# Patient Record
Sex: Male | Born: 1935 | Race: White | Hispanic: No | Smoking: Never smoker
Health system: Southern US, Community
[De-identification: ages and names within clinical notes are randomized; demographics above are authoritative.]

## PROBLEM LIST (undated history)

## (undated) DIAGNOSIS — M1990 Unspecified osteoarthritis, unspecified site: Secondary | ICD-10-CM

## (undated) DIAGNOSIS — I350 Nonrheumatic aortic (valve) stenosis: Secondary | ICD-10-CM

## (undated) DIAGNOSIS — I2510 Atherosclerotic heart disease of native coronary artery without angina pectoris: Secondary | ICD-10-CM

## (undated) HISTORY — DX: Unspecified osteoarthritis, unspecified site: M19.90

## (undated) HISTORY — PX: CHOLECYSTECTOMY: SHX55

## (undated) HISTORY — DX: Atherosclerotic heart disease of native coronary artery without angina pectoris: I25.10

## (undated) HISTORY — DX: Nonrheumatic aortic (valve) stenosis: I35.0

---

## 2011-05-20 DIAGNOSIS — H2510 Age-related nuclear cataract, unspecified eye: Secondary | ICD-10-CM | POA: Diagnosis not present

## 2011-05-25 DIAGNOSIS — Z86010 Personal history of colonic polyps: Secondary | ICD-10-CM | POA: Diagnosis not present

## 2011-05-25 DIAGNOSIS — K6289 Other specified diseases of anus and rectum: Secondary | ICD-10-CM | POA: Diagnosis not present

## 2011-07-10 DIAGNOSIS — S01309A Unspecified open wound of unspecified ear, initial encounter: Secondary | ICD-10-CM | POA: Diagnosis not present

## 2011-07-29 DIAGNOSIS — R5383 Other fatigue: Secondary | ICD-10-CM | POA: Diagnosis not present

## 2011-07-29 DIAGNOSIS — E7800 Pure hypercholesterolemia, unspecified: Secondary | ICD-10-CM | POA: Diagnosis not present

## 2011-07-29 DIAGNOSIS — R5381 Other malaise: Secondary | ICD-10-CM | POA: Diagnosis not present

## 2011-07-29 DIAGNOSIS — Z79899 Other long term (current) drug therapy: Secondary | ICD-10-CM | POA: Diagnosis not present

## 2011-07-29 DIAGNOSIS — D539 Nutritional anemia, unspecified: Secondary | ICD-10-CM | POA: Diagnosis not present

## 2011-08-02 DIAGNOSIS — R5381 Other malaise: Secondary | ICD-10-CM | POA: Diagnosis not present

## 2011-08-02 DIAGNOSIS — R5383 Other fatigue: Secondary | ICD-10-CM | POA: Diagnosis not present

## 2011-08-09 DIAGNOSIS — D510 Vitamin B12 deficiency anemia due to intrinsic factor deficiency: Secondary | ICD-10-CM | POA: Diagnosis not present

## 2011-08-12 DIAGNOSIS — H903 Sensorineural hearing loss, bilateral: Secondary | ICD-10-CM | POA: Diagnosis not present

## 2011-08-16 DIAGNOSIS — D510 Vitamin B12 deficiency anemia due to intrinsic factor deficiency: Secondary | ICD-10-CM | POA: Diagnosis not present

## 2011-08-23 DIAGNOSIS — D510 Vitamin B12 deficiency anemia due to intrinsic factor deficiency: Secondary | ICD-10-CM | POA: Diagnosis not present

## 2011-09-15 DIAGNOSIS — D510 Vitamin B12 deficiency anemia due to intrinsic factor deficiency: Secondary | ICD-10-CM | POA: Diagnosis not present

## 2011-10-11 DIAGNOSIS — M542 Cervicalgia: Secondary | ICD-10-CM | POA: Diagnosis not present

## 2011-10-11 DIAGNOSIS — R6889 Other general symptoms and signs: Secondary | ICD-10-CM | POA: Diagnosis not present

## 2011-10-11 DIAGNOSIS — D510 Vitamin B12 deficiency anemia due to intrinsic factor deficiency: Secondary | ICD-10-CM | POA: Diagnosis not present

## 2011-10-13 DIAGNOSIS — R509 Fever, unspecified: Secondary | ICD-10-CM | POA: Diagnosis not present

## 2011-10-18 DIAGNOSIS — H1045 Other chronic allergic conjunctivitis: Secondary | ICD-10-CM | POA: Diagnosis not present

## 2011-10-21 DIAGNOSIS — H5789 Other specified disorders of eye and adnexa: Secondary | ICD-10-CM | POA: Diagnosis not present

## 2011-11-09 DIAGNOSIS — D510 Vitamin B12 deficiency anemia due to intrinsic factor deficiency: Secondary | ICD-10-CM | POA: Diagnosis not present

## 2012-01-17 DIAGNOSIS — I6529 Occlusion and stenosis of unspecified carotid artery: Secondary | ICD-10-CM | POA: Diagnosis not present

## 2012-01-17 DIAGNOSIS — R6889 Other general symptoms and signs: Secondary | ICD-10-CM | POA: Diagnosis not present

## 2012-01-17 DIAGNOSIS — R918 Other nonspecific abnormal finding of lung field: Secondary | ICD-10-CM | POA: Diagnosis not present

## 2012-01-24 DIAGNOSIS — L570 Actinic keratosis: Secondary | ICD-10-CM | POA: Diagnosis not present

## 2012-01-24 DIAGNOSIS — D485 Neoplasm of uncertain behavior of skin: Secondary | ICD-10-CM | POA: Diagnosis not present

## 2012-01-24 DIAGNOSIS — L821 Other seborrheic keratosis: Secondary | ICD-10-CM | POA: Diagnosis not present

## 2012-01-24 DIAGNOSIS — T148 Other injury of unspecified body region: Secondary | ICD-10-CM | POA: Diagnosis not present

## 2012-02-01 DIAGNOSIS — Z23 Encounter for immunization: Secondary | ICD-10-CM | POA: Diagnosis not present

## 2012-02-21 DIAGNOSIS — C4330 Malignant melanoma of unspecified part of face: Secondary | ICD-10-CM | POA: Diagnosis not present

## 2012-05-16 DIAGNOSIS — D510 Vitamin B12 deficiency anemia due to intrinsic factor deficiency: Secondary | ICD-10-CM | POA: Diagnosis not present

## 2012-05-17 DIAGNOSIS — Z85820 Personal history of malignant melanoma of skin: Secondary | ICD-10-CM | POA: Diagnosis not present

## 2012-05-17 DIAGNOSIS — L821 Other seborrheic keratosis: Secondary | ICD-10-CM | POA: Diagnosis not present

## 2012-05-17 DIAGNOSIS — L570 Actinic keratosis: Secondary | ICD-10-CM | POA: Diagnosis not present

## 2012-05-19 DIAGNOSIS — H2510 Age-related nuclear cataract, unspecified eye: Secondary | ICD-10-CM

## 2012-05-19 HISTORY — DX: Age-related nuclear cataract, unspecified eye: H25.10

## 2012-06-09 DIAGNOSIS — K6289 Other specified diseases of anus and rectum: Secondary | ICD-10-CM | POA: Diagnosis not present

## 2012-06-09 DIAGNOSIS — K5730 Diverticulosis of large intestine without perforation or abscess without bleeding: Secondary | ICD-10-CM | POA: Diagnosis not present

## 2012-06-09 DIAGNOSIS — Z86010 Personal history of colonic polyps: Secondary | ICD-10-CM | POA: Diagnosis not present

## 2012-06-14 DIAGNOSIS — D510 Vitamin B12 deficiency anemia due to intrinsic factor deficiency: Secondary | ICD-10-CM | POA: Diagnosis not present

## 2012-07-12 DIAGNOSIS — E7800 Pure hypercholesterolemia, unspecified: Secondary | ICD-10-CM | POA: Diagnosis not present

## 2012-07-12 DIAGNOSIS — K219 Gastro-esophageal reflux disease without esophagitis: Secondary | ICD-10-CM | POA: Diagnosis not present

## 2012-07-12 DIAGNOSIS — Z136 Encounter for screening for cardiovascular disorders: Secondary | ICD-10-CM | POA: Diagnosis not present

## 2012-07-12 DIAGNOSIS — Z0000 Encounter for general adult medical examination without abnormal findings: Secondary | ICD-10-CM | POA: Diagnosis not present

## 2012-08-17 DIAGNOSIS — L570 Actinic keratosis: Secondary | ICD-10-CM | POA: Diagnosis not present

## 2012-08-17 DIAGNOSIS — L821 Other seborrheic keratosis: Secondary | ICD-10-CM | POA: Diagnosis not present

## 2012-08-31 DIAGNOSIS — D510 Vitamin B12 deficiency anemia due to intrinsic factor deficiency: Secondary | ICD-10-CM | POA: Diagnosis not present

## 2012-11-01 DIAGNOSIS — D510 Vitamin B12 deficiency anemia due to intrinsic factor deficiency: Secondary | ICD-10-CM | POA: Diagnosis not present

## 2012-12-14 DIAGNOSIS — M25569 Pain in unspecified knee: Secondary | ICD-10-CM | POA: Diagnosis not present

## 2012-12-18 DIAGNOSIS — M949 Disorder of cartilage, unspecified: Secondary | ICD-10-CM | POA: Diagnosis not present

## 2012-12-18 DIAGNOSIS — M899 Disorder of bone, unspecified: Secondary | ICD-10-CM | POA: Diagnosis not present

## 2013-01-30 DIAGNOSIS — D510 Vitamin B12 deficiency anemia due to intrinsic factor deficiency: Secondary | ICD-10-CM | POA: Diagnosis not present

## 2013-02-08 DIAGNOSIS — Z23 Encounter for immunization: Secondary | ICD-10-CM | POA: Diagnosis not present

## 2013-02-19 DIAGNOSIS — L570 Actinic keratosis: Secondary | ICD-10-CM | POA: Diagnosis not present

## 2013-02-19 DIAGNOSIS — L821 Other seborrheic keratosis: Secondary | ICD-10-CM | POA: Diagnosis not present

## 2013-03-28 DIAGNOSIS — D510 Vitamin B12 deficiency anemia due to intrinsic factor deficiency: Secondary | ICD-10-CM | POA: Diagnosis not present

## 2013-04-26 DIAGNOSIS — D510 Vitamin B12 deficiency anemia due to intrinsic factor deficiency: Secondary | ICD-10-CM | POA: Diagnosis not present

## 2013-05-18 DIAGNOSIS — H2510 Age-related nuclear cataract, unspecified eye: Secondary | ICD-10-CM | POA: Diagnosis not present

## 2013-05-22 DIAGNOSIS — K5730 Diverticulosis of large intestine without perforation or abscess without bleeding: Secondary | ICD-10-CM | POA: Diagnosis not present

## 2013-05-22 DIAGNOSIS — D129 Benign neoplasm of anus and anal canal: Secondary | ICD-10-CM | POA: Diagnosis not present

## 2013-05-22 DIAGNOSIS — D128 Benign neoplasm of rectum: Secondary | ICD-10-CM | POA: Diagnosis not present

## 2013-05-22 DIAGNOSIS — K6289 Other specified diseases of anus and rectum: Secondary | ICD-10-CM | POA: Diagnosis not present

## 2013-05-22 DIAGNOSIS — Z86010 Personal history of colonic polyps: Secondary | ICD-10-CM | POA: Diagnosis not present

## 2013-05-22 DIAGNOSIS — K621 Rectal polyp: Secondary | ICD-10-CM | POA: Diagnosis not present

## 2013-05-22 DIAGNOSIS — K620 Anal polyp: Secondary | ICD-10-CM | POA: Diagnosis not present

## 2013-06-05 DIAGNOSIS — J069 Acute upper respiratory infection, unspecified: Secondary | ICD-10-CM | POA: Diagnosis not present

## 2013-07-17 DIAGNOSIS — E7800 Pure hypercholesterolemia, unspecified: Secondary | ICD-10-CM | POA: Diagnosis not present

## 2013-07-17 DIAGNOSIS — G4700 Insomnia, unspecified: Secondary | ICD-10-CM | POA: Diagnosis not present

## 2013-07-17 DIAGNOSIS — K4090 Unilateral inguinal hernia, without obstruction or gangrene, not specified as recurrent: Secondary | ICD-10-CM | POA: Diagnosis not present

## 2013-07-17 DIAGNOSIS — E559 Vitamin D deficiency, unspecified: Secondary | ICD-10-CM | POA: Diagnosis not present

## 2013-07-17 DIAGNOSIS — Z0000 Encounter for general adult medical examination without abnormal findings: Secondary | ICD-10-CM | POA: Diagnosis not present

## 2013-07-17 DIAGNOSIS — D510 Vitamin B12 deficiency anemia due to intrinsic factor deficiency: Secondary | ICD-10-CM | POA: Diagnosis not present

## 2013-07-23 DIAGNOSIS — D510 Vitamin B12 deficiency anemia due to intrinsic factor deficiency: Secondary | ICD-10-CM | POA: Diagnosis not present

## 2013-08-02 DIAGNOSIS — Z23 Encounter for immunization: Secondary | ICD-10-CM | POA: Diagnosis not present

## 2013-08-15 DIAGNOSIS — L570 Actinic keratosis: Secondary | ICD-10-CM | POA: Diagnosis not present

## 2013-08-21 DIAGNOSIS — D510 Vitamin B12 deficiency anemia due to intrinsic factor deficiency: Secondary | ICD-10-CM | POA: Diagnosis not present

## 2013-09-19 DIAGNOSIS — L578 Other skin changes due to chronic exposure to nonionizing radiation: Secondary | ICD-10-CM | POA: Diagnosis not present

## 2013-09-19 DIAGNOSIS — L821 Other seborrheic keratosis: Secondary | ICD-10-CM | POA: Diagnosis not present

## 2013-10-18 DIAGNOSIS — D510 Vitamin B12 deficiency anemia due to intrinsic factor deficiency: Secondary | ICD-10-CM | POA: Diagnosis not present

## 2013-11-19 DIAGNOSIS — D510 Vitamin B12 deficiency anemia due to intrinsic factor deficiency: Secondary | ICD-10-CM | POA: Diagnosis not present

## 2013-12-10 DIAGNOSIS — E559 Vitamin D deficiency, unspecified: Secondary | ICD-10-CM | POA: Diagnosis not present

## 2013-12-10 DIAGNOSIS — M7660 Achilles tendinitis, unspecified leg: Secondary | ICD-10-CM | POA: Diagnosis not present

## 2014-01-23 DIAGNOSIS — D510 Vitamin B12 deficiency anemia due to intrinsic factor deficiency: Secondary | ICD-10-CM | POA: Diagnosis not present

## 2014-02-12 DIAGNOSIS — M7660 Achilles tendinitis, unspecified leg: Secondary | ICD-10-CM | POA: Diagnosis not present

## 2014-02-12 DIAGNOSIS — Z23 Encounter for immunization: Secondary | ICD-10-CM | POA: Diagnosis not present

## 2014-02-12 DIAGNOSIS — G4700 Insomnia, unspecified: Secondary | ICD-10-CM | POA: Diagnosis not present

## 2014-02-27 DIAGNOSIS — D510 Vitamin B12 deficiency anemia due to intrinsic factor deficiency: Secondary | ICD-10-CM | POA: Diagnosis not present

## 2014-03-19 DIAGNOSIS — C4442 Squamous cell carcinoma of skin of scalp and neck: Secondary | ICD-10-CM | POA: Diagnosis not present

## 2014-03-19 DIAGNOSIS — L821 Other seborrheic keratosis: Secondary | ICD-10-CM | POA: Diagnosis not present

## 2014-03-19 DIAGNOSIS — D225 Melanocytic nevi of trunk: Secondary | ICD-10-CM | POA: Diagnosis not present

## 2014-03-19 DIAGNOSIS — L570 Actinic keratosis: Secondary | ICD-10-CM | POA: Diagnosis not present

## 2014-03-19 DIAGNOSIS — L814 Other melanin hyperpigmentation: Secondary | ICD-10-CM | POA: Diagnosis not present

## 2014-03-27 DIAGNOSIS — S86012A Strain of left Achilles tendon, initial encounter: Secondary | ICD-10-CM | POA: Diagnosis not present

## 2014-04-01 DIAGNOSIS — J329 Chronic sinusitis, unspecified: Secondary | ICD-10-CM | POA: Diagnosis not present

## 2014-04-08 DIAGNOSIS — J029 Acute pharyngitis, unspecified: Secondary | ICD-10-CM | POA: Diagnosis not present

## 2014-04-11 DIAGNOSIS — R293 Abnormal posture: Secondary | ICD-10-CM | POA: Diagnosis not present

## 2014-04-11 DIAGNOSIS — R2689 Other abnormalities of gait and mobility: Secondary | ICD-10-CM | POA: Diagnosis not present

## 2014-04-11 DIAGNOSIS — S86012A Strain of left Achilles tendon, initial encounter: Secondary | ICD-10-CM | POA: Diagnosis not present

## 2014-04-11 DIAGNOSIS — M25672 Stiffness of left ankle, not elsewhere classified: Secondary | ICD-10-CM | POA: Diagnosis not present

## 2014-04-11 DIAGNOSIS — M25572 Pain in left ankle and joints of left foot: Secondary | ICD-10-CM | POA: Diagnosis not present

## 2014-04-11 DIAGNOSIS — M6281 Muscle weakness (generalized): Secondary | ICD-10-CM | POA: Diagnosis not present

## 2014-04-16 DIAGNOSIS — S86012A Strain of left Achilles tendon, initial encounter: Secondary | ICD-10-CM | POA: Diagnosis not present

## 2014-04-16 DIAGNOSIS — M25672 Stiffness of left ankle, not elsewhere classified: Secondary | ICD-10-CM | POA: Diagnosis not present

## 2014-04-16 DIAGNOSIS — R293 Abnormal posture: Secondary | ICD-10-CM | POA: Diagnosis not present

## 2014-04-16 DIAGNOSIS — M25572 Pain in left ankle and joints of left foot: Secondary | ICD-10-CM | POA: Diagnosis not present

## 2014-04-16 DIAGNOSIS — M6281 Muscle weakness (generalized): Secondary | ICD-10-CM | POA: Diagnosis not present

## 2014-04-16 DIAGNOSIS — R2689 Other abnormalities of gait and mobility: Secondary | ICD-10-CM | POA: Diagnosis not present

## 2014-04-19 DIAGNOSIS — M25572 Pain in left ankle and joints of left foot: Secondary | ICD-10-CM | POA: Diagnosis not present

## 2014-04-19 DIAGNOSIS — R2689 Other abnormalities of gait and mobility: Secondary | ICD-10-CM | POA: Diagnosis not present

## 2014-04-19 DIAGNOSIS — M25672 Stiffness of left ankle, not elsewhere classified: Secondary | ICD-10-CM | POA: Diagnosis not present

## 2014-04-19 DIAGNOSIS — R293 Abnormal posture: Secondary | ICD-10-CM | POA: Diagnosis not present

## 2014-04-19 DIAGNOSIS — M6281 Muscle weakness (generalized): Secondary | ICD-10-CM | POA: Diagnosis not present

## 2014-04-19 DIAGNOSIS — S86012A Strain of left Achilles tendon, initial encounter: Secondary | ICD-10-CM | POA: Diagnosis not present

## 2014-04-24 DIAGNOSIS — M25572 Pain in left ankle and joints of left foot: Secondary | ICD-10-CM | POA: Diagnosis not present

## 2014-04-24 DIAGNOSIS — S86012A Strain of left Achilles tendon, initial encounter: Secondary | ICD-10-CM | POA: Diagnosis not present

## 2014-04-24 DIAGNOSIS — M6281 Muscle weakness (generalized): Secondary | ICD-10-CM | POA: Diagnosis not present

## 2014-04-24 DIAGNOSIS — M25672 Stiffness of left ankle, not elsewhere classified: Secondary | ICD-10-CM | POA: Diagnosis not present

## 2014-04-24 DIAGNOSIS — R293 Abnormal posture: Secondary | ICD-10-CM | POA: Diagnosis not present

## 2014-04-24 DIAGNOSIS — R2689 Other abnormalities of gait and mobility: Secondary | ICD-10-CM | POA: Diagnosis not present

## 2014-04-26 DIAGNOSIS — S86012A Strain of left Achilles tendon, initial encounter: Secondary | ICD-10-CM | POA: Diagnosis not present

## 2014-04-26 DIAGNOSIS — M25672 Stiffness of left ankle, not elsewhere classified: Secondary | ICD-10-CM | POA: Diagnosis not present

## 2014-04-26 DIAGNOSIS — M25572 Pain in left ankle and joints of left foot: Secondary | ICD-10-CM | POA: Diagnosis not present

## 2014-04-26 DIAGNOSIS — R293 Abnormal posture: Secondary | ICD-10-CM | POA: Diagnosis not present

## 2014-04-26 DIAGNOSIS — M6281 Muscle weakness (generalized): Secondary | ICD-10-CM | POA: Diagnosis not present

## 2014-04-26 DIAGNOSIS — R2689 Other abnormalities of gait and mobility: Secondary | ICD-10-CM | POA: Diagnosis not present

## 2014-04-29 DIAGNOSIS — R293 Abnormal posture: Secondary | ICD-10-CM | POA: Diagnosis not present

## 2014-04-29 DIAGNOSIS — M25572 Pain in left ankle and joints of left foot: Secondary | ICD-10-CM | POA: Diagnosis not present

## 2014-04-29 DIAGNOSIS — M25672 Stiffness of left ankle, not elsewhere classified: Secondary | ICD-10-CM | POA: Diagnosis not present

## 2014-04-29 DIAGNOSIS — R2689 Other abnormalities of gait and mobility: Secondary | ICD-10-CM | POA: Diagnosis not present

## 2014-04-29 DIAGNOSIS — M6281 Muscle weakness (generalized): Secondary | ICD-10-CM | POA: Diagnosis not present

## 2014-04-29 DIAGNOSIS — S86012A Strain of left Achilles tendon, initial encounter: Secondary | ICD-10-CM | POA: Diagnosis not present

## 2014-05-01 DIAGNOSIS — D510 Vitamin B12 deficiency anemia due to intrinsic factor deficiency: Secondary | ICD-10-CM | POA: Diagnosis not present

## 2014-05-14 DIAGNOSIS — M25672 Stiffness of left ankle, not elsewhere classified: Secondary | ICD-10-CM | POA: Diagnosis not present

## 2014-05-14 DIAGNOSIS — S86012D Strain of left Achilles tendon, subsequent encounter: Secondary | ICD-10-CM | POA: Diagnosis not present

## 2014-05-14 DIAGNOSIS — M6281 Muscle weakness (generalized): Secondary | ICD-10-CM | POA: Diagnosis not present

## 2014-05-14 DIAGNOSIS — R2689 Other abnormalities of gait and mobility: Secondary | ICD-10-CM | POA: Diagnosis not present

## 2014-05-14 DIAGNOSIS — M25572 Pain in left ankle and joints of left foot: Secondary | ICD-10-CM | POA: Diagnosis not present

## 2014-05-14 DIAGNOSIS — R293 Abnormal posture: Secondary | ICD-10-CM | POA: Diagnosis not present

## 2014-05-16 DIAGNOSIS — S86012D Strain of left Achilles tendon, subsequent encounter: Secondary | ICD-10-CM | POA: Diagnosis not present

## 2014-05-16 DIAGNOSIS — M25572 Pain in left ankle and joints of left foot: Secondary | ICD-10-CM | POA: Diagnosis not present

## 2014-05-16 DIAGNOSIS — R293 Abnormal posture: Secondary | ICD-10-CM | POA: Diagnosis not present

## 2014-05-16 DIAGNOSIS — M6281 Muscle weakness (generalized): Secondary | ICD-10-CM | POA: Diagnosis not present

## 2014-05-16 DIAGNOSIS — M25672 Stiffness of left ankle, not elsewhere classified: Secondary | ICD-10-CM | POA: Diagnosis not present

## 2014-05-16 DIAGNOSIS — R2689 Other abnormalities of gait and mobility: Secondary | ICD-10-CM | POA: Diagnosis not present

## 2014-05-22 DIAGNOSIS — Z1211 Encounter for screening for malignant neoplasm of colon: Secondary | ICD-10-CM | POA: Diagnosis not present

## 2014-05-22 DIAGNOSIS — Z86010 Personal history of colonic polyps: Secondary | ICD-10-CM | POA: Diagnosis not present

## 2014-05-22 DIAGNOSIS — K5730 Diverticulosis of large intestine without perforation or abscess without bleeding: Secondary | ICD-10-CM | POA: Diagnosis not present

## 2014-05-22 DIAGNOSIS — K649 Unspecified hemorrhoids: Secondary | ICD-10-CM | POA: Diagnosis not present

## 2014-05-23 DIAGNOSIS — S86012D Strain of left Achilles tendon, subsequent encounter: Secondary | ICD-10-CM | POA: Diagnosis not present

## 2014-05-23 DIAGNOSIS — R2689 Other abnormalities of gait and mobility: Secondary | ICD-10-CM | POA: Diagnosis not present

## 2014-05-23 DIAGNOSIS — R293 Abnormal posture: Secondary | ICD-10-CM | POA: Diagnosis not present

## 2014-05-23 DIAGNOSIS — M25672 Stiffness of left ankle, not elsewhere classified: Secondary | ICD-10-CM | POA: Diagnosis not present

## 2014-05-23 DIAGNOSIS — M6281 Muscle weakness (generalized): Secondary | ICD-10-CM | POA: Diagnosis not present

## 2014-05-23 DIAGNOSIS — M25572 Pain in left ankle and joints of left foot: Secondary | ICD-10-CM | POA: Diagnosis not present

## 2014-05-30 DIAGNOSIS — M25572 Pain in left ankle and joints of left foot: Secondary | ICD-10-CM | POA: Diagnosis not present

## 2014-05-30 DIAGNOSIS — R293 Abnormal posture: Secondary | ICD-10-CM | POA: Diagnosis not present

## 2014-05-30 DIAGNOSIS — R2689 Other abnormalities of gait and mobility: Secondary | ICD-10-CM | POA: Diagnosis not present

## 2014-05-30 DIAGNOSIS — S86012D Strain of left Achilles tendon, subsequent encounter: Secondary | ICD-10-CM | POA: Diagnosis not present

## 2014-05-30 DIAGNOSIS — M6281 Muscle weakness (generalized): Secondary | ICD-10-CM | POA: Diagnosis not present

## 2014-05-30 DIAGNOSIS — M25672 Stiffness of left ankle, not elsewhere classified: Secondary | ICD-10-CM | POA: Diagnosis not present

## 2014-06-03 DIAGNOSIS — M25672 Stiffness of left ankle, not elsewhere classified: Secondary | ICD-10-CM | POA: Diagnosis not present

## 2014-06-03 DIAGNOSIS — R293 Abnormal posture: Secondary | ICD-10-CM | POA: Diagnosis not present

## 2014-06-03 DIAGNOSIS — R2689 Other abnormalities of gait and mobility: Secondary | ICD-10-CM | POA: Diagnosis not present

## 2014-06-03 DIAGNOSIS — M25572 Pain in left ankle and joints of left foot: Secondary | ICD-10-CM | POA: Diagnosis not present

## 2014-06-03 DIAGNOSIS — S86012D Strain of left Achilles tendon, subsequent encounter: Secondary | ICD-10-CM | POA: Diagnosis not present

## 2014-06-03 DIAGNOSIS — M6281 Muscle weakness (generalized): Secondary | ICD-10-CM | POA: Diagnosis not present

## 2014-06-04 DIAGNOSIS — D510 Vitamin B12 deficiency anemia due to intrinsic factor deficiency: Secondary | ICD-10-CM | POA: Diagnosis not present

## 2014-06-06 DIAGNOSIS — M25672 Stiffness of left ankle, not elsewhere classified: Secondary | ICD-10-CM | POA: Diagnosis not present

## 2014-06-06 DIAGNOSIS — M25572 Pain in left ankle and joints of left foot: Secondary | ICD-10-CM | POA: Diagnosis not present

## 2014-06-06 DIAGNOSIS — M6281 Muscle weakness (generalized): Secondary | ICD-10-CM | POA: Diagnosis not present

## 2014-06-06 DIAGNOSIS — R293 Abnormal posture: Secondary | ICD-10-CM | POA: Diagnosis not present

## 2014-06-06 DIAGNOSIS — S86012D Strain of left Achilles tendon, subsequent encounter: Secondary | ICD-10-CM | POA: Diagnosis not present

## 2014-06-06 DIAGNOSIS — R2689 Other abnormalities of gait and mobility: Secondary | ICD-10-CM | POA: Diagnosis not present

## 2014-06-10 DIAGNOSIS — S86012D Strain of left Achilles tendon, subsequent encounter: Secondary | ICD-10-CM | POA: Diagnosis not present

## 2014-06-10 DIAGNOSIS — M25672 Stiffness of left ankle, not elsewhere classified: Secondary | ICD-10-CM | POA: Diagnosis not present

## 2014-06-10 DIAGNOSIS — M6281 Muscle weakness (generalized): Secondary | ICD-10-CM | POA: Diagnosis not present

## 2014-06-10 DIAGNOSIS — R2689 Other abnormalities of gait and mobility: Secondary | ICD-10-CM | POA: Diagnosis not present

## 2014-06-10 DIAGNOSIS — R293 Abnormal posture: Secondary | ICD-10-CM | POA: Diagnosis not present

## 2014-06-13 DIAGNOSIS — R293 Abnormal posture: Secondary | ICD-10-CM | POA: Diagnosis not present

## 2014-06-13 DIAGNOSIS — M25672 Stiffness of left ankle, not elsewhere classified: Secondary | ICD-10-CM | POA: Diagnosis not present

## 2014-06-13 DIAGNOSIS — R2689 Other abnormalities of gait and mobility: Secondary | ICD-10-CM | POA: Diagnosis not present

## 2014-06-13 DIAGNOSIS — M6281 Muscle weakness (generalized): Secondary | ICD-10-CM | POA: Diagnosis not present

## 2014-06-13 DIAGNOSIS — S86012D Strain of left Achilles tendon, subsequent encounter: Secondary | ICD-10-CM | POA: Diagnosis not present

## 2014-06-17 DIAGNOSIS — R2689 Other abnormalities of gait and mobility: Secondary | ICD-10-CM | POA: Diagnosis not present

## 2014-06-17 DIAGNOSIS — M6281 Muscle weakness (generalized): Secondary | ICD-10-CM | POA: Diagnosis not present

## 2014-06-17 DIAGNOSIS — S86012D Strain of left Achilles tendon, subsequent encounter: Secondary | ICD-10-CM | POA: Diagnosis not present

## 2014-06-17 DIAGNOSIS — R293 Abnormal posture: Secondary | ICD-10-CM | POA: Diagnosis not present

## 2014-06-17 DIAGNOSIS — M25672 Stiffness of left ankle, not elsewhere classified: Secondary | ICD-10-CM | POA: Diagnosis not present

## 2014-06-19 DIAGNOSIS — S86012D Strain of left Achilles tendon, subsequent encounter: Secondary | ICD-10-CM | POA: Diagnosis not present

## 2014-06-20 DIAGNOSIS — M25672 Stiffness of left ankle, not elsewhere classified: Secondary | ICD-10-CM | POA: Diagnosis not present

## 2014-06-20 DIAGNOSIS — M6281 Muscle weakness (generalized): Secondary | ICD-10-CM | POA: Diagnosis not present

## 2014-06-20 DIAGNOSIS — R2689 Other abnormalities of gait and mobility: Secondary | ICD-10-CM | POA: Diagnosis not present

## 2014-06-20 DIAGNOSIS — S86012D Strain of left Achilles tendon, subsequent encounter: Secondary | ICD-10-CM | POA: Diagnosis not present

## 2014-06-20 DIAGNOSIS — R293 Abnormal posture: Secondary | ICD-10-CM | POA: Diagnosis not present

## 2014-06-21 DIAGNOSIS — S86012D Strain of left Achilles tendon, subsequent encounter: Secondary | ICD-10-CM | POA: Diagnosis not present

## 2014-06-21 DIAGNOSIS — R2689 Other abnormalities of gait and mobility: Secondary | ICD-10-CM | POA: Diagnosis not present

## 2014-06-21 DIAGNOSIS — M25672 Stiffness of left ankle, not elsewhere classified: Secondary | ICD-10-CM | POA: Diagnosis not present

## 2014-06-21 DIAGNOSIS — R293 Abnormal posture: Secondary | ICD-10-CM | POA: Diagnosis not present

## 2014-06-21 DIAGNOSIS — M6281 Muscle weakness (generalized): Secondary | ICD-10-CM | POA: Diagnosis not present

## 2014-06-27 DIAGNOSIS — R293 Abnormal posture: Secondary | ICD-10-CM | POA: Diagnosis not present

## 2014-06-27 DIAGNOSIS — M25672 Stiffness of left ankle, not elsewhere classified: Secondary | ICD-10-CM | POA: Diagnosis not present

## 2014-06-27 DIAGNOSIS — R2689 Other abnormalities of gait and mobility: Secondary | ICD-10-CM | POA: Diagnosis not present

## 2014-06-27 DIAGNOSIS — M6281 Muscle weakness (generalized): Secondary | ICD-10-CM | POA: Diagnosis not present

## 2014-06-27 DIAGNOSIS — S86012D Strain of left Achilles tendon, subsequent encounter: Secondary | ICD-10-CM | POA: Diagnosis not present

## 2014-07-01 DIAGNOSIS — Z85828 Personal history of other malignant neoplasm of skin: Secondary | ICD-10-CM | POA: Diagnosis not present

## 2014-07-01 DIAGNOSIS — L821 Other seborrheic keratosis: Secondary | ICD-10-CM | POA: Diagnosis not present

## 2014-07-01 DIAGNOSIS — L570 Actinic keratosis: Secondary | ICD-10-CM | POA: Diagnosis not present

## 2014-07-01 DIAGNOSIS — Z08 Encounter for follow-up examination after completed treatment for malignant neoplasm: Secondary | ICD-10-CM | POA: Diagnosis not present

## 2014-07-02 DIAGNOSIS — R2689 Other abnormalities of gait and mobility: Secondary | ICD-10-CM | POA: Diagnosis not present

## 2014-07-02 DIAGNOSIS — R293 Abnormal posture: Secondary | ICD-10-CM | POA: Diagnosis not present

## 2014-07-02 DIAGNOSIS — S86012D Strain of left Achilles tendon, subsequent encounter: Secondary | ICD-10-CM | POA: Diagnosis not present

## 2014-07-02 DIAGNOSIS — M6281 Muscle weakness (generalized): Secondary | ICD-10-CM | POA: Diagnosis not present

## 2014-07-02 DIAGNOSIS — M25672 Stiffness of left ankle, not elsewhere classified: Secondary | ICD-10-CM | POA: Diagnosis not present

## 2014-07-04 DIAGNOSIS — S86012D Strain of left Achilles tendon, subsequent encounter: Secondary | ICD-10-CM | POA: Diagnosis not present

## 2014-07-04 DIAGNOSIS — M25672 Stiffness of left ankle, not elsewhere classified: Secondary | ICD-10-CM | POA: Diagnosis not present

## 2014-07-04 DIAGNOSIS — R2689 Other abnormalities of gait and mobility: Secondary | ICD-10-CM | POA: Diagnosis not present

## 2014-07-04 DIAGNOSIS — M6281 Muscle weakness (generalized): Secondary | ICD-10-CM | POA: Diagnosis not present

## 2014-07-04 DIAGNOSIS — R293 Abnormal posture: Secondary | ICD-10-CM | POA: Diagnosis not present

## 2014-07-05 DIAGNOSIS — H2513 Age-related nuclear cataract, bilateral: Secondary | ICD-10-CM | POA: Diagnosis not present

## 2014-07-08 DIAGNOSIS — S86012D Strain of left Achilles tendon, subsequent encounter: Secondary | ICD-10-CM | POA: Diagnosis not present

## 2014-07-08 DIAGNOSIS — R293 Abnormal posture: Secondary | ICD-10-CM | POA: Diagnosis not present

## 2014-07-08 DIAGNOSIS — M6281 Muscle weakness (generalized): Secondary | ICD-10-CM | POA: Diagnosis not present

## 2014-07-08 DIAGNOSIS — M25672 Stiffness of left ankle, not elsewhere classified: Secondary | ICD-10-CM | POA: Diagnosis not present

## 2014-07-08 DIAGNOSIS — R2689 Other abnormalities of gait and mobility: Secondary | ICD-10-CM | POA: Diagnosis not present

## 2014-07-11 DIAGNOSIS — S86012D Strain of left Achilles tendon, subsequent encounter: Secondary | ICD-10-CM | POA: Diagnosis not present

## 2014-07-11 DIAGNOSIS — M25572 Pain in left ankle and joints of left foot: Secondary | ICD-10-CM | POA: Diagnosis not present

## 2014-07-11 DIAGNOSIS — M6281 Muscle weakness (generalized): Secondary | ICD-10-CM | POA: Diagnosis not present

## 2014-07-11 DIAGNOSIS — M25672 Stiffness of left ankle, not elsewhere classified: Secondary | ICD-10-CM | POA: Diagnosis not present

## 2014-07-11 DIAGNOSIS — R2689 Other abnormalities of gait and mobility: Secondary | ICD-10-CM | POA: Diagnosis not present

## 2014-07-11 DIAGNOSIS — R293 Abnormal posture: Secondary | ICD-10-CM | POA: Diagnosis not present

## 2014-07-16 DIAGNOSIS — S86012D Strain of left Achilles tendon, subsequent encounter: Secondary | ICD-10-CM | POA: Diagnosis not present

## 2014-07-16 DIAGNOSIS — M25672 Stiffness of left ankle, not elsewhere classified: Secondary | ICD-10-CM | POA: Diagnosis not present

## 2014-07-16 DIAGNOSIS — R2689 Other abnormalities of gait and mobility: Secondary | ICD-10-CM | POA: Diagnosis not present

## 2014-07-16 DIAGNOSIS — R293 Abnormal posture: Secondary | ICD-10-CM | POA: Diagnosis not present

## 2014-07-16 DIAGNOSIS — M6281 Muscle weakness (generalized): Secondary | ICD-10-CM | POA: Diagnosis not present

## 2014-07-16 DIAGNOSIS — M25572 Pain in left ankle and joints of left foot: Secondary | ICD-10-CM | POA: Diagnosis not present

## 2014-07-18 DIAGNOSIS — R293 Abnormal posture: Secondary | ICD-10-CM | POA: Diagnosis not present

## 2014-07-18 DIAGNOSIS — R2689 Other abnormalities of gait and mobility: Secondary | ICD-10-CM | POA: Diagnosis not present

## 2014-07-18 DIAGNOSIS — M6281 Muscle weakness (generalized): Secondary | ICD-10-CM | POA: Diagnosis not present

## 2014-07-18 DIAGNOSIS — M25672 Stiffness of left ankle, not elsewhere classified: Secondary | ICD-10-CM | POA: Diagnosis not present

## 2014-07-18 DIAGNOSIS — S86012D Strain of left Achilles tendon, subsequent encounter: Secondary | ICD-10-CM | POA: Diagnosis not present

## 2014-07-18 DIAGNOSIS — M25572 Pain in left ankle and joints of left foot: Secondary | ICD-10-CM | POA: Diagnosis not present

## 2014-07-24 DIAGNOSIS — S86012D Strain of left Achilles tendon, subsequent encounter: Secondary | ICD-10-CM | POA: Diagnosis not present

## 2014-07-24 DIAGNOSIS — M6281 Muscle weakness (generalized): Secondary | ICD-10-CM | POA: Diagnosis not present

## 2014-07-24 DIAGNOSIS — R2689 Other abnormalities of gait and mobility: Secondary | ICD-10-CM | POA: Diagnosis not present

## 2014-07-24 DIAGNOSIS — M25572 Pain in left ankle and joints of left foot: Secondary | ICD-10-CM | POA: Diagnosis not present

## 2014-07-24 DIAGNOSIS — R293 Abnormal posture: Secondary | ICD-10-CM | POA: Diagnosis not present

## 2014-07-24 DIAGNOSIS — M25672 Stiffness of left ankle, not elsewhere classified: Secondary | ICD-10-CM | POA: Diagnosis not present

## 2014-07-25 DIAGNOSIS — R293 Abnormal posture: Secondary | ICD-10-CM | POA: Diagnosis not present

## 2014-07-25 DIAGNOSIS — M25672 Stiffness of left ankle, not elsewhere classified: Secondary | ICD-10-CM | POA: Diagnosis not present

## 2014-07-25 DIAGNOSIS — R2689 Other abnormalities of gait and mobility: Secondary | ICD-10-CM | POA: Diagnosis not present

## 2014-07-25 DIAGNOSIS — M25572 Pain in left ankle and joints of left foot: Secondary | ICD-10-CM | POA: Diagnosis not present

## 2014-07-25 DIAGNOSIS — S86012D Strain of left Achilles tendon, subsequent encounter: Secondary | ICD-10-CM | POA: Diagnosis not present

## 2014-07-25 DIAGNOSIS — M6281 Muscle weakness (generalized): Secondary | ICD-10-CM | POA: Diagnosis not present

## 2014-08-20 DIAGNOSIS — Z79899 Other long term (current) drug therapy: Secondary | ICD-10-CM | POA: Diagnosis not present

## 2014-08-20 DIAGNOSIS — R5383 Other fatigue: Secondary | ICD-10-CM | POA: Diagnosis not present

## 2014-08-20 DIAGNOSIS — E780 Pure hypercholesterolemia: Secondary | ICD-10-CM | POA: Diagnosis not present

## 2014-08-20 DIAGNOSIS — D509 Iron deficiency anemia, unspecified: Secondary | ICD-10-CM | POA: Diagnosis not present

## 2014-08-20 DIAGNOSIS — Z0000 Encounter for general adult medical examination without abnormal findings: Secondary | ICD-10-CM | POA: Diagnosis not present

## 2014-08-20 DIAGNOSIS — Z23 Encounter for immunization: Secondary | ICD-10-CM | POA: Diagnosis not present

## 2014-08-20 DIAGNOSIS — E559 Vitamin D deficiency, unspecified: Secondary | ICD-10-CM | POA: Diagnosis not present

## 2014-08-20 DIAGNOSIS — I6529 Occlusion and stenosis of unspecified carotid artery: Secondary | ICD-10-CM | POA: Diagnosis not present

## 2014-08-20 DIAGNOSIS — G4700 Insomnia, unspecified: Secondary | ICD-10-CM | POA: Diagnosis not present

## 2014-08-28 DIAGNOSIS — M5431 Sciatica, right side: Secondary | ICD-10-CM | POA: Diagnosis not present

## 2014-09-04 DIAGNOSIS — I6529 Occlusion and stenosis of unspecified carotid artery: Secondary | ICD-10-CM | POA: Diagnosis not present

## 2014-09-04 DIAGNOSIS — I6523 Occlusion and stenosis of bilateral carotid arteries: Secondary | ICD-10-CM | POA: Diagnosis not present

## 2014-09-04 DIAGNOSIS — D510 Vitamin B12 deficiency anemia due to intrinsic factor deficiency: Secondary | ICD-10-CM | POA: Diagnosis not present

## 2014-09-06 DIAGNOSIS — J302 Other seasonal allergic rhinitis: Secondary | ICD-10-CM | POA: Diagnosis not present

## 2014-09-06 DIAGNOSIS — J40 Bronchitis, not specified as acute or chronic: Secondary | ICD-10-CM | POA: Diagnosis not present

## 2014-09-18 DIAGNOSIS — M25561 Pain in right knee: Secondary | ICD-10-CM | POA: Diagnosis not present

## 2014-09-18 DIAGNOSIS — M5431 Sciatica, right side: Secondary | ICD-10-CM | POA: Diagnosis not present

## 2014-11-08 DIAGNOSIS — D510 Vitamin B12 deficiency anemia due to intrinsic factor deficiency: Secondary | ICD-10-CM | POA: Diagnosis not present

## 2014-12-09 DIAGNOSIS — D510 Vitamin B12 deficiency anemia due to intrinsic factor deficiency: Secondary | ICD-10-CM | POA: Diagnosis not present

## 2015-01-14 DIAGNOSIS — D510 Vitamin B12 deficiency anemia due to intrinsic factor deficiency: Secondary | ICD-10-CM | POA: Diagnosis not present

## 2015-02-19 DIAGNOSIS — Z23 Encounter for immunization: Secondary | ICD-10-CM | POA: Diagnosis not present

## 2015-03-18 DIAGNOSIS — D510 Vitamin B12 deficiency anemia due to intrinsic factor deficiency: Secondary | ICD-10-CM | POA: Diagnosis not present

## 2015-05-20 DIAGNOSIS — D510 Vitamin B12 deficiency anemia due to intrinsic factor deficiency: Secondary | ICD-10-CM | POA: Diagnosis not present

## 2015-05-27 DIAGNOSIS — Z1389 Encounter for screening for other disorder: Secondary | ICD-10-CM | POA: Diagnosis not present

## 2015-05-27 DIAGNOSIS — M1990 Unspecified osteoarthritis, unspecified site: Secondary | ICD-10-CM | POA: Diagnosis not present

## 2015-05-27 DIAGNOSIS — M5431 Sciatica, right side: Secondary | ICD-10-CM | POA: Diagnosis not present

## 2015-06-09 DIAGNOSIS — R2689 Other abnormalities of gait and mobility: Secondary | ICD-10-CM | POA: Diagnosis not present

## 2015-06-09 DIAGNOSIS — M25551 Pain in right hip: Secondary | ICD-10-CM | POA: Diagnosis not present

## 2015-06-09 DIAGNOSIS — M6281 Muscle weakness (generalized): Secondary | ICD-10-CM | POA: Diagnosis not present

## 2015-06-12 DIAGNOSIS — M25551 Pain in right hip: Secondary | ICD-10-CM | POA: Diagnosis not present

## 2015-06-12 DIAGNOSIS — M6281 Muscle weakness (generalized): Secondary | ICD-10-CM | POA: Diagnosis not present

## 2015-06-12 DIAGNOSIS — R2689 Other abnormalities of gait and mobility: Secondary | ICD-10-CM | POA: Diagnosis not present

## 2015-06-16 DIAGNOSIS — R2689 Other abnormalities of gait and mobility: Secondary | ICD-10-CM | POA: Diagnosis not present

## 2015-06-16 DIAGNOSIS — M6281 Muscle weakness (generalized): Secondary | ICD-10-CM | POA: Diagnosis not present

## 2015-06-16 DIAGNOSIS — M25551 Pain in right hip: Secondary | ICD-10-CM | POA: Diagnosis not present

## 2015-06-17 DIAGNOSIS — L570 Actinic keratosis: Secondary | ICD-10-CM | POA: Diagnosis not present

## 2015-06-17 DIAGNOSIS — L821 Other seborrheic keratosis: Secondary | ICD-10-CM | POA: Diagnosis not present

## 2015-06-19 DIAGNOSIS — R2689 Other abnormalities of gait and mobility: Secondary | ICD-10-CM | POA: Diagnosis not present

## 2015-06-19 DIAGNOSIS — M6281 Muscle weakness (generalized): Secondary | ICD-10-CM | POA: Diagnosis not present

## 2015-06-19 DIAGNOSIS — M25551 Pain in right hip: Secondary | ICD-10-CM | POA: Diagnosis not present

## 2015-06-23 DIAGNOSIS — R2689 Other abnormalities of gait and mobility: Secondary | ICD-10-CM | POA: Diagnosis not present

## 2015-06-23 DIAGNOSIS — M25551 Pain in right hip: Secondary | ICD-10-CM | POA: Diagnosis not present

## 2015-06-23 DIAGNOSIS — M6281 Muscle weakness (generalized): Secondary | ICD-10-CM | POA: Diagnosis not present

## 2015-06-26 DIAGNOSIS — R2689 Other abnormalities of gait and mobility: Secondary | ICD-10-CM | POA: Diagnosis not present

## 2015-06-26 DIAGNOSIS — M25551 Pain in right hip: Secondary | ICD-10-CM | POA: Diagnosis not present

## 2015-06-26 DIAGNOSIS — M6281 Muscle weakness (generalized): Secondary | ICD-10-CM | POA: Diagnosis not present

## 2015-06-30 DIAGNOSIS — D510 Vitamin B12 deficiency anemia due to intrinsic factor deficiency: Secondary | ICD-10-CM | POA: Diagnosis not present

## 2015-07-01 DIAGNOSIS — M25551 Pain in right hip: Secondary | ICD-10-CM | POA: Diagnosis not present

## 2015-07-01 DIAGNOSIS — M6281 Muscle weakness (generalized): Secondary | ICD-10-CM | POA: Diagnosis not present

## 2015-07-01 DIAGNOSIS — R2689 Other abnormalities of gait and mobility: Secondary | ICD-10-CM | POA: Diagnosis not present

## 2015-07-07 DIAGNOSIS — J209 Acute bronchitis, unspecified: Secondary | ICD-10-CM | POA: Diagnosis not present

## 2015-07-07 DIAGNOSIS — M25551 Pain in right hip: Secondary | ICD-10-CM | POA: Diagnosis not present

## 2015-07-07 DIAGNOSIS — R2689 Other abnormalities of gait and mobility: Secondary | ICD-10-CM | POA: Diagnosis not present

## 2015-07-07 DIAGNOSIS — M6281 Muscle weakness (generalized): Secondary | ICD-10-CM | POA: Diagnosis not present

## 2015-07-10 DIAGNOSIS — R5383 Other fatigue: Secondary | ICD-10-CM | POA: Diagnosis not present

## 2015-07-10 DIAGNOSIS — G4700 Insomnia, unspecified: Secondary | ICD-10-CM | POA: Diagnosis not present

## 2015-07-10 DIAGNOSIS — J40 Bronchitis, not specified as acute or chronic: Secondary | ICD-10-CM | POA: Diagnosis not present

## 2015-07-15 DIAGNOSIS — M25551 Pain in right hip: Secondary | ICD-10-CM | POA: Diagnosis not present

## 2015-07-15 DIAGNOSIS — M6281 Muscle weakness (generalized): Secondary | ICD-10-CM | POA: Diagnosis not present

## 2015-07-15 DIAGNOSIS — R2689 Other abnormalities of gait and mobility: Secondary | ICD-10-CM | POA: Diagnosis not present

## 2015-07-18 DIAGNOSIS — H2513 Age-related nuclear cataract, bilateral: Secondary | ICD-10-CM | POA: Diagnosis not present

## 2015-07-30 DIAGNOSIS — D510 Vitamin B12 deficiency anemia due to intrinsic factor deficiency: Secondary | ICD-10-CM | POA: Diagnosis not present

## 2015-09-18 DIAGNOSIS — E785 Hyperlipidemia, unspecified: Secondary | ICD-10-CM | POA: Diagnosis not present

## 2015-09-18 DIAGNOSIS — E559 Vitamin D deficiency, unspecified: Secondary | ICD-10-CM | POA: Diagnosis not present

## 2015-09-18 DIAGNOSIS — Z79899 Other long term (current) drug therapy: Secondary | ICD-10-CM | POA: Diagnosis not present

## 2015-09-18 DIAGNOSIS — I951 Orthostatic hypotension: Secondary | ICD-10-CM | POA: Diagnosis not present

## 2015-09-18 DIAGNOSIS — Z9181 History of falling: Secondary | ICD-10-CM | POA: Diagnosis not present

## 2015-09-18 DIAGNOSIS — Z0000 Encounter for general adult medical examination without abnormal findings: Secondary | ICD-10-CM | POA: Diagnosis not present

## 2015-09-18 DIAGNOSIS — Z1389 Encounter for screening for other disorder: Secondary | ICD-10-CM | POA: Diagnosis not present

## 2015-09-23 DIAGNOSIS — E538 Deficiency of other specified B group vitamins: Secondary | ICD-10-CM | POA: Diagnosis not present

## 2015-10-01 DIAGNOSIS — L570 Actinic keratosis: Secondary | ICD-10-CM | POA: Diagnosis not present

## 2015-10-01 DIAGNOSIS — C44712 Basal cell carcinoma of skin of right lower limb, including hip: Secondary | ICD-10-CM | POA: Diagnosis not present

## 2015-10-01 DIAGNOSIS — L821 Other seborrheic keratosis: Secondary | ICD-10-CM | POA: Diagnosis not present

## 2015-11-06 DIAGNOSIS — D510 Vitamin B12 deficiency anemia due to intrinsic factor deficiency: Secondary | ICD-10-CM | POA: Diagnosis not present

## 2015-11-17 DIAGNOSIS — I499 Cardiac arrhythmia, unspecified: Secondary | ICD-10-CM | POA: Diagnosis not present

## 2015-11-17 DIAGNOSIS — Z79899 Other long term (current) drug therapy: Secondary | ICD-10-CM | POA: Diagnosis not present

## 2015-11-17 DIAGNOSIS — R030 Elevated blood-pressure reading, without diagnosis of hypertension: Secondary | ICD-10-CM | POA: Diagnosis not present

## 2015-11-17 DIAGNOSIS — R42 Dizziness and giddiness: Secondary | ICD-10-CM | POA: Diagnosis not present

## 2015-12-09 DIAGNOSIS — D510 Vitamin B12 deficiency anemia due to intrinsic factor deficiency: Secondary | ICD-10-CM | POA: Diagnosis not present

## 2016-01-13 DIAGNOSIS — D510 Vitamin B12 deficiency anemia due to intrinsic factor deficiency: Secondary | ICD-10-CM | POA: Diagnosis not present

## 2016-02-06 DIAGNOSIS — Z23 Encounter for immunization: Secondary | ICD-10-CM | POA: Diagnosis not present

## 2016-03-01 DIAGNOSIS — D510 Vitamin B12 deficiency anemia due to intrinsic factor deficiency: Secondary | ICD-10-CM | POA: Diagnosis not present

## 2016-04-07 DIAGNOSIS — L821 Other seborrheic keratosis: Secondary | ICD-10-CM | POA: Diagnosis not present

## 2016-04-07 DIAGNOSIS — L814 Other melanin hyperpigmentation: Secondary | ICD-10-CM | POA: Diagnosis not present

## 2016-04-09 DIAGNOSIS — D510 Vitamin B12 deficiency anemia due to intrinsic factor deficiency: Secondary | ICD-10-CM | POA: Diagnosis not present

## 2016-05-07 DIAGNOSIS — J189 Pneumonia, unspecified organism: Secondary | ICD-10-CM | POA: Diagnosis not present

## 2016-05-07 DIAGNOSIS — I517 Cardiomegaly: Secondary | ICD-10-CM | POA: Diagnosis not present

## 2016-05-31 DIAGNOSIS — D510 Vitamin B12 deficiency anemia due to intrinsic factor deficiency: Secondary | ICD-10-CM | POA: Diagnosis not present

## 2016-06-08 DIAGNOSIS — I517 Cardiomegaly: Secondary | ICD-10-CM

## 2016-06-08 HISTORY — DX: Cardiomegaly: I51.7

## 2016-06-09 DIAGNOSIS — Z6828 Body mass index (BMI) 28.0-28.9, adult: Secondary | ICD-10-CM | POA: Diagnosis not present

## 2016-06-09 DIAGNOSIS — I517 Cardiomegaly: Secondary | ICD-10-CM | POA: Diagnosis not present

## 2016-06-10 DIAGNOSIS — I517 Cardiomegaly: Secondary | ICD-10-CM | POA: Diagnosis not present

## 2016-07-08 DIAGNOSIS — D510 Vitamin B12 deficiency anemia due to intrinsic factor deficiency: Secondary | ICD-10-CM | POA: Diagnosis not present

## 2016-07-16 DIAGNOSIS — H2513 Age-related nuclear cataract, bilateral: Secondary | ICD-10-CM | POA: Diagnosis not present

## 2016-08-03 DIAGNOSIS — L570 Actinic keratosis: Secondary | ICD-10-CM | POA: Diagnosis not present

## 2016-08-10 DIAGNOSIS — D510 Vitamin B12 deficiency anemia due to intrinsic factor deficiency: Secondary | ICD-10-CM | POA: Diagnosis not present

## 2016-09-13 DIAGNOSIS — D510 Vitamin B12 deficiency anemia due to intrinsic factor deficiency: Secondary | ICD-10-CM | POA: Diagnosis not present

## 2016-09-23 DIAGNOSIS — D510 Vitamin B12 deficiency anemia due to intrinsic factor deficiency: Secondary | ICD-10-CM | POA: Diagnosis not present

## 2016-09-23 DIAGNOSIS — Z79899 Other long term (current) drug therapy: Secondary | ICD-10-CM | POA: Diagnosis not present

## 2016-09-23 DIAGNOSIS — Z0000 Encounter for general adult medical examination without abnormal findings: Secondary | ICD-10-CM | POA: Diagnosis not present

## 2016-09-23 DIAGNOSIS — E785 Hyperlipidemia, unspecified: Secondary | ICD-10-CM | POA: Diagnosis not present

## 2016-09-23 DIAGNOSIS — Z1389 Encounter for screening for other disorder: Secondary | ICD-10-CM | POA: Diagnosis not present

## 2016-09-23 DIAGNOSIS — I7090 Unspecified atherosclerosis: Secondary | ICD-10-CM | POA: Diagnosis not present

## 2016-09-23 DIAGNOSIS — Z125 Encounter for screening for malignant neoplasm of prostate: Secondary | ICD-10-CM | POA: Diagnosis not present

## 2016-09-23 DIAGNOSIS — Z9181 History of falling: Secondary | ICD-10-CM | POA: Diagnosis not present

## 2016-10-21 DIAGNOSIS — C44729 Squamous cell carcinoma of skin of left lower limb, including hip: Secondary | ICD-10-CM | POA: Diagnosis not present

## 2016-10-26 DIAGNOSIS — D0472 Carcinoma in situ of skin of left lower limb, including hip: Secondary | ICD-10-CM | POA: Diagnosis not present

## 2016-11-15 DIAGNOSIS — D510 Vitamin B12 deficiency anemia due to intrinsic factor deficiency: Secondary | ICD-10-CM | POA: Diagnosis not present

## 2016-12-08 DIAGNOSIS — D0472 Carcinoma in situ of skin of left lower limb, including hip: Secondary | ICD-10-CM | POA: Diagnosis not present

## 2016-12-29 DIAGNOSIS — D510 Vitamin B12 deficiency anemia due to intrinsic factor deficiency: Secondary | ICD-10-CM | POA: Diagnosis not present

## 2017-01-05 DIAGNOSIS — C44729 Squamous cell carcinoma of skin of left lower limb, including hip: Secondary | ICD-10-CM | POA: Diagnosis not present

## 2017-02-03 DIAGNOSIS — Z23 Encounter for immunization: Secondary | ICD-10-CM | POA: Diagnosis not present

## 2017-03-01 DIAGNOSIS — D510 Vitamin B12 deficiency anemia due to intrinsic factor deficiency: Secondary | ICD-10-CM | POA: Diagnosis not present

## 2017-03-18 DIAGNOSIS — M79645 Pain in left finger(s): Secondary | ICD-10-CM | POA: Diagnosis not present

## 2017-03-18 DIAGNOSIS — Z6829 Body mass index (BMI) 29.0-29.9, adult: Secondary | ICD-10-CM | POA: Diagnosis not present

## 2017-04-12 DIAGNOSIS — L821 Other seborrheic keratosis: Secondary | ICD-10-CM | POA: Diagnosis not present

## 2017-04-12 DIAGNOSIS — L570 Actinic keratosis: Secondary | ICD-10-CM | POA: Diagnosis not present

## 2017-04-12 DIAGNOSIS — C44329 Squamous cell carcinoma of skin of other parts of face: Secondary | ICD-10-CM | POA: Diagnosis not present

## 2017-04-12 DIAGNOSIS — Z85820 Personal history of malignant melanoma of skin: Secondary | ICD-10-CM | POA: Diagnosis not present

## 2017-04-28 DIAGNOSIS — D510 Vitamin B12 deficiency anemia due to intrinsic factor deficiency: Secondary | ICD-10-CM | POA: Diagnosis not present

## 2017-06-02 DIAGNOSIS — D510 Vitamin B12 deficiency anemia due to intrinsic factor deficiency: Secondary | ICD-10-CM | POA: Diagnosis not present

## 2017-06-21 DIAGNOSIS — C44229 Squamous cell carcinoma of skin of left ear and external auricular canal: Secondary | ICD-10-CM | POA: Diagnosis not present

## 2017-06-21 DIAGNOSIS — L570 Actinic keratosis: Secondary | ICD-10-CM | POA: Diagnosis not present

## 2017-06-21 DIAGNOSIS — L821 Other seborrheic keratosis: Secondary | ICD-10-CM | POA: Diagnosis not present

## 2017-07-29 DIAGNOSIS — H2513 Age-related nuclear cataract, bilateral: Secondary | ICD-10-CM | POA: Diagnosis not present

## 2017-08-22 DIAGNOSIS — Z6829 Body mass index (BMI) 29.0-29.9, adult: Secondary | ICD-10-CM | POA: Diagnosis not present

## 2017-08-22 DIAGNOSIS — K644 Residual hemorrhoidal skin tags: Secondary | ICD-10-CM | POA: Diagnosis not present

## 2017-08-24 DIAGNOSIS — D510 Vitamin B12 deficiency anemia due to intrinsic factor deficiency: Secondary | ICD-10-CM | POA: Diagnosis not present

## 2017-09-06 DIAGNOSIS — Z9181 History of falling: Secondary | ICD-10-CM | POA: Diagnosis not present

## 2017-09-06 DIAGNOSIS — K649 Unspecified hemorrhoids: Secondary | ICD-10-CM | POA: Diagnosis not present

## 2017-09-06 DIAGNOSIS — Z1331 Encounter for screening for depression: Secondary | ICD-10-CM | POA: Diagnosis not present

## 2017-09-06 DIAGNOSIS — Z1339 Encounter for screening examination for other mental health and behavioral disorders: Secondary | ICD-10-CM | POA: Diagnosis not present

## 2017-09-27 DIAGNOSIS — Z0000 Encounter for general adult medical examination without abnormal findings: Secondary | ICD-10-CM | POA: Diagnosis not present

## 2017-09-27 DIAGNOSIS — I7090 Unspecified atherosclerosis: Secondary | ICD-10-CM | POA: Diagnosis not present

## 2017-09-27 DIAGNOSIS — E559 Vitamin D deficiency, unspecified: Secondary | ICD-10-CM | POA: Diagnosis not present

## 2017-09-27 DIAGNOSIS — Z6829 Body mass index (BMI) 29.0-29.9, adult: Secondary | ICD-10-CM | POA: Diagnosis not present

## 2017-09-27 DIAGNOSIS — D510 Vitamin B12 deficiency anemia due to intrinsic factor deficiency: Secondary | ICD-10-CM | POA: Diagnosis not present

## 2017-09-27 DIAGNOSIS — Z79899 Other long term (current) drug therapy: Secondary | ICD-10-CM | POA: Diagnosis not present

## 2017-09-27 DIAGNOSIS — E785 Hyperlipidemia, unspecified: Secondary | ICD-10-CM | POA: Diagnosis not present

## 2017-09-27 DIAGNOSIS — I499 Cardiac arrhythmia, unspecified: Secondary | ICD-10-CM | POA: Diagnosis not present

## 2017-09-27 DIAGNOSIS — M1990 Unspecified osteoarthritis, unspecified site: Secondary | ICD-10-CM | POA: Diagnosis not present

## 2017-09-29 DIAGNOSIS — D510 Vitamin B12 deficiency anemia due to intrinsic factor deficiency: Secondary | ICD-10-CM | POA: Diagnosis not present

## 2017-10-20 DIAGNOSIS — L570 Actinic keratosis: Secondary | ICD-10-CM | POA: Diagnosis not present

## 2017-10-20 DIAGNOSIS — C44619 Basal cell carcinoma of skin of left upper limb, including shoulder: Secondary | ICD-10-CM | POA: Diagnosis not present

## 2017-10-20 DIAGNOSIS — C44629 Squamous cell carcinoma of skin of left upper limb, including shoulder: Secondary | ICD-10-CM | POA: Diagnosis not present

## 2017-11-07 DIAGNOSIS — D510 Vitamin B12 deficiency anemia due to intrinsic factor deficiency: Secondary | ICD-10-CM | POA: Diagnosis not present

## 2017-12-09 DIAGNOSIS — D510 Vitamin B12 deficiency anemia due to intrinsic factor deficiency: Secondary | ICD-10-CM | POA: Diagnosis not present

## 2018-02-13 DIAGNOSIS — D510 Vitamin B12 deficiency anemia due to intrinsic factor deficiency: Secondary | ICD-10-CM | POA: Diagnosis not present

## 2018-02-20 DIAGNOSIS — Z23 Encounter for immunization: Secondary | ICD-10-CM | POA: Diagnosis not present

## 2018-02-28 DIAGNOSIS — L821 Other seborrheic keratosis: Secondary | ICD-10-CM | POA: Diagnosis not present

## 2018-02-28 DIAGNOSIS — L570 Actinic keratosis: Secondary | ICD-10-CM | POA: Diagnosis not present

## 2018-02-28 DIAGNOSIS — C44722 Squamous cell carcinoma of skin of right lower limb, including hip: Secondary | ICD-10-CM | POA: Diagnosis not present

## 2018-03-23 DIAGNOSIS — D510 Vitamin B12 deficiency anemia due to intrinsic factor deficiency: Secondary | ICD-10-CM | POA: Diagnosis not present

## 2018-04-24 DIAGNOSIS — D510 Vitamin B12 deficiency anemia due to intrinsic factor deficiency: Secondary | ICD-10-CM | POA: Diagnosis not present

## 2018-05-24 DIAGNOSIS — D510 Vitamin B12 deficiency anemia due to intrinsic factor deficiency: Secondary | ICD-10-CM | POA: Diagnosis not present

## 2018-06-13 DIAGNOSIS — L309 Dermatitis, unspecified: Secondary | ICD-10-CM | POA: Diagnosis not present

## 2018-06-19 DIAGNOSIS — H9113 Presbycusis, bilateral: Secondary | ICD-10-CM | POA: Insufficient documentation

## 2018-06-19 DIAGNOSIS — Z9089 Acquired absence of other organs: Secondary | ICD-10-CM | POA: Diagnosis not present

## 2018-06-19 DIAGNOSIS — H903 Sensorineural hearing loss, bilateral: Secondary | ICD-10-CM | POA: Diagnosis not present

## 2018-06-19 DIAGNOSIS — Z7289 Other problems related to lifestyle: Secondary | ICD-10-CM | POA: Diagnosis not present

## 2018-07-21 DIAGNOSIS — J302 Other seasonal allergic rhinitis: Secondary | ICD-10-CM | POA: Diagnosis not present

## 2018-07-21 DIAGNOSIS — Z6829 Body mass index (BMI) 29.0-29.9, adult: Secondary | ICD-10-CM | POA: Diagnosis not present

## 2018-08-29 DIAGNOSIS — E538 Deficiency of other specified B group vitamins: Secondary | ICD-10-CM | POA: Diagnosis not present

## 2018-09-04 DIAGNOSIS — E7800 Pure hypercholesterolemia, unspecified: Secondary | ICD-10-CM | POA: Diagnosis not present

## 2018-09-04 DIAGNOSIS — K219 Gastro-esophageal reflux disease without esophagitis: Secondary | ICD-10-CM | POA: Diagnosis not present

## 2018-09-04 DIAGNOSIS — Z6829 Body mass index (BMI) 29.0-29.9, adult: Secondary | ICD-10-CM | POA: Diagnosis not present

## 2018-10-04 DIAGNOSIS — R0789 Other chest pain: Secondary | ICD-10-CM | POA: Diagnosis not present

## 2018-10-04 DIAGNOSIS — M1990 Unspecified osteoarthritis, unspecified site: Secondary | ICD-10-CM | POA: Diagnosis not present

## 2018-10-04 DIAGNOSIS — Z0000 Encounter for general adult medical examination without abnormal findings: Secondary | ICD-10-CM | POA: Diagnosis not present

## 2018-10-04 DIAGNOSIS — Z79899 Other long term (current) drug therapy: Secondary | ICD-10-CM | POA: Diagnosis not present

## 2018-10-04 DIAGNOSIS — E559 Vitamin D deficiency, unspecified: Secondary | ICD-10-CM | POA: Diagnosis not present

## 2018-10-04 DIAGNOSIS — Z9181 History of falling: Secondary | ICD-10-CM | POA: Diagnosis not present

## 2018-10-04 DIAGNOSIS — E785 Hyperlipidemia, unspecified: Secondary | ICD-10-CM | POA: Diagnosis not present

## 2018-10-04 DIAGNOSIS — Z1331 Encounter for screening for depression: Secondary | ICD-10-CM | POA: Diagnosis not present

## 2018-10-05 DIAGNOSIS — M810 Age-related osteoporosis without current pathological fracture: Secondary | ICD-10-CM | POA: Diagnosis not present

## 2018-10-10 ENCOUNTER — Ambulatory Visit (INDEPENDENT_AMBULATORY_CARE_PROVIDER_SITE_OTHER): Payer: Medicare Other | Admitting: Cardiology

## 2018-10-10 ENCOUNTER — Other Ambulatory Visit: Payer: Self-pay

## 2018-10-10 ENCOUNTER — Encounter: Payer: Self-pay | Admitting: Cardiology

## 2018-10-10 VITALS — BP 134/68 | HR 68 | Ht 69.0 in | Wt 193.0 lb

## 2018-10-10 DIAGNOSIS — R079 Chest pain, unspecified: Secondary | ICD-10-CM

## 2018-10-10 DIAGNOSIS — E7800 Pure hypercholesterolemia, unspecified: Secondary | ICD-10-CM

## 2018-10-10 DIAGNOSIS — K219 Gastro-esophageal reflux disease without esophagitis: Secondary | ICD-10-CM

## 2018-10-10 DIAGNOSIS — R0789 Other chest pain: Secondary | ICD-10-CM

## 2018-10-10 DIAGNOSIS — G479 Sleep disorder, unspecified: Secondary | ICD-10-CM

## 2018-10-10 DIAGNOSIS — I517 Cardiomegaly: Secondary | ICD-10-CM

## 2018-10-10 HISTORY — DX: Other chest pain: R07.89

## 2018-10-10 HISTORY — DX: Pure hypercholesterolemia, unspecified: E78.00

## 2018-10-10 HISTORY — DX: Gastro-esophageal reflux disease without esophagitis: K21.9

## 2018-10-10 HISTORY — DX: Sleep disorder, unspecified: G47.9

## 2018-10-10 NOTE — Patient Instructions (Signed)
Medication Instructions:  Your physician recommends that you continue on your current medications as directed. Please refer to the Current Medication list given to you today.  If you need a refill on your cardiac medications before your next appointment, please call your pharmacy.   Lab work: None.  If you have labs (blood work) drawn today and your tests are completely normal, you will receive your results only by:  [place] (if you have MyChart) OR  A paper copy in the mail If you have any lab test that is abnormal or we need to change your treatment, we will call you to review the results.  Testing/Procedures: Your physician has requested that you have an echocardiogram. Echocardiography is a painless test that uses sound waves to create images of your heart. It provides your doctor with information about the size and shape of your heart and how well your hearts chambers and valves are working. This procedure takes approximately one hour. There are no restrictions for this procedure.  Your physician has requested that you have a lexiscan myoview. For further information please visit [email or web address]. Please follow instruction sheet, as given.    [facility] [address] [place], [place] [address] Phone:  [phone]  October 10, 2018    Ryan Ball DOB: [birth year] MRN: [id number] [address] [place] [address]   Dear Mr. Efferson,  Dennis Bast will be scheduled for a cardiac nuclear scan.  Please arrive 15 minutes prior to your appointment time for registration and insurance purposes.  The test will take approximately 3 to 4 hours to complete; you may bring reading material.  If someone comes with you to your appointment, they will need to remain in the main lobby due to limited space in the testing area. **If you are pregnant or breastfeeding, please notify the nuclear lab prior to your appointment**  How to prepare for your Myocardial  Perfusion Test:  Do not eat or drink 3 hours prior to your test, except you may have water.  Do not consume products containing caffeine (regular or decaffeinated) 12 hours prior to your test. (ex: coffee, chocolate, sodas, tea).  Do bring a list of your current medications with you.  If not listed below, you may take your medications as normal.  Do wear comfortable clothes (no dresses or overalls) and walking shoes, tennis shoes preferred (No heels or open toe shoes are allowed).  Do NOT wear cologne, perfume, aftershave, or lotions (deodorant is allowed).  If these instructions are not followed, your test will have to be rescheduled.  Please report to [address] for your test.  If you have questions or concerns about your appointment, you can call the [facility] at [phone].  If you cannot keep your appointment, please provide 24 hours notification to the Nuclear Lab, to avoid a possible $50 charge to your account.  Follow-Up: At [facility], you and your health needs are our priority.  As part of our continuing mission to provide you with exceptional heart care, we have created designated Provider Care Teams.  These Care Teams include your primary Cardiologist (physician) and Advanced Practice Providers (APPs -  Physician Assistants and Nurse Practitioners) who all work together to provide you with the care you need, when you need it. You will need a follow up appointment in 1 months.  Please call our office 2 months in advance to schedule this appointment.  You may see No primary care  provider on file. or another member of our [employer] Provider Team in [place]: Shirlee More, MD  Jyl Heinz, MD  Any Other Special Instructions Will Be Listed Below (If Applicable).   Echocardiogram An echocardiogram is a procedure that uses painless sound waves (ultrasound) to produce an image of the heart. Images from an echocardiogram can  provide important information about:  Signs of coronary artery disease (CAD).  Aneurysm detection. An aneurysm is a weak or damaged part of an artery wall that bulges out from the normal force of blood pumping through the body.  Heart size and shape. Changes in the size or shape of the heart can be associated with certain conditions, including heart failure, aneurysm, and CAD.  Heart muscle function.  Heart valve function.  Signs of a past heart attack.  Fluid buildup around the heart.  Thickening of the heart muscle.  A tumor or infectious growth around the heart valves. Tell a health care provider about:  Any allergies you have.  All medicines you are taking, including vitamins, herbs, eye drops, creams, and over-the-counter medicines.  Any blood disorders you have.  Any surgeries you have had.  Any medical conditions you have.  Whether you are pregnant or may be pregnant. What are the risks? Generally, this is a safe procedure. However, problems may occur, including:  Allergic reaction to dye (contrast) that may be used during the procedure. What happens before the procedure? No specific preparation is needed. You may eat and drink normally. What happens during the procedure?   An IV tube may be inserted into one of your veins.  You may receive contrast through this tube. A contrast is an injection that improves the quality of the pictures from your heart.  A gel will be applied to your chest.  A wand-like tool (transducer) will be moved over your chest. The gel will help to transmit the sound waves from the transducer.  The sound waves will harmlessly bounce off of your heart to allow the heart images to be captured in real-time motion. The images will be recorded on a computer. The procedure may vary among health care providers and hospitals. What happens after the procedure?  You may return to your normal, everyday life, including diet, activities, and  medicines, unless your health care provider tells you not to do that. Summary  An echocardiogram is a procedure that uses painless sound waves (ultrasound) to produce an image of the heart.  Images from an echocardiogram can provide important information about the size and shape of your heart, heart muscle function, heart valve function, and fluid buildup around your heart.  You do not need to do anything to prepare before this procedure. You may eat and drink normally.  After the echocardiogram is completed, you may return to your normal, everyday life, unless your health care provider tells you not to do that. This information is not intended to replace advice given to you by your health care provider. Make sure you discuss any questions you have with your health care provider. Document Released: 04/23/2000 Document Revised: 05/29/2016 Document Reviewed: 05/29/2016 Elsevier Interactive Patient Education  2019 [place].   Cardiac Nuclear Scan A cardiac nuclear scan is a test that measures blood flow to the heart when a person is resting and when he or she is exercising. The test looks for problems such as:  Not enough blood reaching a portion of the heart.  The heart muscle not working normally. You may need this  test if:  You have heart disease.  You have had abnormal lab results.  You have had heart surgery or a balloon procedure to open up blocked arteries (angioplasty).  You have chest pain.  You have shortness of breath. In this test, a radioactive dye (tracer) is injected into your bloodstream. After the tracer has traveled to your heart, an imaging device is used to measure how much of the tracer is absorbed by or distributed to various areas of your heart. This procedure is usually done at a hospital and takes 2-4 hours. Tell a health care provider about:  Any allergies you have.  All medicines you are taking, including vitamins, herbs, eye drops, creams, and  over-the-counter medicines.  Any problems you or family members have had with anesthetic medicines.  Any blood disorders you have.  Any surgeries you have had.  Any medical conditions you have.  Whether you are pregnant or may be pregnant. What are the risks? Generally, this is a safe procedure. However, problems may occur, including:  Serious chest pain and heart attack. This is only a risk if the stress portion of the test is done.  Rapid heartbeat.  Sensation of warmth in your chest. This usually passes quickly.  Allergic reaction to the tracer. What happens before the procedure?  Ask your health care provider about changing or stopping your regular medicines. This is especially important if you are taking diabetes medicines or blood thinners.  Follow instructions from your health care provider about eating or drinking restrictions.  Remove your jewelry on the day of the procedure. What happens during the procedure?  An IV will be inserted into one of your veins.  Your health care provider will inject a small amount of radioactive tracer through the IV.  You will wait for 20-40 minutes while the tracer travels through your bloodstream.  Your heart activity will be monitored with an electrocardiogram (ECG).  You will lie down on an exam table.  Images of your heart will be taken for about 15-20 minutes.  You may also have a stress test. For this test, one of the following may be done: ? You will exercise on a treadmill or stationary bike. While you exercise, your heart's activity will be monitored with an ECG, and your blood pressure will be checked. ? You will be given medicines that will increase blood flow to parts of your heart. This is done if you are unable to exercise.  When blood flow to your heart has peaked, a tracer will again be injected through the IV.  After 20-40 minutes, you will get back on the exam table and have more images taken of your  heart.  Depending on the type of tracer used, scans may need to be repeated 3-4 hours later.  Your IV line will be removed when the procedure is over. The procedure may vary among health care providers and hospitals. What happens after the procedure?  Unless your health care provider tells you otherwise, you may return to your normal schedule, including diet, activities, and medicines.  Unless your health care provider tells you otherwise, you may increase your fluid intake. This will help to flush the contrast dye from your body. Drink enough fluid to keep your urine pale yellow.  Ask your health care provider, or the department that is doing the test: ? When will my results be ready? ? How will I get my results? Summary  A cardiac nuclear scan measures the blood flow to  the heart when a person is resting and when he or she is exercising.  Tell your health care provider if you are pregnant.  Before the procedure, ask your health care provider about changing or stopping your regular medicines. This is especially important if you are taking diabetes medicines or blood thinners.  After the procedure, unless your health care provider tells you otherwise, increase your fluid intake. This will help flush the contrast dye from your body.  After the procedure, unless your health care provider tells you otherwise, you may return to your normal schedule, including diet, activities, and medicines. This information is not intended to replace advice given to you by your health care provider. Make sure you discuss any questions you have with your health care provider. Document Released: 05/21/2004 Document Revised: 10/10/2017 Document Reviewed: 10/10/2017 Elsevier Interactive Patient Education  2019 [employer].

## 2018-10-10 NOTE — Progress Notes (Signed)
Cardiology Consultation:    Date:  10/10/2018   ID:  Ryan Ball, DOB [birth year], MRN [id number]  PCP:  Angelina Sheriff, MD  Cardiologist:  Jenne Campus, MD   Referring MD: Angelina Sheriff, MD   Chief Complaint  Patient presents with  . Chest Pain    with exertion off and on  Of chest pain  History of Present Illness:    Ryan Ball is a 85 y.o. male who is being seen today for the evaluation of atypical chest pain at the request of Angelina Sheriff, MD.  He is a poor historian he is in the office with his daughter he complains of having some chest pain.  Apparently he is fairly active and try to exercise on a regular basis he walks however according to his daughter very slowly she also got some weights and try to lose some weight.  He said for last few initially 6 months and then even years he experienced some chest sensation after he lifted some weights.  He used some dumbbells and its typically about 5 to 7.5 pounds.  When he do it then he will feel an easy sensation in the chest lasting sometimes up to 3 days.  There is no aggravating there is no relieving factors.  It simply there there is no shortness of breath there is no sweating associated with this sensation.  It bothers him enough that he decided to visit dialysis to talk about it.  He did have some remote testing on the heart done in 2005 all were negative there was also some office for stress testing which was refused before.  He does have hypertension dyslipidemia he never smoked but he lives with his wife smokes a lot.  No past medical history on file.    Current Medications: Current Meds  Medication Sig  . Cholecalciferol (REPLESTA) 1.25 MG (50000 UT) WAFR Take 1 Wafer by mouth every 14 (fourteen) days.  Marland Kitchen doxepin (SINEQUAN) 25 MG capsule Take 1 capsule by mouth at bedtime as needed.  . rosuvastatin (CRESTOR) 5 MG tablet Take 1 tablet by mouth daily.     Allergies:   Patient has no known  allergies.   Social History   Socioeconomic History  . Marital status: Married    Spouse name: Not on file  . Number of children: Not on file  . Years of education: Not on file  . Highest education level: Not on file  Occupational History  . Not on file  Social Needs  . Financial resource strain: Not on file  . Food insecurity:    Worry: Not on file    Inability: Not on file  . Transportation needs:    Medical: Not on file    Non-medical: Not on file  Tobacco Use  . Smoking status: Never Smoker  . Smokeless tobacco: Never Used  Substance and Sexual Activity  . Alcohol use: Yes    Alcohol/week: 6.0 standard drinks    Types: 6 Glasses of wine per week    Comment: 1-2 glasses of wine 2-3 times a week  . Drug use: Never  . Sexual activity: Not on file  Lifestyle  . Physical activity:    Days per week: Not on file    Minutes per session: Not on file  . Stress: Not on file  Relationships  . Social connections:    Talks on phone: Not on file    Gets together: Not on file  Attends religious service: Not on file    Active member of club or organization: Not on file    Attends meetings of clubs or organizations: Not on file    Relationship status: Not on file  Other Topics Concern  . Not on file  Social History Narrative  . Not on file     Family History: The patient's family history includes CAD in his brother and father. ROS:   Please see the history of present illness.    All 14 point review of systems negative except as described per history of present illness.  EKGs/Labs/Other Studies Reviewed:    The following studies were reviewed today:   EKG:  EKG is  ordered today.  The ekg ordered today demonstrates sinus rhythm with premature ventricular and supraventricular complexes.  Left axis deviation.  Poor R wave progression anterior precordium.  Nonspecific ST-T segment changes  Recent Labs: No results found for requested labs within last 8760 hours.  Recent  Lipid Panel No results found for: CHOL, TRIG, HDL, CHOLHDL, VLDL, LDLCALC, LDLDIRECT  Physical Exam:    VS:  BP 134/68   Pulse 68   Ht 5\' 9"  (1.753 m)   Wt 193 lb (87.5 kg)   SpO2 98%   BMI 28.50 kg/m     Wt Readings from Last 3 Encounters:  10/10/18 193 lb (87.5 kg)     GEN:  Well nourished, well developed in no acute distress HEENT: Normal NECK: No JVD; No carotid bruits LYMPHATICS: No lymphadenopathy CARDIAC: RRR, systolic ejection murmur grade 1/6 best heard at the right upper portion of the sternum.  There is also holosystolic murmur grade 1/6 to 2/6 best heard at the apex, no rubs, no gallops RESPIRATORY:  Clear to auscultation without rales, wheezing or rhonchi  ABDOMEN: Soft, non-tender, non-distended MUSCULOSKELETAL:  No edema; No deformity  SKIN: Warm and dry NEUROLOGIC:  Alert and oriented x 3 PSYCHIATRIC:  Normal affect   ASSESSMENT:    1. Atypical chest pain   2. Hypercholesteremia   3. Gastroesophageal reflux disease, esophagitis presence not specified   4. Cardiomegaly    PLAN:    In order of problems listed above:  1. Atypical chest pain we had long discussion about what to do with the situation.  I asked him to start taking one baby aspirin every single day.  I will schedule him to have [place].  I do not think his pain is significant enough to initiate any medications on top of that he is very reluctant to take any medications.  He even had difficulty taking aspirin. 2. Dyslipidemia he is taking Crestor which I will continue I will call primary care physician to get his fasting lipid profile. 3. Gastroesophageal reflux disease with apparently large hiatal hernia which could be responsible for his symptoms the way I can explain its potentially when he lift some weights he will have some pressure buildup in his belly which pushed hiatal hernia up and that make him feel that way.  But this is just speculative explanation.  We need to make sure he does not  have any significant coronary artery disease 4. Cardiomegaly on x-ray described previously.  Echocardiogram will be done to assess left ventricular ejection fraction as well as murmurs in size of the heart.   Medication Adjustments/Labs and Tests Ordered: Current medicines are reviewed at length with the patient today.  Concerns regarding medicines are outlined above.  No orders of the defined types were placed in this encounter.  No orders of the defined types were placed in this encounter.   Signed, Park Liter, MD, [facility]. 10/10/2018 5:04 PM    [place]

## 2018-10-11 ENCOUNTER — Telehealth: Payer: Self-pay | Admitting: Emergency Medicine

## 2018-10-11 NOTE — Telephone Encounter (Signed)
Left message for patient to return call to inform him of the appointment times for upcoming testing and follow up appointment.

## 2018-10-16 DIAGNOSIS — E538 Deficiency of other specified B group vitamins: Secondary | ICD-10-CM | POA: Diagnosis not present

## 2018-10-20 DIAGNOSIS — H2513 Age-related nuclear cataract, bilateral: Secondary | ICD-10-CM | POA: Diagnosis not present

## 2018-10-30 ENCOUNTER — Ambulatory Visit (INDEPENDENT_AMBULATORY_CARE_PROVIDER_SITE_OTHER): Payer: Medicare Other

## 2018-10-30 ENCOUNTER — Other Ambulatory Visit: Payer: Self-pay

## 2018-10-30 DIAGNOSIS — R0789 Other chest pain: Secondary | ICD-10-CM

## 2018-10-30 NOTE — Progress Notes (Signed)
2D Echocardiogram performed  10/30/18 Cardell Peach

## 2018-11-03 ENCOUNTER — Telehealth (HOSPITAL_COMMUNITY): Payer: Self-pay | Admitting: *Deleted

## 2018-11-03 NOTE — Telephone Encounter (Signed)
Left message on voicemail in reference to upcoming appointment scheduled for 11/07/18. Phone number given for a call back so details instructions can be given. Roschelle Calandra Jacqueline   

## 2018-11-07 ENCOUNTER — Other Ambulatory Visit: Payer: Self-pay

## 2018-11-07 ENCOUNTER — Ambulatory Visit (INDEPENDENT_AMBULATORY_CARE_PROVIDER_SITE_OTHER): Payer: Medicare Other

## 2018-11-07 DIAGNOSIS — R0789 Other chest pain: Secondary | ICD-10-CM | POA: Diagnosis not present

## 2018-11-07 LAB — MYOCARDIAL PERFUSION IMAGING
LV dias vol: 103 mL (ref 62–150)
LV sys vol: 51 mL
Peak HR: 92 {beats}/min
Rest HR: 84 {beats}/min
SDS: 2
SRS: 0
SSS: 2
TID: 1.14

## 2018-11-07 MED ORDER — TECHNETIUM TC 99M TETROFOSMIN IV KIT
10.1000 | PACK | Freq: Once | INTRAVENOUS | Status: AC | PRN
  Administered 2018-11-07: 10.1 via INTRAVENOUS

## 2018-11-07 MED ORDER — REGADENOSON 0.4 MG/5ML IV SOLN
0.4000 mg | Freq: Once | INTRAVENOUS | Status: AC
  Administered 2018-11-07: 0.4 mg via INTRAVENOUS

## 2018-11-07 MED ORDER — TECHNETIUM TC 99M TETROFOSMIN IV KIT
32.5000 | PACK | Freq: Once | INTRAVENOUS | Status: AC | PRN
  Administered 2018-11-07: 32.5 via INTRAVENOUS

## 2018-11-09 ENCOUNTER — Ambulatory Visit (INDEPENDENT_AMBULATORY_CARE_PROVIDER_SITE_OTHER): Payer: Medicare Other | Admitting: Cardiology

## 2018-11-09 ENCOUNTER — Other Ambulatory Visit: Payer: Self-pay

## 2018-11-09 ENCOUNTER — Encounter: Payer: Self-pay | Admitting: Cardiology

## 2018-11-09 VITALS — BP 160/80 | HR 78 | Ht 69.0 in | Wt 195.2 lb

## 2018-11-09 DIAGNOSIS — R0789 Other chest pain: Secondary | ICD-10-CM

## 2018-11-09 DIAGNOSIS — K219 Gastro-esophageal reflux disease without esophagitis: Secondary | ICD-10-CM

## 2018-11-09 DIAGNOSIS — I517 Cardiomegaly: Secondary | ICD-10-CM | POA: Diagnosis not present

## 2018-11-09 DIAGNOSIS — E7800 Pure hypercholesterolemia, unspecified: Secondary | ICD-10-CM | POA: Diagnosis not present

## 2018-11-09 NOTE — Progress Notes (Signed)
Cardiology Office Note:    Date:  11/09/2018   ID:  [name], DOB [birth year], MRN [id number]  PCP:  Angelina Sheriff, MD  Cardiologist:  Jenne Campus, MD    Referring MD: Angelina Sheriff, MD   Chief Complaint  Patient presents with  . Follow-up  Doing better  History of Present Illness:    Ryan Ball is a 85 y.o. male who is referred to me because of atypical chest pain.  Also x-ray showed potential cardiomegaly.  Today he comes for follow-up and discuss results of his test overall he is doing well denies having any recent chest pain tightness squeezing pressure burning chest.  We talked a lot about his situation he is grieving after loss of his wife he is also tell me that he is very lonely and he himself point to the fact that his symptoms may be related to grieving and overall anxiety.  He said what bring joy to his life is to talk to his grandchildren in the matter-of-fact within next 2 weeks his plan to go to the beach with his grandchildren and he is looking forward to it.  His oldest grandson however is in the [place] and he is worried about him a lot.  He is able to walk climb stairs with some shortness of breath fatigue but no recent chest pain.  No past medical history on file.    Current Medications: Current Meds  Medication Sig  . Cholecalciferol (REPLESTA) 1.25 MG (50000 UT) WAFR Take 1 Wafer by mouth every 14 (fourteen) days.  Marland Kitchen doxepin (SINEQUAN) 25 MG capsule Take 1 capsule by mouth at bedtime as needed.  . rosuvastatin (CRESTOR) 5 MG tablet Take 1 tablet by mouth every other day.      Allergies:   Patient has no known allergies.   Social History   Socioeconomic History  . Marital status: Married    Spouse name: Not on file  . Number of children: Not on file  . Years of education: Not on file  . Highest education level: Not on file  Occupational History  . Not on file  Social Needs  . Financial resource strain: Not on file  . Food  insecurity    Worry: Not on file    Inability: Not on file  . Transportation needs    Medical: Not on file    Non-medical: Not on file  Tobacco Use  . Smoking status: Never Smoker  . Smokeless tobacco: Never Used  Substance and Sexual Activity  . Alcohol use: Yes    Alcohol/week: 6.0 standard drinks    Types: 6 Glasses of wine per week    Comment: 1-2 glasses of wine 2-3 times a week  . Drug use: Never  . Sexual activity: Not on file  Lifestyle  . Physical activity    Days per week: Not on file    Minutes per session: Not on file  . Stress: Not on file  Relationships  . Social Herbalist on phone: Not on file    Gets together: Not on file    Attends religious service: Not on file    Active member of club or organization: Not on file    Attends meetings of clubs or organizations: Not on file    Relationship status: Not on file  Other Topics Concern  . Not on file  Social History Narrative  . Not on file     Family History: The  patient's family history includes CAD in his brother and father. ROS:   Please see the history of present illness.    All 14 point review of systems negative except as described per history of present illness  EKGs/Labs/Other Studies Reviewed:     Stress test done on 11/07/2018 showed:  Nuclear stress EF: 50%.  The left ventricular ejection fraction is mildly decreased (45-54%).  Blood pressure demonstrated a normal response to exercise.  There was no ST segment deviation noted during stress.  Defect 1: There is a small defect present in the apical lateral location.  This is a low risk study.  No ischemia, No MI  Echocardiogram done on 10/30/2018: 1. The left ventricle has normal systolic function with an ejection fraction of 60-65%. The cavity size was normal. Left ventricular diastolic Doppler parameters are consistent with impaired relaxation.  2. The right ventricle has normal systolic function. The cavity was normal.  There is no increase in right ventricular wall thickness.  3. The aortic valve is grossly normal. Aortic valve regurgitation was not assessed by color flow Doppler.  EKG done today shows sinus tachycardia with frequent premature supraventricular complexes.  Possible inferior wall MI, poor R wave progression.  Precordium.  Nonspecific ST-T segment changes EKG was done to rule out atrial fibrillation which he does not have   Recent Labs: No results found for requested labs within last 8760 hours.  Recent Lipid Panel No results found for: CHOL, TRIG, HDL, CHOLHDL, VLDL, LDLCALC, LDLDIRECT  Physical Exam:    VS:  BP (!) 160/80   Pulse 78   Ht 5\' 9"  (1.753 m)   Wt 195 lb 3.2 oz (88.5 kg)   SpO2 98%   BMI 28.83 kg/m     Wt Readings from Last 3 Encounters:  11/09/18 195 lb 3.2 oz (88.5 kg)  11/07/18 193 lb (87.5 kg)  10/10/18 193 lb (87.5 kg)     GEN:  Well nourished, well developed in no acute distress HEENT: Normal NECK: No JVD; No carotid bruits LYMPHATICS: No lymphadenopathy CARDIAC: RRR, no murmurs, no rubs, no gallops RESPIRATORY:  Clear to auscultation without rales, wheezing or rhonchi  ABDOMEN: Soft, non-tender, non-distended MUSCULOSKELETAL:  No edema; No deformity  SKIN: Warm and dry LOWER EXTREMITIES: no swelling NEUROLOGIC:  Alert and oriented x 3 PSYCHIATRIC:  Normal affect   ASSESSMENT:    1. Atypical chest pain   2. Hypercholesteremia   3. Cardiomegaly   4. Gastroesophageal reflux disease, esophagitis presence not specified    PLAN:    In order of problems listed above:  1. Atypical chest pain nuclear stress test negative.  I advised to take some Mylanta from time to time to check if that helps he does have some problem with esophagus as well as hiatal hernia.  He does not want to take medication he said he does not like medications 2. Dyslipidemia again the same situation he does not want to take any medications more than he is taking right now 3.  Cardiomegaly echocardiogram showed normal size of his heart.  We will continue monitoring 4. Gastroesophageal reflux disease which may be responsible for his symptoms. 5. Grieving with some depression most likely.  No suicidal ideation or homicidal ideas.  We talked in length about this I encouraged him to go to the beach with his family. 6. Essential hypertension sometimes blood pressure is elevated when he checked this but he does not want to take anymore medications.   Medication Adjustments/Labs and Tests  Ordered: Current medicines are reviewed at length with the patient today.  Concerns regarding medicines are outlined above.  No orders of the defined types were placed in this encounter.  Medication changes: No orders of the defined types were placed in this encounter.   Signed, Park Liter, MD, [facility] 11/09/2018 1:26 PM    [place]

## 2018-11-09 NOTE — Patient Instructions (Addendum)
Medication Instructions:  Your physician recommends that you continue on your current medications as directed. Please refer to the Current Medication list given to you today.  If you need a refill on your cardiac medications before your next appointment, please call your pharmacy.   Lab work:None ordered If you have labs (blood work) drawn today and your tests are completely normal, you will receive your results only by: Marland Kitchen MyChart Message (if you have MyChart) OR . A paper copy in the mail If you have any lab test that is abnormal or we need to change your treatment, we will call you to review the results.  Testing/Procedures: EKG Today  Follow-Up: At [facility], you and your health needs are our priority.  As part of our continuing mission to provide you with exceptional heart care, we have created designated Provider Care Teams.  These Care Teams include your primary Cardiologist (physician) and Advanced Practice Providers (APPs -  Physician Assistants and Nurse Practitioners) who all work together to provide you with the care you need, when you need it. You will need a follow up appointment in 3 months.   You may see Jenne Campus or another member of our [employer] Provider Team in [place]: Shirlee More, MD . Jyl Heinz, MD

## 2018-11-13 ENCOUNTER — Ambulatory Visit: Payer: Medicare Other | Admitting: Cardiology

## 2018-11-14 DIAGNOSIS — D510 Vitamin B12 deficiency anemia due to intrinsic factor deficiency: Secondary | ICD-10-CM | POA: Diagnosis not present

## 2018-12-20 DIAGNOSIS — M2560 Stiffness of unspecified joint, not elsewhere classified: Secondary | ICD-10-CM | POA: Diagnosis not present

## 2018-12-20 DIAGNOSIS — R293 Abnormal posture: Secondary | ICD-10-CM | POA: Diagnosis not present

## 2018-12-20 DIAGNOSIS — R269 Unspecified abnormalities of gait and mobility: Secondary | ICD-10-CM | POA: Diagnosis not present

## 2018-12-25 DIAGNOSIS — M2560 Stiffness of unspecified joint, not elsewhere classified: Secondary | ICD-10-CM | POA: Diagnosis not present

## 2018-12-25 DIAGNOSIS — R293 Abnormal posture: Secondary | ICD-10-CM | POA: Diagnosis not present

## 2018-12-25 DIAGNOSIS — R269 Unspecified abnormalities of gait and mobility: Secondary | ICD-10-CM | POA: Diagnosis not present

## 2018-12-27 DIAGNOSIS — D510 Vitamin B12 deficiency anemia due to intrinsic factor deficiency: Secondary | ICD-10-CM | POA: Diagnosis not present

## 2018-12-28 DIAGNOSIS — M2560 Stiffness of unspecified joint, not elsewhere classified: Secondary | ICD-10-CM | POA: Diagnosis not present

## 2018-12-28 DIAGNOSIS — R269 Unspecified abnormalities of gait and mobility: Secondary | ICD-10-CM | POA: Diagnosis not present

## 2018-12-28 DIAGNOSIS — R293 Abnormal posture: Secondary | ICD-10-CM | POA: Diagnosis not present

## 2019-01-01 DIAGNOSIS — R293 Abnormal posture: Secondary | ICD-10-CM | POA: Diagnosis not present

## 2019-01-01 DIAGNOSIS — M2560 Stiffness of unspecified joint, not elsewhere classified: Secondary | ICD-10-CM | POA: Diagnosis not present

## 2019-01-01 DIAGNOSIS — R269 Unspecified abnormalities of gait and mobility: Secondary | ICD-10-CM | POA: Diagnosis not present

## 2019-01-04 DIAGNOSIS — R269 Unspecified abnormalities of gait and mobility: Secondary | ICD-10-CM | POA: Diagnosis not present

## 2019-01-04 DIAGNOSIS — M2560 Stiffness of unspecified joint, not elsewhere classified: Secondary | ICD-10-CM | POA: Diagnosis not present

## 2019-01-04 DIAGNOSIS — R293 Abnormal posture: Secondary | ICD-10-CM | POA: Diagnosis not present

## 2019-01-08 DIAGNOSIS — R269 Unspecified abnormalities of gait and mobility: Secondary | ICD-10-CM | POA: Diagnosis not present

## 2019-01-08 DIAGNOSIS — M2560 Stiffness of unspecified joint, not elsewhere classified: Secondary | ICD-10-CM | POA: Diagnosis not present

## 2019-01-08 DIAGNOSIS — R293 Abnormal posture: Secondary | ICD-10-CM | POA: Diagnosis not present

## 2019-01-11 DIAGNOSIS — M2560 Stiffness of unspecified joint, not elsewhere classified: Secondary | ICD-10-CM | POA: Diagnosis not present

## 2019-01-11 DIAGNOSIS — R293 Abnormal posture: Secondary | ICD-10-CM | POA: Diagnosis not present

## 2019-01-11 DIAGNOSIS — R269 Unspecified abnormalities of gait and mobility: Secondary | ICD-10-CM | POA: Diagnosis not present

## 2019-01-16 DIAGNOSIS — R269 Unspecified abnormalities of gait and mobility: Secondary | ICD-10-CM | POA: Diagnosis not present

## 2019-01-16 DIAGNOSIS — R293 Abnormal posture: Secondary | ICD-10-CM | POA: Diagnosis not present

## 2019-01-16 DIAGNOSIS — M2560 Stiffness of unspecified joint, not elsewhere classified: Secondary | ICD-10-CM | POA: Diagnosis not present

## 2019-01-24 DIAGNOSIS — Z23 Encounter for immunization: Secondary | ICD-10-CM | POA: Diagnosis not present

## 2019-02-05 DIAGNOSIS — D519 Vitamin B12 deficiency anemia, unspecified: Secondary | ICD-10-CM | POA: Diagnosis not present

## 2019-02-09 ENCOUNTER — Encounter: Payer: Self-pay | Admitting: Cardiology

## 2019-02-09 ENCOUNTER — Other Ambulatory Visit: Payer: Self-pay

## 2019-02-09 ENCOUNTER — Ambulatory Visit (INDEPENDENT_AMBULATORY_CARE_PROVIDER_SITE_OTHER): Payer: Medicare Other | Admitting: Cardiology

## 2019-02-09 VITALS — BP 140/70 | HR 76 | Ht 69.0 in | Wt 192.2 lb

## 2019-02-09 DIAGNOSIS — R0789 Other chest pain: Secondary | ICD-10-CM

## 2019-02-09 DIAGNOSIS — I517 Cardiomegaly: Secondary | ICD-10-CM

## 2019-02-09 DIAGNOSIS — E7800 Pure hypercholesterolemia, unspecified: Secondary | ICD-10-CM

## 2019-02-09 NOTE — Progress Notes (Signed)
Cardiology Office Note:    Date:  02/09/2019   ID:  Ryan Ball, DOB 07/17/1930, MRN [id number]  PCP:  Angelina Sheriff, MD  Cardiologist:  Jenne Campus, MD    Referring MD: Angelina Sheriff, MD   Chief Complaint  Patient presents with  . Follow-up  Doing well  History of Present Illness:    Ryan Ball is a 85 y.o. male was referred to me because of atypical chest pain.  We did stress test which was negative.  He is very active he walks and have no difficulty doing it however also described to have some atypical pains he said that 1 day he had to move some humidifier at his home he said he got for humidifier each weight about 70 to 80 pounds when he was moving does humidifiers he had no problem but next day he felt something uneasy in his chest for about 2 days.  At the same time he can walk on the track and have no problem doing it.  Overall psychologically it looks like he is doing better last time I spoke to him he was still grieving his wife passing.  However he spent some time with his grandchildren and doing much better from that point review.  No past medical history on file.    Current Medications: Current Meds  Medication Sig  . alendronate (FOSAMAX) 10 MG tablet Take 1 tablet by mouth daily.  . Cholecalciferol (REPLESTA) 1.25 MG (50000 UT) WAFR Take 1 Wafer by mouth every 14 (fourteen) days.  Marland Kitchen doxepin (SINEQUAN) 25 MG capsule Take 1 capsule by mouth at bedtime as needed.  . rosuvastatin (CRESTOR) 5 MG tablet Take 1 tablet by mouth every other day.      Allergies:   Patient has no known allergies.   Social History   Socioeconomic History  . Marital status: Married    Spouse name: Not on file  . Number of children: Not on file  . Years of education: Not on file  . Highest education level: Not on file  Occupational History  . Not on file  Social Needs  . Financial resource strain: Not on file  . Food insecurity    Worry: Not on file   Inability: Not on file  . Transportation needs    Medical: Not on file    Non-medical: Not on file  Tobacco Use  . Smoking status: Never Smoker  . Smokeless tobacco: Never Used  Substance and Sexual Activity  . Alcohol use: Yes    Alcohol/week: 6.0 standard drinks    Types: 6 Glasses of wine per week    Comment: 1-2 glasses of wine 2-3 times a week  . Drug use: Never  . Sexual activity: Not on file  Lifestyle  . Physical activity    Days per week: Not on file    Minutes per session: Not on file  . Stress: Not on file  Relationships  . Social Herbalist on phone: Not on file    Gets together: Not on file    Attends religious service: Not on file    Active member of club or organization: Not on file    Attends meetings of clubs or organizations: Not on file    Relationship status: Not on file  Other Topics Concern  . Not on file  Social History Narrative  . Not on file     Family History: The patient's family history includes CAD in  his brother and father. ROS:   Please see the history of present illness.    All 14 point review of systems negative except as described per history of present illness  EKGs/Labs/Other Studies Reviewed:      Recent Labs: No results found for requested labs within last 8760 hours.  Recent Lipid Panel No results found for: CHOL, TRIG, HDL, CHOLHDL, VLDL, LDLCALC, LDLDIRECT  Physical Exam:    VS:  BP 140/70   Pulse 76   Ht 5\' 9"  (1.753 m)   Wt 192 lb 3.2 oz (87.2 kg)   SpO2 98%   BMI 28.38 kg/m     Wt Readings from Last 3 Encounters:  02/09/19 192 lb 3.2 oz (87.2 kg)  11/09/18 195 lb 3.2 oz (88.5 kg)  11/07/18 193 lb (87.5 kg)     GEN:  Well nourished, well developed in no acute distress HEENT: Normal NECK: No JVD; No carotid bruits LYMPHATICS: No lymphadenopathy CARDIAC: RRR, no murmurs, no rubs, no gallops RESPIRATORY:  Clear to auscultation without rales, wheezing or rhonchi  ABDOMEN: Soft, non-tender,  non-distended MUSCULOSKELETAL:  No edema; No deformity  SKIN: Warm and dry LOWER EXTREMITIES: no swelling NEUROLOGIC:  Alert and oriented x 3 PSYCHIATRIC:  Normal affect   ASSESSMENT:    1. Atypical chest pain   2. Hypercholesteremia   3. Cardiomegaly    PLAN:    In order of problems listed above:  1. Atypical chest pain.  Not related to exercise.  Stress test negative.  We will continue conservative approach.  He told me again he does not want to take medication he does not want to change anything. 2. Dyslipidemia that be followed by internal medicine team.  He is taking 5 mg Crestor which I will continue.  He is reluctant to continue.  I told him that this is important to continue this medication. 3. Cardiomegaly last echocardiogram showed normal    Medication Adjustments/Labs and Tests Ordered: Current medicines are reviewed at length with the patient today.  Concerns regarding medicines are outlined above.  No orders of the defined types were placed in this encounter.  Medication changes: No orders of the defined types were placed in this encounter.   Signed, Park Liter, MD, [facility] 02/09/2019 11:40 AM    [place]

## 2019-02-09 NOTE — Patient Instructions (Signed)
Medication Instructions:  Your physician recommends that you continue on your current medications as directed. Please refer to the Current Medication list given to you today.  If you need a refill on your cardiac medications before your next appointment, please call your pharmacy.   Lab work: None ordered If you have labs (blood work) drawn today and your tests are completely normal, you will receive your results only by: Marland Kitchen MyChart Message (if you have MyChart) OR . A paper copy in the mail If you have any lab test that is abnormal or we need to change your treatment, we will call you to review the results.  Testing/Procedures: None ordered  Follow-Up: At [facility], you and your health needs are our priority.  As part of our continuing mission to provide you with exceptional heart care, we have created designated Provider Care Teams.  These Care Teams include your primary Cardiologist (physician) and Advanced Practice Providers (APPs -  Physician Assistants and Nurse Practitioners) who all work together to provide you with the care you need, when you need it. You will need a follow up appointment in 6 months.  Please call our office 2 months in advance to schedule this appointment.  You may see  Jenne Campus or another member of our [employer] Provider Team in [place]: Shirlee More, MD . Jyl Heinz, MD  Any Other Special Instructions Will Be Listed Below (If Applicable).  We will mail you a letter or call you to schedule your follow up appointment.

## 2019-02-10 DIAGNOSIS — D044 Carcinoma in situ of skin of scalp and neck: Secondary | ICD-10-CM | POA: Diagnosis not present

## 2019-02-10 DIAGNOSIS — L570 Actinic keratosis: Secondary | ICD-10-CM | POA: Diagnosis not present

## 2019-03-26 DIAGNOSIS — D519 Vitamin B12 deficiency anemia, unspecified: Secondary | ICD-10-CM | POA: Diagnosis not present

## 2019-06-28 DIAGNOSIS — D519 Vitamin B12 deficiency anemia, unspecified: Secondary | ICD-10-CM | POA: Diagnosis not present

## 2019-08-13 ENCOUNTER — Other Ambulatory Visit: Payer: Self-pay

## 2019-08-13 ENCOUNTER — Ambulatory Visit (INDEPENDENT_AMBULATORY_CARE_PROVIDER_SITE_OTHER): Payer: Medicare Other | Admitting: Cardiology

## 2019-08-13 ENCOUNTER — Ambulatory Visit (INDEPENDENT_AMBULATORY_CARE_PROVIDER_SITE_OTHER): Payer: Medicare Other

## 2019-08-13 ENCOUNTER — Encounter: Payer: Self-pay | Admitting: Cardiology

## 2019-08-13 VITALS — BP 148/78 | HR 88 | Ht 69.0 in | Wt 188.0 lb

## 2019-08-13 DIAGNOSIS — K219 Gastro-esophageal reflux disease without esophagitis: Secondary | ICD-10-CM | POA: Diagnosis not present

## 2019-08-13 DIAGNOSIS — I517 Cardiomegaly: Secondary | ICD-10-CM | POA: Diagnosis not present

## 2019-08-13 DIAGNOSIS — R0789 Other chest pain: Secondary | ICD-10-CM | POA: Diagnosis not present

## 2019-08-13 DIAGNOSIS — E7800 Pure hypercholesterolemia, unspecified: Secondary | ICD-10-CM

## 2019-08-13 DIAGNOSIS — I493 Ventricular premature depolarization: Secondary | ICD-10-CM

## 2019-08-13 NOTE — Patient Instructions (Signed)
Medication Instructions:  Your physician recommends that you continue on your current medications as directed. Please refer to the Current Medication list given to you today.  *If you need a refill on your cardiac medications before your next appointment, please call your pharmacy*   Lab Work: None If you have labs (blood work) drawn today and your tests are completely normal, you will receive your results only by: . MyChart Message (if you have MyChart) OR . A paper copy in the mail If you have any lab test that is abnormal or we need to change your treatment, we will call you to review the results.   Testing/Procedures: A zio monitor was ordered today. It will remain on for 7 days. You will then return monitor and event diary in provided box. It takes 1-2 weeks for report to be downloaded and returned to us. We will call you with the results. If monitor falls off or has orange flashing light, please call Zio for further instructions.      Follow-Up: At [facility], you and your health needs are our priority.  As part of our continuing mission to provide you with exceptional heart care, we have created designated Provider Care Teams.  These Care Teams include your primary Cardiologist (physician) and Advanced Practice Providers (APPs -  Physician Assistants and Nurse Practitioners) who all work together to provide you with the care you need, when you need it.  We recommend signing up for the patient portal called "MyChart".  Sign up information is provided on this After Visit Summary.  MyChart is used to connect with patients for Virtual Visits (Telemedicine).  Patients are able to view lab/test results, encounter notes, upcoming appointments, etc.  Non-urgent messages can be sent to your provider as well.   To learn more about what you can do with MyChart, go to [email or web address].com.    Your next appointment:   5 month(s)  The format for your next appointment:   In  Person  Provider:   Robert Krasowski, MD   Other Instructions   

## 2019-08-13 NOTE — Addendum Note (Signed)
Addended by: Ashok Norris on: 08/13/2019 10:03 AM   Modules accepted: Orders

## 2019-08-13 NOTE — Progress Notes (Signed)
Cardiology Office Note:    Date:  08/13/2019   ID:  Ryan Ball, DOB 1931/02/05, MRN [id number]  PCP:  Angelina Sheriff, MD  Cardiologist:  Jenne Campus, MD    Referring MD: Angelina Sheriff, MD   No chief complaint on file. I am doing well  History of Present Illness:    Ryan Ball is a 86 y.o. male with past medical history significant for dyslipidemia, hypertension.  Comes today to my office for follow-up.  Originally referred to [place] because of atypical chest pain not related to exercise.  Stress test was done which showed no evidence of ischemia, echocardiogram showed normal left ventricle ejection fraction without any segmental wall motion maladies.  He comes today for regular follow-up.  Overall he is doing well.  In spite of his age 69 he is still very active.  He walks on a regular basis has no difficulty doing it.  Described to have some shortness of breath and also some balance problem that he need to use stick for it but otherwise seems to be doing well.  History reviewed. No pertinent past medical history.  Past Surgical History:  Procedure Laterality Date  . CHOLECYSTECTOMY      Current Medications: Current Meds  Medication Sig  . alendronate (FOSAMAX) 10 MG tablet Take 1 tablet by mouth daily.  . Cholecalciferol (REPLESTA) 1.25 MG (50000 UT) WAFR Take 1 Wafer by mouth every 14 (fourteen) days.  Marland Kitchen doxepin (SINEQUAN) 25 MG capsule Take 1 capsule by mouth at bedtime as needed.  . rosuvastatin (CRESTOR) 5 MG tablet Take 1 tablet by mouth every other day.      Allergies:   Patient has no known allergies.   Social History   Socioeconomic History  . Marital status: Married    Spouse name: Not on file  . Number of children: Not on file  . Years of education: Not on file  . Highest education level: Not on file  Occupational History  . Not on file  Tobacco Use  . Smoking status: Never Smoker  . Smokeless tobacco: Never Used  Substance and Sexual  Activity  . Alcohol use: Yes    Alcohol/week: 6.0 standard drinks    Types: 6 Glasses of wine per week    Comment: 1-2 glasses of wine 2-3 times a week  . Drug use: Never  . Sexual activity: Not on file  Other Topics Concern  . Not on file  Social History Narrative  . Not on file   Social Determinants of Health   Financial Resource Strain:   . Difficulty of Paying Living Expenses:   Food Insecurity:   . Worried About Charity fundraiser in the Last Year:   . Arboriculturist in the Last Year:   Transportation Needs:   . Film/video editor (Medical):   Marland Kitchen Lack of Transportation (Non-Medical):   Physical Activity:   . Days of Exercise per Week:   . Minutes of Exercise per Session:   Stress:   . Feeling of Stress :   Social Connections:   . Frequency of Communication with Friends and Family:   . Frequency of Social Gatherings with Friends and Family:   . Attends Religious Services:   . Active Member of Clubs or Organizations:   . Attends Archivist Meetings:   Marland Kitchen Marital Status:      Family History: The patient's family history includes CAD in his brother and father. ROS:  Please see the history of present illness.    All 14 point review of systems negative except as described per history of present illness  EKGs/Labs/Other Studies Reviewed:      Recent Labs: No results found for requested labs within last 8760 hours.  Recent Lipid Panel No results found for: CHOL, TRIG, HDL, CHOLHDL, VLDL, LDLCALC, LDLDIRECT  Physical Exam:    VS:  BP (!) 148/78   Pulse 88   Ht 5\' 9"  (1.753 m)   Wt 188 lb (85.3 kg)   SpO2 100%   BMI 27.76 kg/m     Wt Readings from Last 3 Encounters:  08/13/19 188 lb (85.3 kg)  02/09/19 192 lb 3.2 oz (87.2 kg)  11/09/18 195 lb 3.2 oz (88.5 kg)     GEN:  Well nourished, well developed in no acute distress HEENT: Normal NECK: No JVD; No carotid bruits LYMPHATICS: No lymphadenopathy CARDIAC: RRR, no murmurs, no rubs, no  gallops RESPIRATORY:  Clear to auscultation without rales, wheezing or rhonchi  ABDOMEN: Soft, non-tender, non-distended MUSCULOSKELETAL:  No edema; No deformity  SKIN: Warm and dry LOWER EXTREMITIES: no swelling NEUROLOGIC:  Alert and oriented x 3 PSYCHIATRIC:  Normal affect   ASSESSMENT:    1. Atypical chest pain   2. Hypercholesteremia   3. Cardiomegaly   4. Gastroesophageal reflux disease, unspecified whether esophagitis present    PLAN:    In order of problems listed above:  1. Atypical chest pain stress test negative.  Denies having any.  Continue present management. 2. Dyslipidemia.  I wanted to do his fasting profile today but told me today that his GERD is minimal I do have last fasting lipid profile from May 2020.  I did review K PN and I find out LDL of 93.  He does not want to do anything about it. 3. Cardiomegaly, echocardiogram showed preserved normal left ventricle ejection fraction with normal size 4. Gastroesophageal reflux disease denies having any problem from that aspect. 5. His EKG today showed frequent supraventricular ectopy with some PVCs as well.  I will asked him to wear Zio patch monitor he does not have paroxysmal atrial fibrillation.  Also I would like to determine the burden of this arrhythmia.   Medication Adjustments/Labs and Tests Ordered: Current medicines are reviewed at length with the patient today.  Concerns regarding medicines are outlined above.  No orders of the defined types were placed in this encounter.  Medication changes: No orders of the defined types were placed in this encounter.   Signed, Park Liter, MD, [facility] 08/13/2019 9:50 AM    [place]

## 2019-08-20 DIAGNOSIS — D510 Vitamin B12 deficiency anemia due to intrinsic factor deficiency: Secondary | ICD-10-CM | POA: Diagnosis not present

## 2019-08-21 DIAGNOSIS — I499 Cardiac arrhythmia, unspecified: Secondary | ICD-10-CM | POA: Diagnosis not present

## 2019-08-21 DIAGNOSIS — Z6829 Body mass index (BMI) 29.0-29.9, adult: Secondary | ICD-10-CM | POA: Diagnosis not present

## 2019-08-21 DIAGNOSIS — R1310 Dysphagia, unspecified: Secondary | ICD-10-CM | POA: Diagnosis not present

## 2019-08-21 DIAGNOSIS — Z9109 Other allergy status, other than to drugs and biological substances: Secondary | ICD-10-CM | POA: Diagnosis not present

## 2019-08-21 DIAGNOSIS — L821 Other seborrheic keratosis: Secondary | ICD-10-CM | POA: Diagnosis not present

## 2019-08-21 DIAGNOSIS — L814 Other melanin hyperpigmentation: Secondary | ICD-10-CM | POA: Diagnosis not present

## 2019-08-21 DIAGNOSIS — R4702 Dysphasia: Secondary | ICD-10-CM | POA: Diagnosis not present

## 2019-08-21 DIAGNOSIS — L570 Actinic keratosis: Secondary | ICD-10-CM | POA: Diagnosis not present

## 2019-08-22 DIAGNOSIS — R4702 Dysphasia: Secondary | ICD-10-CM | POA: Diagnosis not present

## 2019-08-31 DIAGNOSIS — I493 Ventricular premature depolarization: Secondary | ICD-10-CM | POA: Diagnosis not present

## 2019-09-05 ENCOUNTER — Telehealth: Payer: Self-pay

## 2019-09-05 NOTE — Telephone Encounter (Signed)
Left message on patients voicemail to please return our call.   

## 2019-09-05 NOTE — Telephone Encounter (Signed)
-----   Message from Park Liter, MD sent at 09/05/2019  3:37 PM EDT ----- Monitor showed multiple episode of supraventricular tachycardia.  I suggest this might start a very small dose of beta-blocker we will start with metoprolol succinate 25 mg daily

## 2019-09-07 NOTE — Telephone Encounter (Signed)
Spoke with patient regarding his monitor results, patient is hesitant to take additional medication, also he has "a blockage" in his throat and has difficulty swallowing medications.Pateint would like to speak with Dr Agustin Cree.    Please call patient after 9:30 AM.

## 2019-09-07 NOTE — Telephone Encounter (Signed)
Patient returned a call from our office about test results.  

## 2019-09-10 NOTE — Telephone Encounter (Signed)
I am afraid we need to schedule a follow-up appointment to discuss this.

## 2019-09-11 ENCOUNTER — Telehealth: Payer: Self-pay | Admitting: Cardiology

## 2019-09-11 NOTE — Telephone Encounter (Signed)
Left another message for patient to return call.

## 2019-09-11 NOTE — Telephone Encounter (Signed)
Please see additional telephone encounter.

## 2019-09-11 NOTE — Telephone Encounter (Signed)
Patient scheduled for follow up appointment.

## 2019-09-11 NOTE — Telephone Encounter (Signed)
Patient states he is returning a call from someone this morning. He states he does not know what the call was about. I did not see a note.

## 2019-09-11 NOTE — Telephone Encounter (Signed)
Left message for patient to return call.

## 2019-09-14 ENCOUNTER — Ambulatory Visit (INDEPENDENT_AMBULATORY_CARE_PROVIDER_SITE_OTHER): Payer: Medicare Other | Admitting: Otolaryngology

## 2019-09-18 ENCOUNTER — Other Ambulatory Visit (INDEPENDENT_AMBULATORY_CARE_PROVIDER_SITE_OTHER): Payer: Self-pay

## 2019-09-18 ENCOUNTER — Other Ambulatory Visit: Payer: Self-pay

## 2019-09-18 ENCOUNTER — Encounter (INDEPENDENT_AMBULATORY_CARE_PROVIDER_SITE_OTHER): Payer: Self-pay | Admitting: Otolaryngology

## 2019-09-18 ENCOUNTER — Ambulatory Visit (INDEPENDENT_AMBULATORY_CARE_PROVIDER_SITE_OTHER): Payer: Medicare Other | Admitting: Otolaryngology

## 2019-09-18 VITALS — Temp 98.1°F

## 2019-09-18 DIAGNOSIS — R1314 Dysphagia, pharyngoesophageal phase: Secondary | ICD-10-CM

## 2019-09-18 DIAGNOSIS — D3702 Neoplasm of uncertain behavior of tongue: Secondary | ICD-10-CM

## 2019-09-18 NOTE — Progress Notes (Signed)
HPI: Ryan Ball is a 86 y.o. male who presents is referred by [facility] family physicians for evaluation of raspy voice and swallowing difficulty.  Patient presents today with his daughter.  They have noticed gradual decline of quality of his voice with a husky type voice that has been developing gradually over the past several weeks.  He has also complained of some difficulty swallowing in food and pills getting caught in his upper throat.  He was prescribed antiacid medication Nexium that he took for a few days but is not taking presently.  He does have history of hiatal hernia and occasional heartburn..  No past medical history on file. Past Surgical History:  Procedure Laterality Date  . CHOLECYSTECTOMY     Social History   Socioeconomic History  . Marital status: Married    Spouse name: Not on file  . Number of children: Not on file  . Years of education: Not on file  . Highest education level: Not on file  Occupational History  . Not on file  Tobacco Use  . Smoking status: Never Smoker  . Smokeless tobacco: Never Used  Substance and Sexual Activity  . Alcohol use: Yes    Alcohol/week: 6.0 standard drinks    Types: 6 Glasses of wine per week    Comment: 1-2 glasses of wine 2-3 times a week  . Drug use: Never  . Sexual activity: Not on file  Other Topics Concern  . Not on file  Social History Narrative  . Not on file   Social Determinants of Health   Financial Resource Strain:   . Difficulty of Paying Living Expenses:   Food Insecurity:   . Worried About Charity fundraiser in the Last Year:   . Arboriculturist in the Last Year:   Transportation Needs:   . Film/video editor (Medical):   Marland Kitchen Lack of Transportation (Non-Medical):   Physical Activity:   . Days of Exercise per Week:   . Minutes of Exercise per Session:   Stress:   . Feeling of Stress :   Social Connections:   . Frequency of Communication with Friends and Family:   . Frequency of Social  Gatherings with Friends and Family:   . Attends Religious Services:   . Active Member of Clubs or Organizations:   . Attends Archivist Meetings:   Marland Kitchen Marital Status:    Family History  Problem Relation Age of Onset  . CAD Father   . CAD Brother    No Known Allergies Prior to Admission medications   Medication Sig Start Date End Date Taking? Authorizing Provider  alendronate (FOSAMAX) 10 MG tablet Take 1 tablet by mouth daily. 01/17/19   [provider]  Cholecalciferol (REPLESTA) 1.25 MG (50000 UT) WAFR Take 1 Wafer by mouth every 14 (fourteen) days.    [provider]  doxepin (SINEQUAN) 25 MG capsule Take 1 capsule by mouth at bedtime as needed. 04/17/18   [provider]  rosuvastatin (CRESTOR) 5 MG tablet Take 1 tablet by mouth every other day.  01/08/10   [provider]     Positive ROS: Otherwise negative  All other systems have been reviewed and were otherwise negative with the exception of those mentioned in the HPI and as above.  Physical Exam: Constitutional: Alert, well-appearing, no acute distress Ears: External ears without lesions or tenderness. Ear canals are clear bilaterally with intact, clear TMs.  Nasal: External nose without lesions. Septum midline.  Clear nasal passages bilaterally. Oral: Lips and gums without lesions. Tongue and palate mucosa without lesions. Posterior oropharynx clear.  He is status post tonsillectomy.  On indirect laryngoscopy patient has a large swollen mass along the posterior tongue base more so on the left side.  This is soft to palpation it is consistent with possible base of tongue cyst versus base of tongue tonsillar tissue. Fiberoptic laryngoscopy was performed to the left nostril.  Nasopharynx was clear.  Again noted is a base of tongue mass that narrows the hypopharynx in this region.  After passing the fiberoptic laryngoscope through this area the remaining of the larynx was clear with  normal-appearing vocal cords and epiglottis.  Piriform sinuses were clear bilaterally. Neck: No palpable adenopathy or masses noted in the neck. Respiratory: Breathing comfortably  Skin: No facial/neck lesions or rash noted.  Laryngoscopy  Date/Time: 09/18/2019 5:45 PM Performed by: Rozetta Nunnery, MD Authorized by: Rozetta Nunnery, MD   Consent:    Consent obtained:  Verbal   Consent given by:  Patient Procedure details:    Indications: hoarseness, dysphagia, or aspiration     Medication:  Afrin   Instrument: flexible fiberoptic laryngoscope     Scope location: left nare   Sinus:    Left nasopharynx: normal   Mouth:    Epiglottis: normal   Throat:    True vocal cords: normal   Comments:     On fiberoptic laryngoscopy to the left nostril patient has left base of tongue fullness mass or enlarged cyst involving the left base of tongue tonsillar region.  This is soft to palpation.  But does partially obstruct the hypopharynx.  Vocal cords are clear and glottis is clear with normal vocal cord mobility.    Assessment: Base of tongue fullness questionable etiology.  Questionable cyst. Hypopharyngeal esophageal dysphagia  Plan: We will plan on scheduling a CT scan of the neck and plan on scheduling a barium swallow to evaluate base of tongue mass and swallowing difficulty. Patient will follow up here following the CT scan and barium swallow to discuss possible surgical options.   Radene Journey, MD   CC:

## 2019-09-19 ENCOUNTER — Other Ambulatory Visit: Payer: Self-pay

## 2019-09-20 ENCOUNTER — Encounter: Payer: Self-pay | Admitting: Cardiology

## 2019-09-20 ENCOUNTER — Ambulatory Visit (INDEPENDENT_AMBULATORY_CARE_PROVIDER_SITE_OTHER): Payer: Medicare Other | Admitting: Cardiology

## 2019-09-20 ENCOUNTER — Other Ambulatory Visit (INDEPENDENT_AMBULATORY_CARE_PROVIDER_SITE_OTHER): Payer: Self-pay

## 2019-09-20 ENCOUNTER — Other Ambulatory Visit: Payer: Self-pay

## 2019-09-20 VITALS — BP 122/76 | HR 86 | Temp 98.7°F | Ht 69.0 in | Wt 181.0 lb

## 2019-09-20 DIAGNOSIS — D3702 Neoplasm of uncertain behavior of tongue: Secondary | ICD-10-CM

## 2019-09-20 DIAGNOSIS — K219 Gastro-esophageal reflux disease without esophagitis: Secondary | ICD-10-CM | POA: Diagnosis not present

## 2019-09-20 DIAGNOSIS — I471 Supraventricular tachycardia, unspecified: Secondary | ICD-10-CM

## 2019-09-20 DIAGNOSIS — R0789 Other chest pain: Secondary | ICD-10-CM

## 2019-09-20 DIAGNOSIS — E7800 Pure hypercholesterolemia, unspecified: Secondary | ICD-10-CM

## 2019-09-20 HISTORY — DX: Supraventricular tachycardia: I47.1

## 2019-09-20 HISTORY — DX: Supraventricular tachycardia, unspecified: I47.10

## 2019-09-20 NOTE — Patient Instructions (Signed)
Medication Instructions:  Your physician recommends that you continue on your current medications as directed. Please refer to the Current Medication list given to you today.  *If you need a refill on your cardiac medications before your next appointment, please call your pharmacy*    Lab Work: None.   If you have labs (blood work) drawn today and your tests are completely normal, you will receive your results only by: Marland Kitchen MyChart Message (if you have MyChart) OR . A paper copy in the mail If you have any lab test that is abnormal or we need to change your treatment, we will call you to review the results.   Testing/Procedures: None.   Follow-Up: At [facility], you and your health needs are our priority.  As part of our continuing mission to provide you with exceptional heart care, we have created designated Provider Care Teams.  These Care Teams include your primary Cardiologist (physician) and Advanced Practice Providers (APPs -  Physician Assistants and Nurse Practitioners) who all work together to provide you with the care you need, when you need it.  We recommend signing up for the patient portal called "MyChart".  Sign up information is provided on this After Visit Summary.  MyChart is used to connect with patients for Virtual Visits (Telemedicine).  Patients are able to view lab/test results, encounter notes, upcoming appointments, etc.  Non-urgent messages can be sent to your provider as well.   To learn more about what you can do with MyChart, go to [email or web address].    Your next appointment:   3 month(s)  The format for your next appointment:   In Person  Provider:   Jenne Campus, MD   Other Instructions  Dr. Agustin Cree has referred you to see Dr. Curt Bears with electrophysiology.

## 2019-09-20 NOTE — Progress Notes (Signed)
Cardiology Office Note:    Date:  09/20/2019   ID:  [name], DOB 1930-09-19, MRN [id number]  PCP:  Angelina Sheriff, MD  Cardiologist:  Jenne Campus, MD    Referring MD: Angelina Sheriff, MD   Chief Complaint  Patient presents with  . Follow-up    Discuss Monitor Results   -Doing well  History of Present Illness:    Ryan Ball is a 86 y.o. male who initially was referred to [place] because of atypical chest pain.  He does have multiple risk factors for coronary artery disease therefore we did stress test to rule out significant obstructive disease.  Luckily stress test came normal without evidence of ischemia.  As a part of evaluation he had echocardiogram done which showed preserved left ventricle ejection fraction without significant valvular pathology.  He also got some palpitation for which I put event recorder.  Event recorder showed multiple runs of narrow complex tachycardia so I am somewhat irregular raising suspicion for atrial fibrillation.  He is completely asymptomatic with that.  I wanted him to start small dose of beta-blocker however he refused.  I brought him today to do office to talk about it.  He got multiple explanation why he does not want to take medication one of the augmentation which is very reasonable is the fact that he is 87 years old has been living happily until then he is still very active still exercise on the regular basis he simply does very well at this point he does not want to take any medications.  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  He had difficulty swallowing pills.  Likely overall he is doing well.  Denies have any palpitation chest pain tightness squeezing pressure burning chest dizziness.  No past medical history on file.  Past Surgical History:  Procedure Laterality Date  . CHOLECYSTECTOMY      Current  Medications: Current Meds  Medication Sig  . alendronate (FOSAMAX) 10 MG tablet Take 1 tablet by mouth daily.  . Cholecalciferol (REPLESTA) 1.25 MG (50000 UT) WAFR Take 1 Wafer by mouth every 14 (fourteen) days.  Marland Kitchen doxepin (SINEQUAN) 25 MG capsule Take 1 capsule by mouth at bedtime as needed.  . rosuvastatin (CRESTOR) 5 MG tablet Take 1 tablet by mouth every other day.      Allergies:   Patient has no known allergies.   Social History   Socioeconomic History  . Marital status: Married    Spouse name: Not on file  . Number of children: Not on file  . Years of education: Not on file  . Highest education level: Not on file  Occupational History  . Not on file  Tobacco Use  . Smoking status: Never Smoker  . Smokeless tobacco: Never Used  Substance and Sexual Activity  . Alcohol use: Yes    Alcohol/week: 6.0 standard drinks    Types: 6 Glasses of wine per week    Comment: 1-2 glasses of wine 2-3 times a week  . Drug use: Never  . Sexual activity: Not on file  Other Topics Concern  . Not on file  Social History Narrative  . Not on file   Social Determinants of Health   Financial Resource Strain:   . Difficulty of Paying Living Expenses:   Food Insecurity:   . Worried About Charity fundraiser in the Last Year:   .  Ran Out of Food in the Last Year:   Transportation Needs:   . Film/video editor (Medical):   Marland Kitchen Lack of Transportation (Non-Medical):   Physical Activity:   . Days of Exercise per Week:   . Minutes of Exercise per Session:   Stress:   . Feeling of Stress :   Social Connections:   . Frequency of Communication with Friends and Family:   . Frequency of Social Gatherings with Friends and Family:   . Attends Religious Services:   . Active Member of Clubs or Organizations:   . Attends Archivist Meetings:   Marland Kitchen Marital Status:      Family History: The patient's family history includes CAD in his brother and father. ROS:   Please see the history  of present illness.    All 14 point review of systems negative except as described per history of present illness  EKGs/Labs/Other Studies Reviewed:      Recent Labs: No results found for requested labs within last 8760 hours.  Recent Lipid Panel No results found for: CHOL, TRIG, HDL, CHOLHDL, VLDL, LDLCALC, LDLDIRECT  Physical Exam:    VS:  BP 122/76   Pulse 86   Temp 98.7 F (37.1 C)   Ht 5\' 9"  (1.753 m)   Wt 181 lb (82.1 kg)   SpO2 99%   BMI 26.73 kg/m     Wt Readings from Last 3 Encounters:  09/20/19 181 lb (82.1 kg)  08/13/19 188 lb (85.3 kg)  02/09/19 192 lb 3.2 oz (87.2 kg)     GEN:  Well nourished, well developed in no acute distress HEENT: Normal NECK: No JVD; No carotid bruits LYMPHATICS: No lymphadenopathy CARDIAC: RRR, no murmurs, no rubs, no gallops RESPIRATORY:  Clear to auscultation without rales, wheezing or rhonchi  ABDOMEN: Soft, non-tender, non-distended MUSCULOSKELETAL:  No edema; No deformity  SKIN: Warm and dry LOWER EXTREMITIES: no swelling NEUROLOGIC:  Alert and oriented x 3 PSYCHIATRIC:  Normal affect   ASSESSMENT:    1. Atypical chest pain   2. SVT (supraventricular tachycardia) ([place])   3. Hypercholesteremia   4. Gastroesophageal reflux disease, unspecified whether esophagitis present    PLAN:    In order of problems listed above:  1. Atypical chest pain stress test negative. 2. Multiple runs of narrow complex tachycardia some rising suspicion for atrial fibrillation.  He does not want to take any medications for it.  I will refer him to EP team for evaluation and for advice regarding his supraventricular tachycardia. 3. Dyslipidemia: He is taking Crestor 5 mg which I will continue.  I did review K PN from last year actually show LDL is 93 and HDL 57.  This is acceptable cholesterol for his risk profile. 4. Gastroesophageal reflux disease.  Managed by GI   Medication Adjustments/Labs and Tests Ordered: Current medicines are  reviewed at length with the patient today.  Concerns regarding medicines are outlined above.  No orders of the defined types were placed in this encounter.  Medication changes: No orders of the defined types were placed in this encounter.   Signed, Park Liter, MD, [facility] 09/20/2019 10:11 AM    [place]

## 2019-09-27 ENCOUNTER — Encounter (INDEPENDENT_AMBULATORY_CARE_PROVIDER_SITE_OTHER): Payer: Self-pay

## 2019-09-27 NOTE — Progress Notes (Unsigned)
Today I faxed lab orders to [facility] in [place], [place] for pt to have Creatinine Code [id number].

## 2019-09-28 DIAGNOSIS — D3702 Neoplasm of uncertain behavior of tongue: Secondary | ICD-10-CM | POA: Diagnosis not present

## 2019-10-04 ENCOUNTER — Inpatient Hospital Stay (HOSPITAL_COMMUNITY): Admission: RE | Admit: 2019-10-04 | Payer: Medicare Other | Source: Ambulatory Visit

## 2019-10-04 ENCOUNTER — Ambulatory Visit (HOSPITAL_COMMUNITY)
Admission: RE | Admit: 2019-10-04 | Discharge: 2019-10-04 | Disposition: A | Discharge status: Home / Self Care | Payer: Medicare Other | Source: Ambulatory Visit | Attending: Otolaryngology | Admitting: Otolaryngology

## 2019-10-04 ENCOUNTER — Ambulatory Visit (HOSPITAL_COMMUNITY): Payer: Medicare Other

## 2019-10-04 ENCOUNTER — Other Ambulatory Visit (INDEPENDENT_AMBULATORY_CARE_PROVIDER_SITE_OTHER): Payer: Self-pay | Admitting: Otolaryngology

## 2019-10-04 ENCOUNTER — Other Ambulatory Visit: Payer: Self-pay

## 2019-10-04 DIAGNOSIS — D3702 Neoplasm of uncertain behavior of tongue: Secondary | ICD-10-CM

## 2019-10-04 DIAGNOSIS — K148 Other diseases of tongue: Secondary | ICD-10-CM | POA: Diagnosis not present

## 2019-10-04 DIAGNOSIS — K224 Dyskinesia of esophagus: Secondary | ICD-10-CM | POA: Diagnosis not present

## 2019-10-04 MED ORDER — SODIUM CHLORIDE (PF) 0.9 % IJ SOLN
INTRAMUSCULAR | Status: AC
  Filled 2019-10-04: qty 50

## 2019-10-04 MED ORDER — IOHEXOL 300 MG/ML  SOLN
75.0000 mL | Freq: Once | INTRAMUSCULAR | Status: AC | PRN
  Administered 2019-10-04: 75 mL via INTRAVENOUS

## 2019-10-05 DIAGNOSIS — Z6826 Body mass index (BMI) 26.0-26.9, adult: Secondary | ICD-10-CM | POA: Diagnosis not present

## 2019-10-05 DIAGNOSIS — Z0000 Encounter for general adult medical examination without abnormal findings: Secondary | ICD-10-CM | POA: Diagnosis not present

## 2019-10-05 DIAGNOSIS — E7800 Pure hypercholesterolemia, unspecified: Secondary | ICD-10-CM | POA: Diagnosis not present

## 2019-10-05 DIAGNOSIS — D510 Vitamin B12 deficiency anemia due to intrinsic factor deficiency: Secondary | ICD-10-CM | POA: Diagnosis not present

## 2019-10-05 DIAGNOSIS — M8580 Other specified disorders of bone density and structure, unspecified site: Secondary | ICD-10-CM | POA: Diagnosis not present

## 2019-10-09 DIAGNOSIS — Z79899 Other long term (current) drug therapy: Secondary | ICD-10-CM | POA: Diagnosis not present

## 2019-10-09 DIAGNOSIS — E7800 Pure hypercholesterolemia, unspecified: Secondary | ICD-10-CM | POA: Diagnosis not present

## 2019-10-10 NOTE — Telephone Encounter (Signed)
Discussed with Ryan Ball's daughter Ryan Ball concerning results of the CT scan and barium swallow.  The CT scan showed a large approximate 4 cm base of tongue mass.  This could be benign or cancerous although it is relatively soft to palpation. I have referred him to [facility] to see either Dr. Silvio Clayman or Dr. Conley Canal as I suspect this will require endoscopic surgery in order to adequately remove. They will call her in order to arrange an appointment for Ryan Ball.

## 2019-10-17 DIAGNOSIS — C8339 Diffuse large B-cell lymphoma, extranodal and solid organ sites: Secondary | ICD-10-CM | POA: Diagnosis not present

## 2019-10-17 DIAGNOSIS — D3702 Neoplasm of uncertain behavior of tongue: Secondary | ICD-10-CM | POA: Diagnosis not present

## 2019-10-17 DIAGNOSIS — C8331 Diffuse large B-cell lymphoma, lymph nodes of head, face, and neck: Secondary | ICD-10-CM | POA: Diagnosis not present

## 2019-10-22 DIAGNOSIS — C8331 Diffuse large B-cell lymphoma, lymph nodes of head, face, and neck: Secondary | ICD-10-CM | POA: Diagnosis not present

## 2019-10-30 DIAGNOSIS — C8330 Diffuse large B-cell lymphoma, unspecified site: Secondary | ICD-10-CM

## 2019-10-30 HISTORY — DX: Diffuse large B-cell lymphoma, unspecified site: C83.30

## 2019-11-02 DIAGNOSIS — H2513 Age-related nuclear cataract, bilateral: Secondary | ICD-10-CM

## 2019-11-02 HISTORY — DX: Age-related nuclear cataract, bilateral: H25.13

## 2019-11-07 DIAGNOSIS — I340 Nonrheumatic mitral (valve) insufficiency: Secondary | ICD-10-CM

## 2019-11-09 ENCOUNTER — Telehealth: Payer: Self-pay | Admitting: Emergency Medicine

## 2019-11-09 DIAGNOSIS — C8331 Diffuse large B-cell lymphoma, lymph nodes of head, face, and neck: Secondary | ICD-10-CM | POA: Diagnosis not present

## 2019-11-09 NOTE — Telephone Encounter (Signed)
° °  Primary Cardiologist: Jenne Campus, MD  Chart reviewed as part of pre-operative protocol coverage. Given past medical history and time since last visit, based on ACC/AHA guidelines, Ryan Ball would be at acceptable risk for the planned procedure without further cardiovascular testing.   I will route this recommendation to the requesting party via Epic fax function and remove from pre-op pool.  Please call with questions.  Jossie Ng. Hogan Hoobler NP-C    11/09/2019, 8:33 AM [address] Office [phone] Fax [phone]

## 2019-11-09 NOTE — Telephone Encounter (Signed)
° °  [facility] Pre-operative Risk Assessment    HEARTCARE STAFF: - Please ensure there is not already an duplicate clearance open for this procedure. - Under Visit Info/Reason for Call, type in Other and utilize the format Clearance MM/DD/YY or Clearance TBD. Do not use dashes or single digits. - If request is for dental extraction, please clarify the # of teeth to be extracted.  Request for surgical clearance:  1. What type of surgery is being performed?  Port a cath insertion    2. When is this surgery scheduled? 11/16/2019   3. What type of clearance is required (medical clearance vs. Pharmacy clearance to hold med vs. Both)? Medical   4. Are there any medications that need to be held prior to surgery and how long?None specified    5. Practice name and name of physician performing surgery? [facility]- Lockie Mola, MD Melynda Ripple, DO   6. What is the office phone number? [phone]   7.   What is the office fax number? [phone]  8.   Anesthesia type (None, local, MAC, general) ? Nonr Specified    Ryan Ball 11/09/2019, 8:18 AM  _________________________________________________________________   (provider comments below)

## 2019-11-16 DIAGNOSIS — C8330 Diffuse large B-cell lymphoma, unspecified site: Secondary | ICD-10-CM | POA: Diagnosis not present

## 2019-11-19 DIAGNOSIS — C8331 Diffuse large B-cell lymphoma, lymph nodes of head, face, and neck: Secondary | ICD-10-CM | POA: Diagnosis not present

## 2019-11-19 DIAGNOSIS — Z452 Encounter for adjustment and management of vascular access device: Secondary | ICD-10-CM | POA: Diagnosis not present

## 2019-11-20 DIAGNOSIS — C8331 Diffuse large B-cell lymphoma, lymph nodes of head, face, and neck: Secondary | ICD-10-CM | POA: Diagnosis not present

## 2019-11-20 DIAGNOSIS — Z5111 Encounter for antineoplastic chemotherapy: Secondary | ICD-10-CM | POA: Diagnosis not present

## 2019-11-21 DIAGNOSIS — Z5189 Encounter for other specified aftercare: Secondary | ICD-10-CM | POA: Diagnosis not present

## 2019-11-21 DIAGNOSIS — C8331 Diffuse large B-cell lymphoma, lymph nodes of head, face, and neck: Secondary | ICD-10-CM | POA: Diagnosis not present

## 2019-11-23 DIAGNOSIS — C8331 Diffuse large B-cell lymphoma, lymph nodes of head, face, and neck: Secondary | ICD-10-CM | POA: Diagnosis not present

## 2019-12-10 DIAGNOSIS — C8331 Diffuse large B-cell lymphoma, lymph nodes of head, face, and neck: Secondary | ICD-10-CM | POA: Diagnosis not present

## 2019-12-12 DIAGNOSIS — C8331 Diffuse large B-cell lymphoma, lymph nodes of head, face, and neck: Secondary | ICD-10-CM | POA: Diagnosis not present

## 2019-12-12 DIAGNOSIS — Z09 Encounter for follow-up examination after completed treatment for conditions other than malignant neoplasm: Secondary | ICD-10-CM

## 2019-12-12 DIAGNOSIS — Z5189 Encounter for other specified aftercare: Secondary | ICD-10-CM | POA: Diagnosis not present

## 2019-12-12 HISTORY — DX: Encounter for follow-up examination after completed treatment for conditions other than malignant neoplasm: Z09

## 2019-12-13 ENCOUNTER — Encounter (INDEPENDENT_AMBULATORY_CARE_PROVIDER_SITE_OTHER): Payer: Self-pay

## 2019-12-17 ENCOUNTER — Other Ambulatory Visit: Payer: Self-pay

## 2019-12-20 ENCOUNTER — Other Ambulatory Visit: Payer: Self-pay

## 2019-12-21 ENCOUNTER — Encounter: Payer: Self-pay | Admitting: Cardiology

## 2019-12-21 ENCOUNTER — Ambulatory Visit (INDEPENDENT_AMBULATORY_CARE_PROVIDER_SITE_OTHER): Payer: Medicare Other | Admitting: Cardiology

## 2019-12-21 ENCOUNTER — Other Ambulatory Visit: Payer: Self-pay

## 2019-12-21 VITALS — BP 124/72 | HR 150 | Ht 69.0 in | Wt 178.0 lb

## 2019-12-21 DIAGNOSIS — C8331 Diffuse large B-cell lymphoma, lymph nodes of head, face, and neck: Secondary | ICD-10-CM

## 2019-12-21 DIAGNOSIS — I471 Supraventricular tachycardia: Secondary | ICD-10-CM

## 2019-12-21 DIAGNOSIS — R0789 Other chest pain: Secondary | ICD-10-CM | POA: Diagnosis not present

## 2019-12-21 DIAGNOSIS — E7800 Pure hypercholesterolemia, unspecified: Secondary | ICD-10-CM | POA: Diagnosis not present

## 2019-12-21 NOTE — Progress Notes (Signed)
Cardiology Office Note:    Date:  12/21/2019   ID:  Ryan Ball, DOB 1930/10/19, MRN [id number]  PCP:  Ryan Sheriff, MD  Cardiologist:  Ryan Campus, MD    Referring MD: Ryan Sheriff, MD   No chief complaint on file. Doing well  History of Present Illness:    Ryan Ball is a 86 y.o. male with past medical history significant for dyslipidemia, hypertension.  He did have some atypical chest pain and quite extensive evaluation has been done on his carotid trying to figure out if it is related to his heart issue.  Stress test showed no evidence of ischemia echocardiogram showed preserved left ventricle ejection fraction.  He did have monitor which showed some narrow complex tachycardia with some suspicion for atrial fibrillation.  Last time I brought him to the office and we spent a long time talking about this I offered him some medication he does not want to take it he said he is fine he has been living for so many years he does not want to do anything about it.  New development is the 5 that he was find to have B-cell lymphoma.  He does have lymphoma in his base on the tongue.  Chemotherapy has been initiated and he is doing better meaning he is able to swallow now.  I did review note from surgeon as well as oncologist for it.  Past Medical History:  Diagnosis Date  . Atypical chest pain 10/10/2018  . Cardiomegaly 06/08/2016  . Diffuse large B-cell lymphoma ([place]) 10/30/2019  . Esophageal reflux 10/10/2018  . Hypercholesteremia 10/10/2018  . Postoperative examination 12/12/2019  . Senile nuclear sclerosis 05/19/2012  . Senile nuclear sclerosis, bilateral 11/02/2019  . Sleep disturbance 10/10/2018  . SVT (supraventricular tachycardia) ([place]) 09/20/2019    Past Surgical History:  Procedure Laterality Date  . CHOLECYSTECTOMY      Current Medications: Current Meds  Medication Sig  . Cholecalciferol (REPLESTA) 1.25 MG (50000 UT) WAFR Take 1 Wafer by mouth  every 14 (fourteen) days.  Marland Kitchen doxepin (SINEQUAN) 25 MG capsule Take 1 capsule by mouth at bedtime as needed.  Marland Kitchen esomeprazole (NEXIUM) 40 MG capsule Take 40 mg by mouth daily.  . predniSONE (DELTASONE) 20 MG tablet Take by mouth.  . rosuvastatin (CRESTOR) 5 MG tablet Take 1 tablet by mouth every other day.      Allergies:   Patient has no known allergies.   Social History   Socioeconomic History  . Marital status: Widowed    Spouse name: Not on file  . Number of children: Not on file  . Years of education: Not on file  . Highest education level: Not on file  Occupational History  . Not on file  Tobacco Use  . Smoking status: Never Smoker  . Smokeless tobacco: Never Used  Substance and Sexual Activity  . Alcohol use: Yes    Alcohol/week: 6.0 standard drinks    Types: 6 Glasses of wine per week    Comment: 1-2 glasses of wine 2-3 times a week  . Drug use: Never  . Sexual activity: Not on file  Other Topics Concern  . Not on file  Social History Narrative  . Not on file   Social Determinants of Health   Financial Resource Strain:   . Difficulty of Paying Living Expenses:   Food Insecurity:   . Worried About Charity fundraiser in the Last Year:   . [employer] of  Food in the Last Year:   Transportation Needs:   . Film/video editor (Medical):   Marland Kitchen Lack of Transportation (Non-Medical):   Physical Activity:   . Days of Exercise per Week:   . Minutes of Exercise per Session:   Stress:   . Feeling of Stress :   Social Connections:   . Frequency of Communication with Friends and Family:   . Frequency of Social Gatherings with Friends and Family:   . Attends Religious Services:   . Active Member of Clubs or Organizations:   . Attends Archivist Meetings:   Marland Kitchen Marital Status:      Family History: The patient's family history includes CAD in his brother and father. ROS:   Please see the history of present illness.    All 14 point review of systems negative  except as described per history of present illness  EKGs/Labs/Other Studies Reviewed:      Recent Labs: No results found for requested labs within last 8760 hours.  Recent Lipid Panel No results found for: CHOL, TRIG, HDL, CHOLHDL, VLDL, LDLCALC, LDLDIRECT  Physical Exam:    VS:  BP 124/72   Pulse (!) 150   Ht 5\' 9"  (1.753 m)   Wt 178 lb (80.7 kg)   SpO2 94%   BMI 26.29 kg/m     Wt Readings from Last 3 Encounters:  12/21/19 178 lb (80.7 kg)  09/20/19 181 lb (82.1 kg)  08/13/19 188 lb (85.3 kg)     GEN:  Well nourished, well developed in no acute distress HEENT: Normal NECK: No JVD; No carotid bruits LYMPHATICS: No lymphadenopathy CARDIAC: RRR, no murmurs, no rubs, no gallops RESPIRATORY:  Clear to auscultation without rales, wheezing or rhonchi  ABDOMEN: Soft, non-tender, non-distended MUSCULOSKELETAL:  No edema; No deformity  SKIN: Warm and dry LOWER EXTREMITIES: no swelling NEUROLOGIC:  Alert and oriented x 3 PSYCHIATRIC:  Normal affect   ASSESSMENT:    1. SVT (supraventricular tachycardia) ([place])   2. 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 Atypical chest pain   4. Hypercholesteremia    PLAN:    In order of problems listed above:  1. Supraventricular tachycardia.  Asymptomatic, does not want to do anything about it.  He does have appoint with the EP team on Monday however he wanted to cancel it he said he does not think he needed.  We will respect his wishes an appointment will be canceled.  Also told him if he was still having some issue with his heart he need to let me know immediately. 2. Diffuse large B-cell lymphoma.  Treated by oncology with chemotherapy. 3. Atypical chest pain could be related to his lymphoma.  Cardiac work-up has been negative. 4. Dyslipidemia: He is on Crestor 5 which I will continue.  He did have fasting lipid profile I see did not K PN from 10/09/2019 showing LDL of 89 and HDL 51.  I did also review his  echocardiogram showing frequent ventricular ectopy, ejection fraction was 45-50 and I discussed this with him and again he does not want to do anything about that.  I see him back in about 5 months and also told him if he develop any symptoms he need to let me know.   Medication Adjustments/Labs and Tests Ordered: Current medicines are reviewed at length with the patient today.  Concerns regarding medicines are outlined above.  Orders Placed This Encounter  Procedures  . EKG 12-Lead  Medication changes: No orders of the defined types were placed in this encounter.   Signed, Park Liter, MD, [facility] 12/21/2019 1:34 PM    [place]

## 2019-12-21 NOTE — Patient Instructions (Signed)

## 2019-12-24 ENCOUNTER — Ambulatory Visit: Payer: Medicare Other | Admitting: Cardiology

## 2019-12-31 DIAGNOSIS — C8331 Diffuse large B-cell lymphoma, lymph nodes of head, face, and neck: Secondary | ICD-10-CM | POA: Diagnosis not present

## 2019-12-31 DIAGNOSIS — E860 Dehydration: Secondary | ICD-10-CM | POA: Diagnosis not present

## 2019-12-31 DIAGNOSIS — D630 Anemia in neoplastic disease: Secondary | ICD-10-CM | POA: Diagnosis not present

## 2019-12-31 DIAGNOSIS — D6481 Anemia due to antineoplastic chemotherapy: Secondary | ICD-10-CM | POA: Diagnosis not present

## 2020-01-02 DIAGNOSIS — C8331 Diffuse large B-cell lymphoma, lymph nodes of head, face, and neck: Secondary | ICD-10-CM | POA: Diagnosis not present

## 2020-01-02 DIAGNOSIS — Z5189 Encounter for other specified aftercare: Secondary | ICD-10-CM | POA: Diagnosis not present

## 2020-01-21 DIAGNOSIS — I700 Atherosclerosis of aorta: Secondary | ICD-10-CM | POA: Diagnosis not present

## 2020-01-21 DIAGNOSIS — J9811 Atelectasis: Secondary | ICD-10-CM | POA: Diagnosis not present

## 2020-01-21 DIAGNOSIS — C8331 Diffuse large B-cell lymphoma, lymph nodes of head, face, and neck: Secondary | ICD-10-CM | POA: Diagnosis not present

## 2020-01-21 DIAGNOSIS — K5730 Diverticulosis of large intestine without perforation or abscess without bleeding: Secondary | ICD-10-CM | POA: Diagnosis not present

## 2020-01-21 DIAGNOSIS — K449 Diaphragmatic hernia without obstruction or gangrene: Secondary | ICD-10-CM | POA: Diagnosis not present

## 2020-01-23 DIAGNOSIS — D6481 Anemia due to antineoplastic chemotherapy: Secondary | ICD-10-CM | POA: Diagnosis not present

## 2020-01-23 DIAGNOSIS — C8331 Diffuse large B-cell lymphoma, lymph nodes of head, face, and neck: Secondary | ICD-10-CM | POA: Diagnosis not present

## 2020-01-28 DIAGNOSIS — C8338 Diffuse large B-cell lymphoma, lymph nodes of multiple sites: Secondary | ICD-10-CM | POA: Diagnosis not present

## 2020-01-28 DIAGNOSIS — C8381 Other non-follicular lymphoma, lymph nodes of head, face, and neck: Secondary | ICD-10-CM | POA: Diagnosis not present

## 2020-01-29 DIAGNOSIS — Z510 Encounter for antineoplastic radiation therapy: Secondary | ICD-10-CM | POA: Diagnosis not present

## 2020-01-29 DIAGNOSIS — C8331 Diffuse large B-cell lymphoma, lymph nodes of head, face, and neck: Secondary | ICD-10-CM | POA: Diagnosis not present

## 2020-01-31 DIAGNOSIS — C8331 Diffuse large B-cell lymphoma, lymph nodes of head, face, and neck: Secondary | ICD-10-CM | POA: Diagnosis not present

## 2020-02-02 DIAGNOSIS — R1110 Vomiting, unspecified: Secondary | ICD-10-CM | POA: Diagnosis not present

## 2020-02-02 DIAGNOSIS — R319 Hematuria, unspecified: Secondary | ICD-10-CM | POA: Diagnosis not present

## 2020-02-02 DIAGNOSIS — R1084 Generalized abdominal pain: Secondary | ICD-10-CM | POA: Diagnosis not present

## 2020-02-02 DIAGNOSIS — R1012 Left upper quadrant pain: Secondary | ICD-10-CM | POA: Diagnosis not present

## 2020-02-02 DIAGNOSIS — I4891 Unspecified atrial fibrillation: Secondary | ICD-10-CM | POA: Diagnosis not present

## 2020-02-02 DIAGNOSIS — N3001 Acute cystitis with hematuria: Secondary | ICD-10-CM | POA: Diagnosis not present

## 2020-02-07 DIAGNOSIS — Z6826 Body mass index (BMI) 26.0-26.9, adult: Secondary | ICD-10-CM | POA: Diagnosis not present

## 2020-02-07 DIAGNOSIS — D72829 Elevated white blood cell count, unspecified: Secondary | ICD-10-CM | POA: Diagnosis not present

## 2020-02-07 DIAGNOSIS — K5730 Diverticulosis of large intestine without perforation or abscess without bleeding: Secondary | ICD-10-CM | POA: Diagnosis not present

## 2020-02-07 DIAGNOSIS — I700 Atherosclerosis of aorta: Secondary | ICD-10-CM | POA: Diagnosis not present

## 2020-02-07 DIAGNOSIS — K859 Acute pancreatitis without necrosis or infection, unspecified: Secondary | ICD-10-CM | POA: Diagnosis not present

## 2020-02-07 DIAGNOSIS — N200 Calculus of kidney: Secondary | ICD-10-CM | POA: Diagnosis not present

## 2020-02-07 DIAGNOSIS — I2510 Atherosclerotic heart disease of native coronary artery without angina pectoris: Secondary | ICD-10-CM | POA: Diagnosis not present

## 2020-02-07 DIAGNOSIS — K449 Diaphragmatic hernia without obstruction or gangrene: Secondary | ICD-10-CM | POA: Diagnosis not present

## 2020-02-07 DIAGNOSIS — Z8572 Personal history of non-Hodgkin lymphomas: Secondary | ICD-10-CM | POA: Diagnosis not present

## 2020-02-07 DIAGNOSIS — M4854XA Collapsed vertebra, not elsewhere classified, thoracic region, initial encounter for fracture: Secondary | ICD-10-CM | POA: Diagnosis not present

## 2020-02-07 DIAGNOSIS — R1013 Epigastric pain: Secondary | ICD-10-CM | POA: Diagnosis not present

## 2020-02-08 DIAGNOSIS — Z510 Encounter for antineoplastic radiation therapy: Secondary | ICD-10-CM | POA: Diagnosis not present

## 2020-02-08 DIAGNOSIS — C8331 Diffuse large B-cell lymphoma, lymph nodes of head, face, and neck: Secondary | ICD-10-CM | POA: Diagnosis not present

## 2020-02-11 DIAGNOSIS — C8331 Diffuse large B-cell lymphoma, lymph nodes of head, face, and neck: Secondary | ICD-10-CM | POA: Diagnosis not present

## 2020-02-11 DIAGNOSIS — Z510 Encounter for antineoplastic radiation therapy: Secondary | ICD-10-CM | POA: Diagnosis not present

## 2020-02-12 DIAGNOSIS — K859 Acute pancreatitis without necrosis or infection, unspecified: Secondary | ICD-10-CM | POA: Diagnosis not present

## 2020-02-13 DIAGNOSIS — R1084 Generalized abdominal pain: Secondary | ICD-10-CM | POA: Diagnosis not present

## 2020-02-13 DIAGNOSIS — K859 Acute pancreatitis without necrosis or infection, unspecified: Secondary | ICD-10-CM | POA: Diagnosis not present

## 2020-02-14 DIAGNOSIS — Z510 Encounter for antineoplastic radiation therapy: Secondary | ICD-10-CM | POA: Diagnosis not present

## 2020-02-14 DIAGNOSIS — C8331 Diffuse large B-cell lymphoma, lymph nodes of head, face, and neck: Secondary | ICD-10-CM | POA: Diagnosis not present

## 2020-02-15 DIAGNOSIS — C8331 Diffuse large B-cell lymphoma, lymph nodes of head, face, and neck: Secondary | ICD-10-CM | POA: Diagnosis not present

## 2020-02-15 DIAGNOSIS — K859 Acute pancreatitis without necrosis or infection, unspecified: Secondary | ICD-10-CM | POA: Diagnosis not present

## 2020-02-15 DIAGNOSIS — Z510 Encounter for antineoplastic radiation therapy: Secondary | ICD-10-CM | POA: Diagnosis not present

## 2020-02-18 DIAGNOSIS — Z510 Encounter for antineoplastic radiation therapy: Secondary | ICD-10-CM | POA: Diagnosis not present

## 2020-02-18 DIAGNOSIS — C8331 Diffuse large B-cell lymphoma, lymph nodes of head, face, and neck: Secondary | ICD-10-CM | POA: Diagnosis not present

## 2020-02-19 DIAGNOSIS — Z510 Encounter for antineoplastic radiation therapy: Secondary | ICD-10-CM | POA: Diagnosis not present

## 2020-02-19 DIAGNOSIS — C8331 Diffuse large B-cell lymphoma, lymph nodes of head, face, and neck: Secondary | ICD-10-CM | POA: Diagnosis not present

## 2020-02-20 DIAGNOSIS — C8331 Diffuse large B-cell lymphoma, lymph nodes of head, face, and neck: Secondary | ICD-10-CM | POA: Diagnosis not present

## 2020-02-20 DIAGNOSIS — Z510 Encounter for antineoplastic radiation therapy: Secondary | ICD-10-CM | POA: Diagnosis not present

## 2020-02-21 DIAGNOSIS — C8331 Diffuse large B-cell lymphoma, lymph nodes of head, face, and neck: Secondary | ICD-10-CM | POA: Diagnosis not present

## 2020-02-21 DIAGNOSIS — Z510 Encounter for antineoplastic radiation therapy: Secondary | ICD-10-CM | POA: Diagnosis not present

## 2020-02-22 DIAGNOSIS — Z510 Encounter for antineoplastic radiation therapy: Secondary | ICD-10-CM | POA: Diagnosis not present

## 2020-02-22 DIAGNOSIS — C8331 Diffuse large B-cell lymphoma, lymph nodes of head, face, and neck: Secondary | ICD-10-CM | POA: Diagnosis not present

## 2020-02-25 DIAGNOSIS — Z510 Encounter for antineoplastic radiation therapy: Secondary | ICD-10-CM | POA: Diagnosis not present

## 2020-02-25 DIAGNOSIS — C8331 Diffuse large B-cell lymphoma, lymph nodes of head, face, and neck: Secondary | ICD-10-CM | POA: Diagnosis not present

## 2020-02-26 DIAGNOSIS — Z23 Encounter for immunization: Secondary | ICD-10-CM | POA: Diagnosis not present

## 2020-02-26 DIAGNOSIS — Z510 Encounter for antineoplastic radiation therapy: Secondary | ICD-10-CM | POA: Diagnosis not present

## 2020-02-26 DIAGNOSIS — C8331 Diffuse large B-cell lymphoma, lymph nodes of head, face, and neck: Secondary | ICD-10-CM | POA: Diagnosis not present

## 2020-02-27 DIAGNOSIS — Z510 Encounter for antineoplastic radiation therapy: Secondary | ICD-10-CM | POA: Diagnosis not present

## 2020-02-27 DIAGNOSIS — C8331 Diffuse large B-cell lymphoma, lymph nodes of head, face, and neck: Secondary | ICD-10-CM | POA: Diagnosis not present

## 2020-02-28 DIAGNOSIS — C8331 Diffuse large B-cell lymphoma, lymph nodes of head, face, and neck: Secondary | ICD-10-CM | POA: Diagnosis not present

## 2020-02-28 DIAGNOSIS — Z510 Encounter for antineoplastic radiation therapy: Secondary | ICD-10-CM | POA: Diagnosis not present

## 2020-02-29 DIAGNOSIS — Z510 Encounter for antineoplastic radiation therapy: Secondary | ICD-10-CM | POA: Diagnosis not present

## 2020-02-29 DIAGNOSIS — C8331 Diffuse large B-cell lymphoma, lymph nodes of head, face, and neck: Secondary | ICD-10-CM | POA: Diagnosis not present

## 2020-03-03 DIAGNOSIS — Z510 Encounter for antineoplastic radiation therapy: Secondary | ICD-10-CM | POA: Diagnosis not present

## 2020-03-03 DIAGNOSIS — C8331 Diffuse large B-cell lymphoma, lymph nodes of head, face, and neck: Secondary | ICD-10-CM | POA: Diagnosis not present

## 2020-03-04 DIAGNOSIS — Z510 Encounter for antineoplastic radiation therapy: Secondary | ICD-10-CM | POA: Diagnosis not present

## 2020-03-04 DIAGNOSIS — C8331 Diffuse large B-cell lymphoma, lymph nodes of head, face, and neck: Secondary | ICD-10-CM | POA: Diagnosis not present

## 2020-03-05 DIAGNOSIS — C8331 Diffuse large B-cell lymphoma, lymph nodes of head, face, and neck: Secondary | ICD-10-CM | POA: Diagnosis not present

## 2020-03-05 DIAGNOSIS — Z510 Encounter for antineoplastic radiation therapy: Secondary | ICD-10-CM | POA: Diagnosis not present

## 2020-03-06 DIAGNOSIS — Z510 Encounter for antineoplastic radiation therapy: Secondary | ICD-10-CM | POA: Diagnosis not present

## 2020-03-06 DIAGNOSIS — C8331 Diffuse large B-cell lymphoma, lymph nodes of head, face, and neck: Secondary | ICD-10-CM | POA: Diagnosis not present

## 2020-03-06 DIAGNOSIS — K859 Acute pancreatitis without necrosis or infection, unspecified: Secondary | ICD-10-CM | POA: Diagnosis not present

## 2020-03-07 DIAGNOSIS — C8331 Diffuse large B-cell lymphoma, lymph nodes of head, face, and neck: Secondary | ICD-10-CM | POA: Diagnosis not present

## 2020-03-07 DIAGNOSIS — L821 Other seborrheic keratosis: Secondary | ICD-10-CM | POA: Diagnosis not present

## 2020-03-07 DIAGNOSIS — L570 Actinic keratosis: Secondary | ICD-10-CM | POA: Diagnosis not present

## 2020-03-07 DIAGNOSIS — Z510 Encounter for antineoplastic radiation therapy: Secondary | ICD-10-CM | POA: Diagnosis not present

## 2020-03-10 DIAGNOSIS — C8331 Diffuse large B-cell lymphoma, lymph nodes of head, face, and neck: Secondary | ICD-10-CM | POA: Diagnosis not present

## 2020-03-10 DIAGNOSIS — Z510 Encounter for antineoplastic radiation therapy: Secondary | ICD-10-CM | POA: Diagnosis not present

## 2020-03-11 DIAGNOSIS — Z510 Encounter for antineoplastic radiation therapy: Secondary | ICD-10-CM | POA: Diagnosis not present

## 2020-03-11 DIAGNOSIS — C8331 Diffuse large B-cell lymphoma, lymph nodes of head, face, and neck: Secondary | ICD-10-CM | POA: Diagnosis not present

## 2020-03-12 ENCOUNTER — Other Ambulatory Visit: Payer: Self-pay | Admitting: Oncology

## 2020-03-12 ENCOUNTER — Telehealth: Payer: Self-pay | Admitting: Oncology

## 2020-03-12 DIAGNOSIS — C8331 Diffuse large B-cell lymphoma, lymph nodes of head, face, and neck: Secondary | ICD-10-CM

## 2020-03-12 NOTE — Telephone Encounter (Signed)
Patient notified of added Lab, F/U on 12/1 after Dr Palermo's Appt at 2 pm  [facility] per Patient

## 2020-03-13 DIAGNOSIS — Z510 Encounter for antineoplastic radiation therapy: Secondary | ICD-10-CM | POA: Diagnosis not present

## 2020-03-13 DIAGNOSIS — C8331 Diffuse large B-cell lymphoma, lymph nodes of head, face, and neck: Secondary | ICD-10-CM | POA: Diagnosis not present

## 2020-03-18 DIAGNOSIS — Z8572 Personal history of non-Hodgkin lymphomas: Secondary | ICD-10-CM | POA: Diagnosis not present

## 2020-03-18 DIAGNOSIS — R238 Other skin changes: Secondary | ICD-10-CM | POA: Diagnosis not present

## 2020-03-18 DIAGNOSIS — Z6825 Body mass index (BMI) 25.0-25.9, adult: Secondary | ICD-10-CM | POA: Diagnosis not present

## 2020-04-07 NOTE — Progress Notes (Signed)
[facility]  [address] [place],  [place]  [address] [phone]  Clinic Day:  04/09/2020  Referring physician: Angelina Sheriff, MD   This document serves as a record of services personally performed by Hosie Poisson, MD. It was created on their behalf by [facility], a trained medical scribe. The creation of this record is based on the scribe's personal observations and the provider's statements to them.   CHIEF COMPLAINT:  CC: Stage IIA large B-cell lymphoma  Current Treatment:  Surveillance   HISTORY OF PRESENT ILLNESS:  Ryan Ball is a 86 y.o. male with stage IIA diffuse large B-cell lymphoma diagnosed in June 2021.  He presented with voice changes and dysphagia.  CT imaging revealed 2.4 x 3.9 x 4.8 cm posterior tongue base mass, as well as mild bilateral cervical lymphadenopathy.  He saw Dr. Conley Canal and underwent biopsy, which revealed diffuse large B-cell lymphoma, non-germinal center phenotype, which was CD45, CD19, CD20 and CD38 positive.  PET-CT revealed hypermetabolic tongue base mass measuring 4.8 x 2.1 cm with an SUV of 15.5 with bilateral cervical lymph nodes measuring 1.5 cm with an SUV max of 11.3.  He was seen by Dr. Cassell Clement at [facility].  She recommended starting with R-mini-CHOP given every 3 weeks, increasing the doses of chemotherapy with each cycle as tolerated.  Prior to chemotherapy he had an echocardiogram, which revealed mild concentric left ventricular hypertrophy, with mild global hypokinesis of the left ventricle and overall mildly impaired left ventricular systolic function with an ejection fraction of 45-50%.  He had his 1st cycle of R-mini-CHOP, with the chemotherapy given at 50% dose reduction, on July 12th. He received his second cycle at 75% dose and a third cycle at 90%.    PET imaging from September 13th revealed no evidence of metabolically active lymphoma within the neck, chest,  abdomen or pelvis.  The previously demonstrated mass at the base of the tongue has resolved.  He was referred for involved field radiation for consolidation.     INTERVAL HISTORY:  Ryan Ball is here for routine follow up after completing adjuvant involved field radiation on November 4th.  He states that he has been doing very well and remains active.  He denies further dysphagia.  However, he states that he had an episode of pancreatitis about 6 weeks ago, and so has been on a specific diet since that time.  He has recently gone back to his normal consumption.  His hemoglobin is 12.4 and fairly stable, and his white count and platelets are normal.  Chemistries are unremarkable.  His  appetite is good, and his weight is stable since his last visit.  He denies fever, chills or other signs of infection.  He denies nausea, vomiting, bowel issues, or abdominal pain.  He denies sore throat, cough, dyspnea, or chest pain.   REVIEW OF SYSTEMS:  Review of Systems  Constitutional: Positive for appetite change (been on a diet for the past 6 weeks due to episode of pancreatitis).  HENT:  Negative.   Eyes: Negative.   Respiratory: Negative.   Cardiovascular: Negative.   Gastrointestinal: Negative.   Endocrine: Negative.   Genitourinary: Negative.    Musculoskeletal: Negative.   Skin: Negative.   Neurological: Negative.   Hematological: Negative.   Psychiatric/Behavioral: Negative.   All other systems reviewed and are negative.    VITALS:  Blood pressure (!) 155/67, pulse 84, temperature 98.3 F (36.8 C), temperature source Oral,  resp. rate 20, height 5' 9"  (1.753 m), weight 166 lb 12.8 oz (75.7 kg), SpO2 98 %.  Wt Readings from Last 3 Encounters:  04/09/20 166 lb 12.8 oz (75.7 kg)  12/21/19 178 lb (80.7 kg)  09/20/19 181 lb (82.1 kg)    Body mass index is 24.63 kg/m.  Performance status (ECOG): 1 - Symptomatic but completely ambulatory  PHYSICAL EXAM:  Physical Exam Constitutional:       General: He is not in acute distress.    Appearance: Normal appearance. He is normal weight.  HENT:     Head: Normocephalic and atraumatic.     Mouth/Throat:     Mouth: Mucous membranes are moist.     Pharynx: Posterior oropharyngeal erythema (very mild) present.  Eyes:     General: No scleral icterus.    Extraocular Movements: Extraocular movements intact.     Conjunctiva/sclera: Conjunctivae normal.     Pupils: Pupils are equal, round, and reactive to light.  Cardiovascular:     Rate and Rhythm: Normal rate and regular rhythm.     Pulses: Normal pulses.     Heart sounds: Normal heart sounds. No murmur heard.  No friction rub. No gallop.   Pulmonary:     Effort: Pulmonary effort is normal. No respiratory distress.     Breath sounds: Normal breath sounds.  Abdominal:     General: Bowel sounds are normal. There is no distension.     Palpations: Abdomen is soft. There is no mass.     Tenderness: There is no abdominal tenderness.  Musculoskeletal:        General: Normal range of motion.     Cervical back: Normal range of motion and neck supple.     Right lower leg: No edema.     Left lower leg: No edema.  Lymphadenopathy:     Cervical: No cervical adenopathy.  Skin:    General: Skin is warm and dry.  Neurological:     General: No focal deficit present.     Mental Status: He is alert and oriented to person, place, and time. Mental status is at baseline.  Psychiatric:        Mood and Affect: Mood normal.        Behavior: Behavior normal.        Thought Content: Thought content normal.        Judgment: Judgment normal.     LABS:   CBC Latest Ref Rng & Units 04/09/2020  WBC - 5.5  Hemoglobin 13.5 - 17.5 12.4(A)  Hematocrit 41 - 53 38(A)  Platelets 150 - 399 181   CMP Latest Ref Rng & Units 04/09/2020  BUN 4 - 21 12  Creatinine 0.6 - 1.3 0.9  Sodium 137 - 147 139  Potassium 3.4 - 5.3 4.1  Chloride 99 - 108 101  CO2 13 - 22 28(A)  Calcium 8.7 - 10.7 8.9  Alkaline Phos  25 - 125 55  AST 14 - 40 32  ALT 10 - 40 24     No results found for: LDH   STUDIES:  No results found.   Allergies: No Known Allergies  Current Medications: Current Outpatient Medications  Medication Sig Dispense Refill  . Cholecalciferol (REPLESTA) 1.25 MG (50000 UT) WAFR Take 1 Wafer by mouth every 14 (fourteen) days.    . Cyanocobalamin 1000 MCG/ML KIT Inject 1,000 mcg as directed every 30 (thirty) days. Every 4 weeks    . doxepin (SINEQUAN) 25 MG capsule Take 1  capsule by mouth at bedtime as needed.    Marland Kitchen esomeprazole (NEXIUM) 40 MG capsule Take 40 mg by mouth daily.    Marland Kitchen loratadine (CLARITIN) 10 MG tablet Take 10 mg by mouth daily.    . rosuvastatin (CRESTOR) 5 MG tablet Take 1 tablet by mouth every other day.      No current facility-administered medications for this visit.     ASSESSMENT & PLAN:   Assessment:   1. Stage II diffuse large B-cell lymphoma.  He tolerated treatment with R-mini-CHOP without significant difficulty, and completed 3 cycles at the end of August.  PET imaging from September has revealed remission with no evidence of metabolically active lymphoma.  The mass at the tongue base has resolved.  He has completed consolidation radiation.    2. Mildly impaired left ventricular systolic function with an EF between 45-50%. He continues to follow up with Dr. Agustin Cree, his cardiologist.   3. Anemia, stable, likely secondary to disease and chemotherapy.  We will continue to monitor this and it should gradually improve.  Plan: He completed three cycles of R-miniCHOP at the end of August.  PET imaging has revealed no evidence of metabolically active lymphoma within the neck, chest, abdomen or pelvis.  The previously demonstrated mass at the base of the tongue has resolved.  He completed adjuvant involved field radiation on November 4th.  We will schedule him for port flush next week.  We will see him back in 3 months with CBC, CMP, LDH, and PET skull base to  thigh for reassessment. The patient understands the plans discussed today and is in agreement with them.  He knows to contact our office if he develops concerns prior to his next appointment.   I provided 15 minutes of face-to-face time during this this encounter and > 50% was spent counseling as documented under my assessment and plan.    [name], MD [facility] AT [facility] [address] [place] [address] Dept: [phone] Dept Fax: [phone]   I, Rita Ohara, am acting as scribe for [name], MD  I have reviewed this report as typed by the medical scribe, and it is complete and accurate.

## 2020-04-09 ENCOUNTER — Inpatient Hospital Stay: Payer: Medicare Other | Attending: Oncology

## 2020-04-09 ENCOUNTER — Other Ambulatory Visit: Payer: Self-pay

## 2020-04-09 ENCOUNTER — Other Ambulatory Visit: Payer: Self-pay | Admitting: Pharmacist

## 2020-04-09 ENCOUNTER — Inpatient Hospital Stay (INDEPENDENT_AMBULATORY_CARE_PROVIDER_SITE_OTHER): Payer: Medicare Other | Admitting: Oncology

## 2020-04-09 ENCOUNTER — Telehealth: Payer: Self-pay | Admitting: Oncology

## 2020-04-09 ENCOUNTER — Other Ambulatory Visit: Payer: Self-pay | Admitting: Hematology and Oncology

## 2020-04-09 VITALS — BP 155/67 | HR 84 | Temp 98.3°F | Resp 20 | Ht 69.0 in | Wt 166.8 lb

## 2020-04-09 DIAGNOSIS — Z8572 Personal history of non-Hodgkin lymphomas: Secondary | ICD-10-CM | POA: Insufficient documentation

## 2020-04-09 DIAGNOSIS — Z452 Encounter for adjustment and management of vascular access device: Secondary | ICD-10-CM | POA: Insufficient documentation

## 2020-04-09 DIAGNOSIS — D6481 Anemia due to antineoplastic chemotherapy: Secondary | ICD-10-CM | POA: Diagnosis not present

## 2020-04-09 DIAGNOSIS — C8331 Diffuse large B-cell lymphoma, lymph nodes of head, face, and neck: Secondary | ICD-10-CM

## 2020-04-09 DIAGNOSIS — T451X5A Adverse effect of antineoplastic and immunosuppressive drugs, initial encounter: Secondary | ICD-10-CM

## 2020-04-09 HISTORY — DX: Encounter for adjustment and management of vascular access device: Z45.2

## 2020-04-09 LAB — BASIC METABOLIC PANEL
BUN: 12 (ref 4–21)
CO2: 28 — AB (ref 13–22)
Chloride: 101 (ref 99–108)
Creatinine: 0.9 (ref 0.6–1.3)
Glucose: 114
Potassium: 4.1 (ref 3.4–5.3)
Sodium: 139 (ref 137–147)

## 2020-04-09 LAB — HEPATIC FUNCTION PANEL
ALT: 24 (ref 10–40)
AST: 32 (ref 14–40)
Alkaline Phosphatase: 55 (ref 25–125)
Bilirubin, Total: 0.3

## 2020-04-09 LAB — CBC AND DIFFERENTIAL
HCT: 38 — AB (ref 41–53)
Hemoglobin: 12.4 — AB (ref 13.5–17.5)
Neutrophils Absolute: 3.85
Platelets: 181 (ref 150–399)
WBC: 5.5

## 2020-04-09 LAB — COMPREHENSIVE METABOLIC PANEL
Albumin: 3.5 (ref 3.5–5.0)
Calcium: 8.9 (ref 8.7–10.7)

## 2020-04-09 LAB — LACTATE DEHYDROGENASE: LDH: 113 U/L (ref 98–192)

## 2020-04-09 LAB — CBC: RBC: 4.32 (ref 3.87–5.11)

## 2020-04-09 NOTE — Telephone Encounter (Signed)
Per 12/1 LOS, Patient scheduled for 07/09/2020 Labs, Follow up with Dr Hinton Rao.  Will scheduled PET Scan when we received the schedule on PET Scan's for next year.  Patient notified

## 2020-04-09 NOTE — Telephone Encounter (Signed)
Per Ander Purpura, schedule patient for [facility] next week.  Scheduled patient for 12/7 at 10:00 am - Gave patient Appt Calendar

## 2020-04-13 DIAGNOSIS — D6481 Anemia due to antineoplastic chemotherapy: Secondary | ICD-10-CM | POA: Insufficient documentation

## 2020-04-13 DIAGNOSIS — T451X5A Adverse effect of antineoplastic and immunosuppressive drugs, initial encounter: Secondary | ICD-10-CM

## 2020-04-13 HISTORY — DX: Anemia due to antineoplastic chemotherapy: T45.1X5A

## 2020-04-13 HISTORY — DX: Anemia due to antineoplastic chemotherapy: D64.81

## 2020-04-15 ENCOUNTER — Inpatient Hospital Stay: Payer: Medicare Other

## 2020-04-15 ENCOUNTER — Other Ambulatory Visit: Payer: Self-pay

## 2020-04-15 VITALS — BP 131/80 | HR 71 | Temp 97.6°F | Resp 20 | Ht 69.0 in | Wt 167.8 lb

## 2020-04-15 DIAGNOSIS — Z8572 Personal history of non-Hodgkin lymphomas: Secondary | ICD-10-CM | POA: Diagnosis not present

## 2020-04-15 DIAGNOSIS — Z452 Encounter for adjustment and management of vascular access device: Secondary | ICD-10-CM | POA: Diagnosis not present

## 2020-04-15 MED ORDER — SODIUM CHLORIDE 0.9% FLUSH
10.0000 mL | INTRAVENOUS | Status: DC | PRN
  Administered 2020-04-15: 10 mL
  Filled 2020-04-15: qty 10

## 2020-04-15 MED ORDER — HEPARIN SOD (PORK) LOCK FLUSH 100 UNIT/ML IV SOLN
500.0000 [IU] | Freq: Once | INTRAVENOUS | Status: AC | PRN
  Administered 2020-04-15: 500 [IU]
  Filled 2020-04-15: qty 5

## 2020-04-15 NOTE — Progress Notes (Signed)
PT STABLE AT TIME OF DISCHARGE 

## 2020-05-20 ENCOUNTER — Other Ambulatory Visit: Payer: Self-pay

## 2020-05-20 ENCOUNTER — Encounter: Payer: Self-pay | Admitting: Cardiology

## 2020-05-20 ENCOUNTER — Ambulatory Visit (INDEPENDENT_AMBULATORY_CARE_PROVIDER_SITE_OTHER): Payer: Medicare Other | Admitting: Cardiology

## 2020-05-20 VITALS — BP 136/76 | HR 76 | Ht 69.0 in | Wt 166.0 lb

## 2020-05-20 DIAGNOSIS — C8331 Diffuse large B-cell lymphoma, lymph nodes of head, face, and neck: Secondary | ICD-10-CM | POA: Diagnosis not present

## 2020-05-20 DIAGNOSIS — I471 Supraventricular tachycardia: Secondary | ICD-10-CM

## 2020-05-20 DIAGNOSIS — I420 Dilated cardiomyopathy: Secondary | ICD-10-CM

## 2020-05-20 HISTORY — DX: Dilated cardiomyopathy: I42.0

## 2020-05-20 NOTE — Progress Notes (Signed)
Cardiology Office Note:    Date:  05/20/2020   ID:  Ryan Ball, DOB 10/10/1930, MRN [id number]  PCP:  Angelina Sheriff, MD  Cardiologist:  Jenne Campus, MD    Referring MD: Angelina Sheriff, MD   Chief Complaint  Patient presents with  . Follow-up  Doing great  History of Present Illness:    Ryan Ball is a 87 y.o. male with past medical history significant for essential hypertension, dyslipidemia, atypical chest pain.  After that he got quite extensive evaluation done which included stress testing which was negative for exercise-induced myocardial ischemia as well as echocardiogram which showed preserved left ventricle ejection fraction.  However he was fine to have narrow complex tachycardia I brought him to me and talk about this I offered him medication however he does not want to take it he said he feels great and he does not want to take any medications.  Also he was found to have B-cell lymphoma involving the base of his tongue.  Chemotherapy has been initiated with complete resolution of this problem.  He is scheduled to have another PET scan to make sure there is no activity anymore.  He tells me today that he feels great he said he is able to walk and have no difficulty doing it. I did review note by oncologist which mention he is ejection fraction 45% and I did review echocardiogram from the hospital done in the summer showing that.  Truly he does have diminished left ventricle ejection fraction.  He does not have shortness of breath swelling of lower extremities paroxysmal atrial dyspnea.  We did talk at length about this and I told him that there are some medication can be used to help with this and he told me straight he does not want to take any medication for it he said he feels great and he does not want to mess it up.  I wanted him also to repeat echocardiogram to recheck on the ventricle ejection fraction he does not want to do that.  Past  Medical History:  Diagnosis Date  . Anemia due to antineoplastic chemotherapy 04/13/2020  . Atypical chest pain 10/10/2018  . Cardiomegaly 06/08/2016  . Diffuse large B-cell lymphoma ([place]) 10/30/2019  . Encounter for venous access device care 04/09/2020  . Esophageal reflux 10/10/2018  . Hypercholesteremia 10/10/2018  . Postoperative examination 12/12/2019  . Senile nuclear sclerosis 05/19/2012  . Senile nuclear sclerosis, bilateral 11/02/2019  . Sleep disturbance 10/10/2018  . SVT (supraventricular tachycardia) ([place]) 09/20/2019    Past Surgical History:  Procedure Laterality Date  . CHOLECYSTECTOMY      Current Medications: Current Meds  Medication Sig  . Cholecalciferol 1.25 MG (50000 UT) WAFR Take 1 Wafer by mouth every 14 (fourteen) days.  . Cyanocobalamin 1000 MCG/ML KIT Inject 1,000 mcg as directed every 30 (thirty) days. Every 4 weeks  . doxepin (SINEQUAN) 25 MG capsule Take 1 capsule by mouth at bedtime as needed.  . rosuvastatin (CRESTOR) 5 MG tablet Take 1 tablet by mouth every other day.      Allergies:   Patient has no known allergies.   Social History   Socioeconomic History  . Marital status: Widowed    Spouse name: Not on file  . Number of children: Not on file  . Years of education: Not on file  . Highest education level: Not on file  Occupational History  . Not on file  Tobacco Use  . Smoking status:  Never Smoker  . Smokeless tobacco: Never Used  Substance and Sexual Activity  . Alcohol use: Yes    Alcohol/week: 6.0 standard drinks    Types: 6 Glasses of wine per week    Comment: 1-2 glasses of wine 2-3 times a week  . Drug use: Never  . Sexual activity: Not on file  Other Topics Concern  . Not on file  Social History Narrative  . Not on file   Social Determinants of Health   Financial Resource Strain: Not on file  Food Insecurity: Not on file  Transportation Needs: Not on file  Physical Activity: Not on file  Stress: Not on file  Social Connections:  Not on file     Family History: The patient's family history includes CAD in his brother and father. ROS:   Please see the history of present illness.    All 14 point review of systems negative except as described per history of present illness  EKGs/Labs/Other Studies Reviewed:      Recent Labs: 04/09/2020: ALT 24; BUN 12; Creatinine 0.9; Hemoglobin 12.4; Platelets 181; Potassium 4.1; Sodium 139  Recent Lipid Panel No results found for: CHOL, TRIG, HDL, CHOLHDL, VLDL, LDLCALC, LDLDIRECT  Physical Exam:    VS:  BP 136/76 (BP Location: Right Arm, Patient Position: Sitting)   Pulse 76   Ht 5' 9"  (1.753 m)   Wt 166 lb (75.3 kg)   SpO2 98%   BMI 24.51 kg/m     Wt Readings from Last 3 Encounters:  05/20/20 166 lb (75.3 kg)  04/15/20 167 lb 12 oz (76.1 kg)  04/09/20 166 lb 12.8 oz (75.7 kg)     GEN:  Well nourished, well developed in no acute distress HEENT: Normal NECK: No JVD; No carotid bruits LYMPHATICS: No lymphadenopathy CARDIAC: RRR, no murmurs, no rubs, no gallops RESPIRATORY:  Clear to auscultation without rales, wheezing or rhonchi  ABDOMEN: Soft, non-tender, non-distended MUSCULOSKELETAL:  No edema; No deformity  SKIN: Warm and dry LOWER EXTREMITIES: no swelling NEUROLOGIC:  Alert and oriented x 3 PSYCHIATRIC:  Normal affect   ASSESSMENT:    1. SVT (supraventricular tachycardia) ([place])   2. Dilated cardiomyopathy ([place]) ejection fraction 45% in summer 2021 by echo   3. Diffuse large B-cell lymphoma of lymph nodes of head ([facility])    PLAN:    In order of problems listed above:  1. Supraventricular tachycardia asymptomatic does not want to take any medication for it. 2. Dilated cardiomyopathy with latest estimation of left ventricle ejection fraction from December 2021 showing ejection fraction 45%.  I offered him some medication he does not want to take it because he feels great and he does not want to mess it up. 3. Diffuse large B-cell lymphoma of lymph  nodes of the head as well as the base of the tongue treated excellently localized by oncology team.  Almost complete resolution based on recent PET scan, he is scheduled to have another PET scan. 4. Overall after me he is doing amazingly well and I do not blame him for the fact that he does not want to take any medication that may make him feel better.  We will continue monitoring.   Medication Adjustments/Labs and Tests Ordered: Current medicines are reviewed at length with the patient today.  Concerns regarding medicines are outlined above.  No orders of the defined types were placed in this encounter.  Medication changes: No orders of the defined types were placed in this encounter.   Signed,  Park Liter, MD, [facility] 05/20/2020 10:29 AM    [place]

## 2020-05-20 NOTE — Patient Instructions (Signed)
Medication Instructions:  °Your physician recommends that you continue on your current medications as directed. Please refer to the Current Medication list given to you today. ° °*If you need a refill on your cardiac medications before your next appointment, please call your pharmacy* ° ° °Lab Work: °NONE °If you have labs (blood work) drawn today and your tests are completely normal, you will receive your results only by: °• MyChart Message (if you have MyChart) OR °• A paper copy in the mail °If you have any lab test that is abnormal or we need to change your treatment, we will call you to review the results. ° ° °Testing/Procedures: °NONE ° ° °Follow-Up: °At [facility], you and your health needs are our priority.  As part of our continuing mission to provide you with exceptional heart care, we have created designated Provider Care Teams.  These Care Teams include your primary Cardiologist (physician) and Advanced Practice Providers (APPs -  Physician Assistants and Nurse Practitioners) who all work together to provide you with the care you need, when you need it. ° °We recommend signing up for the patient portal called "MyChart".  Sign up information is provided on this After Visit Summary.  MyChart is used to connect with patients for Virtual Visits (Telemedicine).  Patients are able to view lab/test results, encounter notes, upcoming appointments, etc.  Non-urgent messages can be sent to your provider as well.   °To learn more about what you can do with MyChart, go to [email or web address].   ° °Your next appointment:   °4 month(s) ° °The format for your next appointment:   °In Person ° °Provider:   °Robert Krasowski, MD ° ° °Other Instructions ° ° °

## 2020-06-09 ENCOUNTER — Encounter: Payer: Self-pay | Admitting: Oncology

## 2020-06-25 ENCOUNTER — Telehealth: Payer: Self-pay | Admitting: Oncology

## 2020-06-25 ENCOUNTER — Other Ambulatory Visit: Payer: Self-pay | Admitting: Oncology

## 2020-06-25 DIAGNOSIS — C8331 Diffuse large B-cell lymphoma, lymph nodes of head, face, and neck: Secondary | ICD-10-CM

## 2020-06-25 NOTE — Telephone Encounter (Signed)
06/25/20 Lft msg with daughter- upcoming PET SCAN and Dr Appt

## 2020-07-03 ENCOUNTER — Encounter: Payer: Self-pay | Admitting: Oncology

## 2020-07-07 DIAGNOSIS — C8331 Diffuse large B-cell lymphoma, lymph nodes of head, face, and neck: Secondary | ICD-10-CM | POA: Diagnosis not present

## 2020-07-07 DIAGNOSIS — C8590 Non-Hodgkin lymphoma, unspecified, unspecified site: Secondary | ICD-10-CM | POA: Diagnosis not present

## 2020-07-07 NOTE — Progress Notes (Signed)
[facility]  [address] [place],  [place]  [address] [phone]  Clinic Day:  07/09/2020  Referring physician: Angelina Sheriff, MD   This document serves as a record of services personally performed by Hosie Poisson, MD. It was created on their behalf by [facility], a trained medical scribe. The creation of this record is based on the scribe's personal observations and the provider's statements to them.  CHIEF COMPLAINT:  CC: Stage IIA large B-cell lymphoma  Current Treatment:  Surveillance  HISTORY OF PRESENT ILLNESS:  Ryan Ball is a 87 y.o. male with stage IIA diffuse large B-cell lymphoma diagnosed in June 2021.  He presented with voice changes and dysphagia.  CT imaging revealed 2.4 x 3.9 x 4.8 cm posterior tongue base mass, as well as mild bilateral cervical lymphadenopathy.  He saw Dr. Conley Canal and underwent biopsy, which revealed diffuse large B-cell lymphoma, non-germinal center phenotype, which was CD45, CD19, CD20 and CD38 positive.  PET-CT revealed hypermetabolic tongue base mass measuring 4.8 x 2.1 cm with an SUV of 15.5 with bilateral cervical lymph nodes measuring 1.5 cm with an SUV max of 11.3.  He was seen by Dr. Cassell Clement at [facility].  She recommended starting with R-mini-CHOP given every 3 weeks, increasing the doses of chemotherapy with each cycle as tolerated.  Prior to chemotherapy he had an echocardiogram, which revealed mild concentric left ventricular hypertrophy, with mild global hypokinesis of the left ventricle and overall mildly impaired left ventricular systolic function with an ejection fraction of 45-50%.  He had his 1st cycle of R-mini-CHOP, with the chemotherapy given at 50% dose reduction, on July 12th. He received his second cycle at 75% dose and a third cycle at 90%.    PET imaging from September 13th revealed no evidence of metabolically active lymphoma within the neck, chest, abdomen  or pelvis.  The previously demonstrated mass at the base of the tongue has resolved.  He was referred for involved field radiation for consolidation.    INTERVAL HISTORY:  Ryan Ball is here for routine follow up.  PET imaging from February 28th revealed a stable exam with no evidence for metabolically active lymphoma in the neck, chest, abdomen, or pelvis.  He states that he is doing well and denies complaints today other than dry mouth and some back pain due to injury from lifting the garage door.  He has been using Aleve as needed.  He has been active and watching his diet, as he has been trying to lose weight.  He continues to follow up Dr. Agustin Cree.  His hemoglobin has mildly improved from 12.4 to 12.8, and his white count and platelets are normal.  Chemistries are unremarkable including an LDH of 310.  His  appetite is good, and he has lost 3 pounds since his last visit.  He denies fever, chills or other signs of infection.  He denies nausea, vomiting, bowel issues, or abdominal pain.  He denies sore throat, cough, dyspnea, or chest pain.   REVIEW OF SYSTEMS:  Review of Systems  Constitutional: Negative.  Negative for appetite change, chills, fatigue, fever and unexpected weight change.  HENT:  Negative.        Dry mouth and throat  Eyes: Negative.   Respiratory: Negative.  Negative for chest tightness, cough, hemoptysis, shortness of breath and wheezing.   Cardiovascular: Negative.  Negative for chest pain, leg swelling and palpitations.  Gastrointestinal: Negative.  Negative for abdominal distention,  abdominal pain, blood in stool, constipation, diarrhea, nausea and vomiting.  Endocrine: Negative.   Genitourinary: Negative.  Negative for difficulty urinating, dysuria, frequency and hematuria.   Musculoskeletal: Positive for back pain (due to moderate injury). Negative for arthralgias, flank pain, gait problem and myalgias.  Skin: Negative.   Neurological: Negative.  Negative for dizziness,  extremity weakness, gait problem, headaches, light-headedness, numbness, seizures and speech difficulty.  Hematological: Negative.   Psychiatric/Behavioral: Negative.  Negative for depression and sleep disturbance. The patient is not nervous/anxious.   All other systems reviewed and are negative.    VITALS:  Blood pressure (!) 162/79, pulse 80, temperature 97.9 F (36.6 C), temperature source Oral, resp. rate 18, height 5' 9"  (1.753 m), weight 163 lb 14.4 oz (74.3 kg), SpO2 98 %.  Wt Readings from Last 3 Encounters:  07/09/20 163 lb 14.4 oz (74.3 kg)  05/20/20 166 lb (75.3 kg)  04/15/20 167 lb 12 oz (76.1 kg)    Body mass index is 24.2 kg/m.  Performance status (ECOG): 0 - Asymptomatic  PHYSICAL EXAM:  Physical Exam Constitutional:      General: He is not in acute distress.    Appearance: Normal appearance. He is normal weight.  HENT:     Head: Normocephalic and atraumatic.  Eyes:     General: No scleral icterus.    Extraocular Movements: Extraocular movements intact.     Conjunctiva/sclera: Conjunctivae normal.     Pupils: Pupils are equal, round, and reactive to light.  Cardiovascular:     Rate and Rhythm: Normal rate and regular rhythm.     Pulses: Normal pulses.     Heart sounds: Normal heart sounds. No murmur heard. No friction rub. No gallop.   Pulmonary:     Effort: Pulmonary effort is normal. No respiratory distress.     Breath sounds: Normal breath sounds.  Abdominal:     General: Bowel sounds are normal. There is no distension.     Palpations: Abdomen is soft. There is no hepatomegaly, splenomegaly or mass.     Tenderness: There is no abdominal tenderness.  Musculoskeletal:        General: Normal range of motion.     Cervical back: Normal range of motion and neck supple.     Right lower leg: No edema.     Left lower leg: No edema.     Comments: He has kyphosis of the upper spine  Lymphadenopathy:     Cervical: No cervical adenopathy.  Skin:    General:  Skin is warm and dry.  Neurological:     General: No focal deficit present.     Mental Status: He is alert and oriented to person, place, and time. Mental status is at baseline.  Psychiatric:        Mood and Affect: Mood normal.        Behavior: Behavior normal.        Thought Content: Thought content normal.        Judgment: Judgment normal.     LABS:   CBC Latest Ref Rng & Units 04/09/2020  WBC - 5.5  Hemoglobin 13.5 - 17.5 12.4(A)  Hematocrit 41 - 53 38(A)  Platelets 150 - 399 181   CMP Latest Ref Rng & Units 04/09/2020  BUN 4 - 21 12  Creatinine 0.6 - 1.3 0.9  Sodium 137 - 147 139  Potassium 3.4 - 5.3 4.1  Chloride 99 - 108 101  CO2 13 - 22 28(A)  Calcium 8.7 -  10.7 8.9  Alkaline Phos 25 - 125 55  AST 14 - 40 32  ALT 10 - 40 24     Lab Results  Component Value Date   LDH 113 04/09/2020     STUDIES:   He underwent nuclear medicine PET skull base to thigh on 07/07/2020 showing: 1. Stable exam. No evidence for metabolically active lymphoma in the neck, chest, abdomen, or pelvis. 2. Moderate to large hiatal hernia. 3. Diffuse diverticulosis without diverticulitis. 4.  Aortic Atherosclerois (ICD10-170.0)   Allergies: No Known Allergies  Current Medications: Current Outpatient Medications  Medication Sig Dispense Refill  . Cholecalciferol 1.25 MG (50000 UT) WAFR Take 1 Wafer by mouth every 14 (fourteen) days.    . Cyanocobalamin 1000 MCG/ML KIT Inject 1,000 mcg as directed every 30 (thirty) days. Every 4 weeks    . doxepin (SINEQUAN) 25 MG capsule Take 1 capsule by mouth at bedtime as needed.    Marland Kitchen PREVIDENT 5000 DRY MOUTH 1.1 % GEL dental gel Place onto teeth.    . rosuvastatin (CRESTOR) 5 MG tablet Take 1 tablet by mouth every other day.      No current facility-administered medications for this visit.     ASSESSMENT & PLAN:   Assessment:   1. Stage II diffuse large B-cell lymphoma.  He tolerated treatment with R-mini-CHOP without significant  difficulty, and completed 3 cycles at the end of August.  PET imaging from September has revealed remission with no evidence of metabolically active lymphoma.  The mass at the tongue base has resolved.  He completed consolidation radiation in November 2021.  PET imaging from February remains stable with no evidence of residual or recurrent disease.    2. Mildly impaired left ventricular systolic function with an EF between 45-50%. He continues to follow up with Dr. Agustin Cree, his cardiologist.   3. Anemia, mildly improved, likely secondary to disease and chemotherapy.    Plan: PET imaging remains stable with no evidence of recurrent or residual disease, so he is considered in remission.  We will plan for CT imaging in 6 months.  He may have his port removed at any time if he wishes, but if he decides to keep it, we will flush this every 3 months at his routine follow ups.  We will see him back in 3 months with CBC, CMP, LDH for repeat examination.  The patient understands the plans discussed today and is in agreement with them.  He knows to contact our office if he develops concerns prior to his next appointment.   I provided 15 minutes of face-to-face time during this this encounter and > 50% was spent counseling as documented under my assessment and plan.    [name], MD [facility] AT [facility] [address] [place] [address] Dept: [phone] Dept Fax: [phone]   I, Rita Ohara, am acting as scribe for [name], MD  I have reviewed this report as typed by the medical scribe, and it is complete and accurate.

## 2020-07-09 ENCOUNTER — Other Ambulatory Visit: Payer: Self-pay | Admitting: Oncology

## 2020-07-09 ENCOUNTER — Inpatient Hospital Stay: Payer: Medicare Other | Attending: Oncology

## 2020-07-09 ENCOUNTER — Telehealth: Payer: Self-pay | Admitting: Oncology

## 2020-07-09 ENCOUNTER — Other Ambulatory Visit: Payer: Self-pay

## 2020-07-09 ENCOUNTER — Other Ambulatory Visit: Payer: Self-pay | Admitting: Hematology and Oncology

## 2020-07-09 ENCOUNTER — Encounter: Payer: Self-pay | Admitting: Oncology

## 2020-07-09 ENCOUNTER — Inpatient Hospital Stay (INDEPENDENT_AMBULATORY_CARE_PROVIDER_SITE_OTHER): Payer: Medicare Other | Admitting: Oncology

## 2020-07-09 VITALS — BP 162/79 | HR 80 | Temp 97.9°F | Resp 18 | Ht 69.0 in | Wt 163.9 lb

## 2020-07-09 DIAGNOSIS — D6481 Anemia due to antineoplastic chemotherapy: Secondary | ICD-10-CM | POA: Diagnosis not present

## 2020-07-09 DIAGNOSIS — D649 Anemia, unspecified: Secondary | ICD-10-CM | POA: Diagnosis not present

## 2020-07-09 DIAGNOSIS — C8331 Diffuse large B-cell lymphoma, lymph nodes of head, face, and neck: Secondary | ICD-10-CM

## 2020-07-09 LAB — BASIC METABOLIC PANEL
BUN: 14 (ref 4–21)
CO2: 28 — AB (ref 13–22)
Chloride: 105 (ref 99–108)
Creatinine: 0.9 (ref 0.6–1.3)
Glucose: 114
Potassium: 4 (ref 3.4–5.3)
Sodium: 139 (ref 137–147)

## 2020-07-09 LAB — CBC AND DIFFERENTIAL
HCT: 39 — AB (ref 41–53)
Hemoglobin: 12.8 — AB (ref 13.5–17.5)
Neutrophils Absolute: 4.1
Platelets: 172 (ref 150–399)
WBC: 5.4

## 2020-07-09 LAB — HEPATIC FUNCTION PANEL
ALT: 18 (ref 10–40)
AST: 28 (ref 14–40)
Alkaline Phosphatase: 65 (ref 25–125)
Bilirubin, Total: 0.6

## 2020-07-09 LAB — CBC: RBC: 4.44 (ref 3.87–5.11)

## 2020-07-09 LAB — COMPREHENSIVE METABOLIC PANEL
Albumin: 4 (ref 3.5–5.0)
Calcium: 9.3 (ref 8.7–10.7)

## 2020-07-09 NOTE — Telephone Encounter (Signed)
Per 3/2 LOS, patient scheduled for June Appt's.  Gave patient Appt Summary

## 2020-07-21 DIAGNOSIS — D510 Vitamin B12 deficiency anemia due to intrinsic factor deficiency: Secondary | ICD-10-CM | POA: Diagnosis not present

## 2020-08-19 DIAGNOSIS — D510 Vitamin B12 deficiency anemia due to intrinsic factor deficiency: Secondary | ICD-10-CM | POA: Diagnosis not present

## 2020-08-28 DIAGNOSIS — L570 Actinic keratosis: Secondary | ICD-10-CM | POA: Diagnosis not present

## 2020-08-28 DIAGNOSIS — L300 Nummular dermatitis: Secondary | ICD-10-CM | POA: Diagnosis not present

## 2020-08-28 DIAGNOSIS — Z85820 Personal history of malignant melanoma of skin: Secondary | ICD-10-CM | POA: Diagnosis not present

## 2020-09-09 DIAGNOSIS — L853 Xerosis cutis: Secondary | ICD-10-CM | POA: Diagnosis not present

## 2020-09-09 DIAGNOSIS — L570 Actinic keratosis: Secondary | ICD-10-CM | POA: Diagnosis not present

## 2020-09-09 DIAGNOSIS — C44722 Squamous cell carcinoma of skin of right lower limb, including hip: Secondary | ICD-10-CM | POA: Diagnosis not present

## 2020-09-10 DIAGNOSIS — Z203 Contact with and (suspected) exposure to rabies: Secondary | ICD-10-CM | POA: Diagnosis not present

## 2020-09-10 DIAGNOSIS — Z23 Encounter for immunization: Secondary | ICD-10-CM | POA: Diagnosis not present

## 2020-09-10 DIAGNOSIS — S51811A Laceration without foreign body of right forearm, initial encounter: Secondary | ICD-10-CM | POA: Diagnosis not present

## 2020-09-10 DIAGNOSIS — Z2914 Encounter for prophylactic rabies immune globin: Secondary | ICD-10-CM | POA: Diagnosis not present

## 2020-09-10 DIAGNOSIS — S51851A Open bite of right forearm, initial encounter: Secondary | ICD-10-CM | POA: Diagnosis not present

## 2020-09-11 DIAGNOSIS — S51859A Open bite of unspecified forearm, initial encounter: Secondary | ICD-10-CM | POA: Diagnosis not present

## 2020-09-11 DIAGNOSIS — Z6824 Body mass index (BMI) 24.0-24.9, adult: Secondary | ICD-10-CM | POA: Diagnosis not present

## 2020-09-11 DIAGNOSIS — E7800 Pure hypercholesterolemia, unspecified: Secondary | ICD-10-CM | POA: Diagnosis not present

## 2020-09-11 DIAGNOSIS — W540XXA Bitten by dog, initial encounter: Secondary | ICD-10-CM | POA: Diagnosis not present

## 2020-09-13 DIAGNOSIS — Z23 Encounter for immunization: Secondary | ICD-10-CM | POA: Diagnosis not present

## 2020-09-13 DIAGNOSIS — Z2914 Encounter for prophylactic rabies immune globin: Secondary | ICD-10-CM | POA: Diagnosis not present

## 2020-09-13 DIAGNOSIS — Z203 Contact with and (suspected) exposure to rabies: Secondary | ICD-10-CM | POA: Diagnosis not present

## 2020-09-17 ENCOUNTER — Other Ambulatory Visit: Payer: Self-pay

## 2020-09-17 ENCOUNTER — Ambulatory Visit (INDEPENDENT_AMBULATORY_CARE_PROVIDER_SITE_OTHER): Payer: Medicare Other | Admitting: Cardiology

## 2020-09-17 ENCOUNTER — Encounter: Payer: Self-pay | Admitting: Cardiology

## 2020-09-17 VITALS — BP 98/58 | HR 49 | Ht 69.0 in | Wt 163.0 lb

## 2020-09-17 DIAGNOSIS — I420 Dilated cardiomyopathy: Secondary | ICD-10-CM

## 2020-09-17 DIAGNOSIS — I471 Supraventricular tachycardia: Secondary | ICD-10-CM

## 2020-09-17 DIAGNOSIS — C8331 Diffuse large B-cell lymphoma, lymph nodes of head, face, and neck: Secondary | ICD-10-CM | POA: Diagnosis not present

## 2020-09-17 DIAGNOSIS — Z2914 Encounter for prophylactic rabies immune globin: Secondary | ICD-10-CM | POA: Diagnosis not present

## 2020-09-17 DIAGNOSIS — Z23 Encounter for immunization: Secondary | ICD-10-CM | POA: Diagnosis not present

## 2020-09-17 DIAGNOSIS — Z203 Contact with and (suspected) exposure to rabies: Secondary | ICD-10-CM | POA: Diagnosis not present

## 2020-09-17 DIAGNOSIS — I350 Nonrheumatic aortic (valve) stenosis: Secondary | ICD-10-CM

## 2020-09-17 NOTE — Addendum Note (Signed)
Addended by: Senaida Ores on: 09/17/2020 11:43 AM   Modules accepted: Orders

## 2020-09-17 NOTE — Progress Notes (Signed)
Cardiology Office Note:    Date:  09/17/2020   ID:  Ryan Ball, DOB 1930/10/16, MRN [id number]  PCP:  Angelina Sheriff, MD  Cardiologist:  Jenne Campus, MD    Referring MD: Angelina Sheriff, MD   Chief Complaint  Patient presents with  . Follow-up  I am doing fine  History of Present Illness:    [name] is a 87 y.o. male with past medical history significant for cardiomyopathy ejection fraction 45%, stress test negative, supraventricular tachycardia, asymptomatic.  Also diffuse large B-cell lymphoma of the tongue treated and excellently by oncology team.  He comes today 2 months for follow-up overall doing very well he said he still trying to be active he still walk on the regular basis have no difficulty doing it.  Denies any chest pain tightness squeezing pressure burning chest no swelling of lower extremities no palpitations.  Interesting story he was in [place] home [place] and he was bitten by dog now he takes rabies vaccine.  Past Medical History:  Diagnosis Date  . Anemia due to antineoplastic chemotherapy 04/13/2020  . Atypical chest pain 10/10/2018  . Cardiomegaly 06/08/2016  . Diffuse large B-cell lymphoma ([facility]) 10/30/2019  . Dilated cardiomyopathy ([facility]) ejection fraction 45% in summer 2021 by echo 05/20/2020  . Encounter for venous access device care 04/09/2020  . Esophageal reflux 10/10/2018  . Hypercholesteremia 10/10/2018  . Postoperative examination 12/12/2019  . Senile nuclear sclerosis 05/19/2012  . Senile nuclear sclerosis, bilateral 11/02/2019  . Sleep disturbance 10/10/2018  . SVT (supraventricular tachycardia) ([place]) 09/20/2019    Past Surgical History:  Procedure Laterality Date  . CHOLECYSTECTOMY      Current Medications: Current Meds  Medication Sig  . alendronate (FOSAMAX) 10 MG tablet Take 10 mg by mouth daily.  . Cyanocobalamin 1000 MCG/ML KIT Inject 1,000 mcg as directed every 30 (thirty) days. Every 4 weeks  . doxepin  (SINEQUAN) 25 MG capsule Take 1 capsule by mouth at bedtime as needed (Sleeping).  . rosuvastatin (CRESTOR) 5 MG tablet Take 1 tablet by mouth every other day.      Allergies:   Patient has no known allergies.   Social History   Socioeconomic History  . Marital status: Widowed    Spouse name: Not on file  . Number of children: Not on file  . Years of education: Not on file  . Highest education level: Not on file  Occupational History  . Not on file  Tobacco Use  . Smoking status: Never Smoker  . Smokeless tobacco: Never Used  Substance and Sexual Activity  . Alcohol use: Yes    Alcohol/week: 6.0 standard drinks    Types: 6 Glasses of wine per week    Comment: 1-2 glasses of wine 2-3 times a week  . Drug use: Never  . Sexual activity: Not on file  Other Topics Concern  . Not on file  Social History Narrative  . Not on file   Social Determinants of Health   Financial Resource Strain: Not on file  Food Insecurity: Not on file  Transportation Needs: Not on file  Physical Activity: Not on file  Stress: Not on file  Social Connections: Not on file     Family History: The patient's family history includes CAD in his brother and father. ROS:   Please see the history of present illness.    All 14 point review of systems negative except as described per history of present illness  EKGs/Labs/Other  Studies Reviewed:      Recent Labs: 07/09/2020: ALT 18; BUN 14; Creatinine 0.9; Hemoglobin 12.8; Platelets 172; Potassium 4.0; Sodium 139  Recent Lipid Panel No results found for: CHOL, TRIG, HDL, CHOLHDL, VLDL, LDLCALC, LDLDIRECT  Physical Exam:    VS:  BP (!) 98/58 (BP Location: Left Arm, Patient Position: Sitting)   Pulse (!) 49   Ht 5' 9"  (1.753 m)   Wt 163 lb (73.9 kg)   SpO2 94%   BMI 24.07 kg/m     Wt Readings from Last 3 Encounters:  09/17/20 163 lb (73.9 kg)  07/09/20 163 lb 14.4 oz (74.3 kg)  05/20/20 166 lb (75.3 kg)     GEN:  Well nourished, well  developed in no acute distress HEENT: Normal NECK: No JVD; No carotid bruits LYMPHATICS: No lymphadenopathy CARDIAC: RRR, systolic murmur grade 2/6 best heard right upper portion of the sternum, no rubs, no gallops RESPIRATORY:  Clear to auscultation without rales, wheezing or rhonchi  ABDOMEN: Soft, non-tender, non-distended MUSCULOSKELETAL:  No edema; No deformity  SKIN: Warm and dry LOWER EXTREMITIES: no swelling NEUROLOGIC:  Alert and oriented x 3 PSYCHIATRIC:  Normal affect   ASSESSMENT:    1. Dilated cardiomyopathy (HCC) ejection fraction 45% in summer 2021 by echo   2. SVT (supraventricular tachycardia) ([facility])   3. Diffuse large B-cell lymphoma of lymph nodes of neck (HCC)   4. Nonrheumatic aortic valve stenosis    PLAN:    In order of problems listed above:  1. Dilated cardiomyopathy ejection fraction 45% in summer 2021 by echocardiogram.  Plan to repeat his echocardiogram which we will do.  He does not want to take any medication he said he is feeling well and does not want to take anything and honestly with his low blood pressure it would be very difficult task to try to put him on his appropriate medications. 2. Supraventricular tachycardia completely asymptomatic again does not want to have any medications for it. 3. Diffuse large B-cell lymphoma doing well from that point review followed by oncology team. 4. Systolic ejection murmur right upper portion of the sternum I schedule him to have an echocardiogram again to look at the lesion. 5. I did review oncology notes for this visit.   Medication Adjustments/Labs and Tests Ordered: Current medicines are reviewed at length with the patient today.  Concerns regarding medicines are outlined above.  No orders of the defined types were placed in this encounter.  Medication changes: No orders of the defined types were placed in this encounter.   Signed, Park Liter, MD, [facility] 09/17/2020 11:39 AM    [place]

## 2020-09-17 NOTE — Patient Instructions (Signed)
Medication Instructions:  Your physician recommends that you continue on your current medications as directed. Please refer to the Current Medication list given to you today.  *If you need a refill on your cardiac medications before your next appointment, please call your pharmacy*   Lab Work: None If you have labs (blood work) drawn today and your tests are completely normal, you will receive your results only by: Marland Kitchen MyChart Message (if you have MyChart) OR . A paper copy in the mail If you have any lab test that is abnormal or we need to change your treatment, we will call you to review the results.   Testing/Procedures: Your physician has requested that you have an echocardiogram. Echocardiography is a painless test that uses sound waves to create images of your heart. It provides your doctor with information about the size and shape of your heart and how well your heart's chambers and valves are working. This procedure takes approximately one hour. There are no restrictions for this procedure.     Follow-Up: At [facility], you and your health needs are our priority.  As part of our continuing mission to provide you with exceptional heart care, we have created designated Provider Care Teams.  These Care Teams include your primary Cardiologist (physician) and Advanced Practice Providers (APPs -  Physician Assistants and Nurse Practitioners) who all work together to provide you with the care you need, when you need it.  We recommend signing up for the patient portal called "MyChart".  Sign up information is provided on this After Visit Summary.  MyChart is used to connect with patients for Virtual Visits (Telemedicine).  Patients are able to view lab/test results, encounter notes, upcoming appointments, etc.  Non-urgent messages can be sent to your provider as well.   To learn more about what you can do with MyChart, go to [email or web address].    Your next appointment:   5  month(s)  The format for your next appointment:   In Person  Provider:   Jenne Campus, MD   Other Instructions   Echocardiogram An echocardiogram is a test that uses sound waves (ultrasound) to produce images of the heart. Images from an echocardiogram can provide important information about:  Heart size and shape.  The size and thickness and movement of your heart's walls.  Heart muscle function and strength.  Heart valve function or if you have stenosis. Stenosis is when the heart valves are too narrow.  If blood is flowing backward through the heart valves (regurgitation).  A tumor or infectious growth around the heart valves.  Areas of heart muscle that are not working well because of poor blood flow or injury from a heart attack.  Aneurysm detection. An aneurysm is a weak or damaged part of an artery wall. The wall bulges out from the normal force of blood pumping through the body. Tell a health care provider about:  Any allergies you have.  All medicines you are taking, including vitamins, herbs, eye drops, creams, and over-the-counter medicines.  Any blood disorders you have.  Any surgeries you have had.  Any medical conditions you have.  Whether you are pregnant or may be pregnant. What are the risks? Generally, this is a safe test. However, problems may occur, including an allergic reaction to dye (contrast) that may be used during the test. What happens before the test? No specific preparation is needed. You may eat and drink normally. What happens during the test?  You will take off your  clothes from the waist up and put on a hospital gown.  Electrodes or electrocardiogram (ECG)patches may be placed on your chest. The electrodes or patches are then connected to a device that monitors your heart rate and rhythm.  You will lie down on a table for an ultrasound exam. A gel will be applied to your chest to help sound waves pass through your skin.  A  handheld device, called a transducer, will be pressed against your chest and moved over your heart. The transducer produces sound waves that travel to your heart and bounce back (or "echo" back) to the transducer. These sound waves will be captured in real-time and changed into images of your heart that can be viewed on a video monitor. The images will be recorded on a computer and reviewed by your health care provider.  You may be asked to change positions or hold your breath for a short time. This makes it easier to get different views or better views of your heart.  In some cases, you may receive contrast through an IV in one of your veins. This can improve the quality of the pictures from your heart. The procedure may vary among health care providers and hospitals.   What can I expect after the test? You may return to your normal, everyday life, including diet, activities, and medicines, unless your health care provider tells you not to do that. Follow these instructions at home:  It is up to you to get the results of your test. Ask your health care provider, or the department that is doing the test, when your results will be ready.  Keep all follow-up visits. This is important. Summary  An echocardiogram is a test that uses sound waves (ultrasound) to produce images of the heart.  Images from an echocardiogram can provide important information about the size and shape of your heart, heart muscle function, heart valve function, and other possible heart problems.  You do not need to do anything to prepare before this test. You may eat and drink normally.  After the echocardiogram is completed, you may return to your normal, everyday life, unless your health care provider tells you not to do that. This information is not intended to replace advice given to you by your health care provider. Make sure you discuss any questions you have with your health care provider. Document Revised:  12/18/2019 Document Reviewed: 12/18/2019 Elsevier Patient Education  2021 [employer]vier [employer].   

## 2020-09-18 ENCOUNTER — Ambulatory Visit: Payer: Medicare Other | Admitting: Cardiology

## 2020-09-24 DIAGNOSIS — Z203 Contact with and (suspected) exposure to rabies: Secondary | ICD-10-CM | POA: Diagnosis not present

## 2020-09-24 DIAGNOSIS — Z2914 Encounter for prophylactic rabies immune globin: Secondary | ICD-10-CM | POA: Diagnosis not present

## 2020-09-24 DIAGNOSIS — Z23 Encounter for immunization: Secondary | ICD-10-CM | POA: Diagnosis not present

## 2020-10-07 DIAGNOSIS — E559 Vitamin D deficiency, unspecified: Secondary | ICD-10-CM | POA: Diagnosis not present

## 2020-10-07 DIAGNOSIS — Z6824 Body mass index (BMI) 24.0-24.9, adult: Secondary | ICD-10-CM | POA: Diagnosis not present

## 2020-10-07 DIAGNOSIS — Z79899 Other long term (current) drug therapy: Secondary | ICD-10-CM | POA: Diagnosis not present

## 2020-10-07 DIAGNOSIS — Z0000 Encounter for general adult medical examination without abnormal findings: Secondary | ICD-10-CM | POA: Diagnosis not present

## 2020-10-07 DIAGNOSIS — Z1331 Encounter for screening for depression: Secondary | ICD-10-CM | POA: Diagnosis not present

## 2020-10-07 DIAGNOSIS — I700 Atherosclerosis of aorta: Secondary | ICD-10-CM | POA: Diagnosis not present

## 2020-10-07 DIAGNOSIS — M8580 Other specified disorders of bone density and structure, unspecified site: Secondary | ICD-10-CM | POA: Diagnosis not present

## 2020-10-07 DIAGNOSIS — E7800 Pure hypercholesterolemia, unspecified: Secondary | ICD-10-CM | POA: Diagnosis not present

## 2020-10-08 DIAGNOSIS — Z23 Encounter for immunization: Secondary | ICD-10-CM | POA: Diagnosis not present

## 2020-10-08 DIAGNOSIS — Z203 Contact with and (suspected) exposure to rabies: Secondary | ICD-10-CM | POA: Diagnosis not present

## 2020-10-08 DIAGNOSIS — Z2914 Encounter for prophylactic rabies immune globin: Secondary | ICD-10-CM | POA: Diagnosis not present

## 2020-10-09 ENCOUNTER — Inpatient Hospital Stay: Payer: Medicare Other | Attending: Oncology

## 2020-10-09 ENCOUNTER — Inpatient Hospital Stay: Payer: Medicare Other

## 2020-10-09 ENCOUNTER — Encounter: Payer: Self-pay | Admitting: Hematology and Oncology

## 2020-10-09 ENCOUNTER — Inpatient Hospital Stay (INDEPENDENT_AMBULATORY_CARE_PROVIDER_SITE_OTHER): Payer: Medicare Other | Admitting: Hematology and Oncology

## 2020-10-09 ENCOUNTER — Other Ambulatory Visit: Payer: Medicare Other

## 2020-10-09 ENCOUNTER — Other Ambulatory Visit: Payer: Self-pay

## 2020-10-09 ENCOUNTER — Other Ambulatory Visit: Payer: Self-pay | Admitting: Hematology and Oncology

## 2020-10-09 VITALS — BP 153/70 | HR 70 | Temp 98.3°F | Resp 18 | Ht 69.0 in | Wt 163.2 lb

## 2020-10-09 DIAGNOSIS — Z452 Encounter for adjustment and management of vascular access device: Secondary | ICD-10-CM | POA: Insufficient documentation

## 2020-10-09 DIAGNOSIS — D6481 Anemia due to antineoplastic chemotherapy: Secondary | ICD-10-CM | POA: Diagnosis not present

## 2020-10-09 DIAGNOSIS — C8331 Diffuse large B-cell lymphoma, lymph nodes of head, face, and neck: Secondary | ICD-10-CM

## 2020-10-09 DIAGNOSIS — Z8572 Personal history of non-Hodgkin lymphomas: Secondary | ICD-10-CM | POA: Diagnosis not present

## 2020-10-09 DIAGNOSIS — D649 Anemia, unspecified: Secondary | ICD-10-CM | POA: Diagnosis not present

## 2020-10-09 LAB — BASIC METABOLIC PANEL
BUN: 15 (ref 4–21)
CO2: 29 — AB (ref 13–22)
Chloride: 104 (ref 99–108)
Creatinine: 0.8 (ref 0.6–1.3)
Glucose: 81
Potassium: 4.3 (ref 3.4–5.3)
Sodium: 140 (ref 137–147)

## 2020-10-09 LAB — HEPATIC FUNCTION PANEL
ALT: 16 (ref 10–40)
AST: 25 (ref 14–40)
Alkaline Phosphatase: 65 (ref 25–125)
Bilirubin, Total: 0.6

## 2020-10-09 LAB — CBC AND DIFFERENTIAL
HCT: 39 — AB (ref 41–53)
Hemoglobin: 12.9 — AB (ref 13.5–17.5)
Neutrophils Absolute: 3.25
Platelets: 151 (ref 150–399)
WBC: 5

## 2020-10-09 LAB — LACTATE DEHYDROGENASE: LDH: 110 U/L (ref 98–192)

## 2020-10-09 LAB — COMPREHENSIVE METABOLIC PANEL
Albumin: 4.1 (ref 3.5–5.0)
Calcium: 9 (ref 8.7–10.7)

## 2020-10-09 LAB — CBC: RBC: 4.41 (ref 3.87–5.11)

## 2020-10-09 MED ORDER — HEPARIN SOD (PORK) LOCK FLUSH 100 UNIT/ML IV SOLN
500.0000 [IU] | Freq: Once | INTRAVENOUS | Status: AC | PRN
Start: 2020-10-09 — End: 2020-10-09
  Administered 2020-10-09: 500 [IU]
  Filled 2020-10-09: qty 5

## 2020-10-09 MED ORDER — SODIUM CHLORIDE 0.9% FLUSH
10.0000 mL | INTRAVENOUS | Status: DC | PRN
  Administered 2020-10-09: 10 mL
  Filled 2020-10-09: qty 10

## 2020-10-09 NOTE — Progress Notes (Signed)
Ryan Ball  [address] [place],  [place]  [address] [phone]  Clinic Day:  10/09/2020  Referring physician: Angelina Sheriff, MD    CHIEF COMPLAINT:  CC: An 87 year old male with history of  Stage IIA large B-cell lymphoma here for 3 month evaluation  Current Treatment:  Surveillance  HISTORY OF PRESENT ILLNESS:  [name] is a 87 y.o. male with stage IIA diffuse large B-cell lymphoma diagnosed in June 2021.  He presented with voice changes and dysphagia.  CT imaging revealed 2.4 x 3.9 x 4.8 cm posterior tongue base mass, as well as mild bilateral cervical lymphadenopathy.  He saw Dr. Conley Canal and underwent biopsy, which revealed diffuse large B-cell lymphoma, non-germinal center phenotype, which was CD45, CD19, CD20 and CD38 positive.  PET-CT revealed hypermetabolic tongue base mass measuring 4.8 x 2.1 cm with an SUV of 15.5 with bilateral cervical lymph nodes measuring 1.5 cm with an SUV max of 11.3.  He was seen by Dr. Cassell Clement at [facility].  She recommended starting with R-mini-CHOP given every 3 weeks, increasing the doses of chemotherapy with each cycle as tolerated.  Prior to chemotherapy he had an echocardiogram, which revealed mild concentric left ventricular hypertrophy, with mild global hypokinesis of the left ventricle and overall mildly impaired left ventricular systolic function with an ejection fraction of 45-50%.  He had his 1st cycle of R-mini-CHOP, with the chemotherapy given at 50% dose reduction, on July 12th. He received his second cycle at 75% dose and a third cycle at 90%. PET imaging from September 13th revealed no evidence of metabolically active lymphoma within the neck, chest, abdomen or pelvis.  The previously demonstrated mass at the base of the tongue has resolved.  He was referred for involved field radiation for consolidation.  PET imaging from February 28th revealed a stable exam with no evidence  for metabolically active lymphoma in the neck, chest, abdomen, or pelvis.    INTERVAL HISTORY:  Ryan Ball is here for 3 month evaluation. He has been well since his last visit other than suffering a dog bite. He was in [place] and in a crowd, became too close to a cart with a dog who bit him on the right arm. The owner's of the dog did not speak [place] and were not cooperative in the process. Tavin, therefor went to the ED where he was treated and started on rabies vaccines. He will complete these tomorrow. He was also diagnosed with a skin cancer to his right leg, but has delayed having this removed until [place] beach [place] with his grandchildren. His sleep is fair and he has been recommended melatonin by his PCP. He is going to try that this weekend. He denies fever, chills, nausea or vomiting. He denies issue with swallowing. He denies shortness of breath, chest pain or cough. He denies issue with bowel or bladder. He denies pain. He continues to have some anxiety, but doesn't wish to be medicated at this time. CBC and CMP are unremarkable.   REVIEW OF SYSTEMS:  Review of Systems  Constitutional: Negative for appetite change, chills, diaphoresis, fatigue, fever and unexpected weight change.  HENT:   Positive for hearing loss. Negative for lump/mass, mouth sores, nosebleeds, sore throat, tinnitus, trouble swallowing and voice change.   Eyes: Negative for eye problems and icterus.  Respiratory: Negative for chest tightness, cough, hemoptysis, shortness of breath and wheezing.   Cardiovascular: Negative for  chest pain, leg swelling and palpitations.  Gastrointestinal: Negative for abdominal distention, abdominal pain, blood in stool, constipation, diarrhea, nausea, rectal pain and vomiting.  Endocrine: Negative for hot flashes.  Genitourinary: Negative for bladder incontinence, difficulty urinating, dyspareunia, dysuria, frequency, hematuria and nocturia.   Musculoskeletal: Positive  for back pain. Negative for arthralgias, flank pain, gait problem, myalgias, neck pain and neck stiffness.  Skin: Negative for itching, rash and wound.  Neurological: Negative for dizziness, extremity weakness, gait problem, headaches, light-headedness, numbness, seizures and speech difficulty.  Hematological: Negative for adenopathy. Does not bruise/bleed easily.  Psychiatric/Behavioral: Positive for sleep disturbance. Negative for confusion, decreased concentration, depression and suicidal ideas. The patient is nervous/anxious.      VITALS:  Blood pressure (!) 153/70, pulse 70, temperature 98.3 F (36.8 C), temperature source Oral, resp. rate 18, height _0  (1.753 m), weight 163 lb 3.2 oz (74 kg), SpO2 97 %.  Wt Readings from Last 3 Encounters:  10/09/20 163 lb 3.2 oz (74 kg)  09/17/20 163 lb (73.9 kg)  07/09/20 163 lb 14.4 oz (74.3 kg)    Body mass index is 24.1 kg/m.  Performance status (ECOG): 0 - Asymptomatic  PHYSICAL EXAM:  Physical Exam Constitutional:      General: He is not in acute distress.    Appearance: Normal appearance. He is normal weight.  HENT:     Head: Normocephalic and atraumatic.  Eyes:     General: No scleral icterus.    Extraocular Movements: Extraocular movements intact.     Conjunctiva/sclera: Conjunctivae normal.     Pupils: Pupils are equal, round, and reactive to light.  Cardiovascular:     Rate and Rhythm: Normal rate and regular rhythm.     Pulses: Normal pulses.     Heart sounds: Normal heart sounds. No murmur heard. No friction rub. No gallop.   Pulmonary:     Effort: Pulmonary effort is normal. No respiratory distress.     Breath sounds: Normal breath sounds.  Abdominal:     General: Bowel sounds are normal. There is no distension.     Palpations: Abdomen is soft. There is no hepatomegaly, splenomegaly or mass.     Tenderness: There is no abdominal tenderness.  Musculoskeletal:        General: Normal range of motion.     Cervical  back: Normal range of motion and neck supple.     Right lower leg: No edema.     Left lower leg: No edema.     Comments: He has kyphosis of the upper spine  Lymphadenopathy:     Cervical: No cervical adenopathy.  Skin:    General: Skin is warm and dry.  Neurological:     General: No focal deficit present.     Mental Status: He is alert and oriented to person, place, and time. Mental status is at baseline.  Psychiatric:        Mood and Affect: Mood normal.        Behavior: Behavior normal.        Thought Content: Thought content normal.        Judgment: Judgment normal.     LABS:   CBC Latest Ref Rng & Units 10/09/2020 07/09/2020 04/09/2020  WBC - 5.0 5.4 5.5  Hemoglobin 13.5 - 17.5 12.9(A) 12.8(A) 12.4(A)  Hematocrit 41 - 53 39(A) 39(A) 38(A)  Platelets 150 - 399 151 172 181   CMP Latest Ref Rng & Units 10/09/2020 07/09/2020 04/09/2020  BUN 4 - 21 15  14 12  Creatinine 0.6 - 1.3 0.8 0.9 0.9  Sodium 137 - 147 140 139 139  Potassium 3.4 - 5.3 4.3 4.0 4.1  Chloride 99 - 108 104 105 101  CO2 13 - 22 29(A) 28(A) 28(A)  Calcium 8.7 - 10.7 9.0 9.3 8.9  Alkaline Phos 25 - 125 65 65 55  AST 14 - 40 25 28 32  ALT 10 - 40 _0 Lab Results  Component Value Date   LDH 113 04/09/2020     STUDIES:    Allergies: No Known Allergies  Current Medications: Current Outpatient Medications  Medication Sig Dispense Refill  . alendronate (FOSAMAX) 10 MG tablet Take 10 mg by mouth daily.    . Cyanocobalamin 1000 MCG/ML KIT Inject 1,000 mcg as directed every 30 (thirty) days. Every 4 weeks    . doxepin (SINEQUAN) 25 MG capsule Take 1 capsule by mouth at bedtime as needed (Sleeping).    . REPLESTA NX 350 MCG (14000 UT) WAFR Take by mouth.    . rosuvastatin (CRESTOR) 5 MG tablet Take 1 tablet by mouth every other day.      Current Facility-Administered Medications  Medication Dose Route Frequency Provider Last Rate Last Admin  . sodium chloride flush (NS) 0.9 % injection 10 mL  10 mL  Intracatheter PRN [place] Scrape A, NP   10 mL at 10/09/20 1038     ASSESSMENT & PLAN:   Assessment:   1. Stage II diffuse large B-cell lymphoma.  He tolerated treatment with R-mini-CHOP without significant difficulty, and completed 3 cycles at the end of August.  PET imaging from September has revealed remission with no evidence of metabolically active lymphoma.  The mass at the tongue base has resolved.  He completed consolidation radiation in November 2021.  PET imaging from February remains stable with no evidence of residual or recurrent disease.    2. Mildly impaired left ventricular systolic function with an EF between 45-50%. He continues to follow up with Dr. Agustin Cree, his cardiologist.   3. Anemia, mildly improved, likely secondary to disease and chemotherapy.    Plan: He continues to remain without evidence of disease. We will repeat imaging in September and see him back in clinic with repeat evaluation.   He verbalizes understanding of and agreement to the plans discussed today. He knows to call the office should any new questions or concerns arise.       Melodye Ped, NP [facility] AT [facility] [address] [place] [address] Dept: [phone] Dept Fax: [phone]

## 2020-10-10 ENCOUNTER — Ambulatory Visit (INDEPENDENT_AMBULATORY_CARE_PROVIDER_SITE_OTHER): Payer: Medicare Other

## 2020-10-10 DIAGNOSIS — I420 Dilated cardiomyopathy: Secondary | ICD-10-CM

## 2020-10-10 LAB — ECHOCARDIOGRAM COMPLETE
AR max vel: 1.07 cm2
AV Area VTI: 1.11 cm2
AV Area mean vel: 1.04 cm2
AV Mean grad: 13 mmHg
AV Peak grad: 23.8 mmHg
Ao pk vel: 2.44 m/s
Area-P 1/2: 2.83 cm2
S' Lateral: 3.2 cm

## 2020-10-10 NOTE — Progress Notes (Signed)
Complete echocardiogram w/strain performed.  Jimmy Arhan Mcmanamon RDCS, RVT

## 2020-10-15 ENCOUNTER — Telehealth: Payer: Self-pay | Admitting: Cardiology

## 2020-10-15 NOTE — Telephone Encounter (Signed)
Patient informed of results.  

## 2020-10-15 NOTE — Telephone Encounter (Signed)
Pt is returning call from earlier today. Pt is not sure why our office is calling him because he does not have any upcoming appts. Please advise

## 2020-10-22 DIAGNOSIS — D510 Vitamin B12 deficiency anemia due to intrinsic factor deficiency: Secondary | ICD-10-CM | POA: Diagnosis not present

## 2020-10-28 DIAGNOSIS — L209 Atopic dermatitis, unspecified: Secondary | ICD-10-CM | POA: Diagnosis not present

## 2020-11-13 DIAGNOSIS — Z6824 Body mass index (BMI) 24.0-24.9, adult: Secondary | ICD-10-CM | POA: Diagnosis not present

## 2020-11-13 DIAGNOSIS — L03119 Cellulitis of unspecified part of limb: Secondary | ICD-10-CM | POA: Diagnosis not present

## 2020-11-13 DIAGNOSIS — I951 Orthostatic hypotension: Secondary | ICD-10-CM | POA: Diagnosis not present

## 2020-11-13 DIAGNOSIS — R6883 Chills (without fever): Secondary | ICD-10-CM | POA: Diagnosis not present

## 2020-12-17 DIAGNOSIS — L570 Actinic keratosis: Secondary | ICD-10-CM | POA: Diagnosis not present

## 2020-12-17 DIAGNOSIS — D0472 Carcinoma in situ of skin of left lower limb, including hip: Secondary | ICD-10-CM | POA: Diagnosis not present

## 2020-12-17 DIAGNOSIS — C44722 Squamous cell carcinoma of skin of right lower limb, including hip: Secondary | ICD-10-CM | POA: Diagnosis not present

## 2021-01-09 ENCOUNTER — Encounter (HOSPITAL_COMMUNITY)
Admission: RE | Admit: 2021-01-09 | Discharge: 2021-01-09 | Disposition: A | Discharge status: Home / Self Care | Payer: Medicare Other | Source: Ambulatory Visit | Attending: Hematology and Oncology | Admitting: Hematology and Oncology

## 2021-01-09 ENCOUNTER — Other Ambulatory Visit: Payer: Self-pay

## 2021-01-09 ENCOUNTER — Other Ambulatory Visit: Payer: Medicare Other

## 2021-01-09 ENCOUNTER — Ambulatory Visit: Payer: Medicare Other | Admitting: Oncology

## 2021-01-09 DIAGNOSIS — C8331 Diffuse large B-cell lymphoma, lymph nodes of head, face, and neck: Secondary | ICD-10-CM | POA: Insufficient documentation

## 2021-01-09 DIAGNOSIS — K5730 Diverticulosis of large intestine without perforation or abscess without bleeding: Secondary | ICD-10-CM | POA: Diagnosis not present

## 2021-01-09 DIAGNOSIS — K449 Diaphragmatic hernia without obstruction or gangrene: Secondary | ICD-10-CM | POA: Diagnosis not present

## 2021-01-09 DIAGNOSIS — I6523 Occlusion and stenosis of bilateral carotid arteries: Secondary | ICD-10-CM | POA: Diagnosis not present

## 2021-01-09 DIAGNOSIS — C8330 Diffuse large B-cell lymphoma, unspecified site: Secondary | ICD-10-CM | POA: Diagnosis not present

## 2021-01-09 LAB — GLUCOSE, CAPILLARY: Glucose-Capillary: 101 mg/dL — ABNORMAL HIGH (ref 70–99)

## 2021-01-09 MED ORDER — FLUDEOXYGLUCOSE F - 18 (FDG) INJECTION
8.1500 | Freq: Once | INTRAVENOUS | Status: AC
  Administered 2021-01-09: 8.11 via INTRAVENOUS

## 2021-01-15 NOTE — Progress Notes (Signed)
Ryan Ball  [address] [place],  [place]  [address] [phone]  Clinic Day:  01/22/2021  Referring physician: Angelina Sheriff, MD   This document serves as a record of services personally performed by Ryan Poisson, MD. It was created on their behalf by Ryan Ball, a trained medical scribe. The creation of this record is based on the scribe's personal observations and the provider's statements to them.  CHIEF COMPLAINT:  CC: History of stage IIA large B-cell lymphoma  Current Treatment:  Surveillance  HISTORY OF PRESENT ILLNESS:  Ryan Ball is a 87 y.o. male with stage IIA diffuse large B-cell lymphoma diagnosed in June 2021.  He presented with voice changes and dysphagia.  CT imaging revealed 2.4 x 3.9 x 4.8 cm posterior tongue base mass, as well as mild bilateral cervical lymphadenopathy.  He saw Dr. Conley Canal and underwent biopsy, which revealed diffuse large B-cell lymphoma, non-germinal center phenotype, which was CD45, CD19, CD20 and CD38 positive.  PET-CT revealed hypermetabolic tongue base mass measuring 4.8 x 2.1 cm with an SUV of 15.5 with bilateral cervical lymph nodes measuring 1.5 cm with an SUV max of 11.3.  He was seen by Dr. Cassell Clement at [facility].  She recommended starting with R-mini-CHOP given every 3 weeks, increasing the doses of chemotherapy with each cycle as tolerated.  Prior to chemotherapy he had an echocardiogram, which revealed mild concentric left ventricular hypertrophy, with mild global hypokinesis of the left ventricle and overall mildly impaired left ventricular systolic function with an ejection fraction of 45-50%.  He had his 1st cycle of R-mini-CHOP, with the chemotherapy given at 50% dose reduction, on July 12th. He received his second cycle at 75% dose and a third cycle at 90%. PET imaging from September 2021 revealed no evidence of metabolically active lymphoma within the neck, chest,  abdomen or pelvis.  The previously demonstrated mass at the base of the tongue has resolved.  He was referred for involved field radiation for consolidation.  PET imaging from February 2022 revealed a stable exam with no evidence for metabolically active lymphoma in the neck, chest, abdomen, or pelvis.  He had a skin cancer removed at Dr. Jimmye Norman office, and underwent Mohs surgery of the right lower leg and is caring for the wound accordingly.  INTERVAL HISTORY:  [name] is here for routine follow up and to review recent imaging results. PET imaging from September 2nd revealed a stable exam, with a single 23m weakly hypermetabolic left level 1B lymph node, likely inflammatory but attention on future studies is suggested. No recurrent lymphadenopathy at the base of the tongue and no findings for lymphadenopathy in the chest, abdomen or pelvis. Blood counts and chemistries are unremarkable except for a stable hemoglobin of 12.9. We will evaluate with iron studies, B12 and folate. He states that he is doing well. His  appetite is good, and he has gained 2 and 1/2 pounds since his last visit.  He denies fever, chills or other signs of infection.  He denies nausea, vomiting, bowel issues, or abdominal pain.  He denies sore throat, cough, dyspnea, or chest pain.  REVIEW OF SYSTEMS:  Review of Systems  Constitutional: Negative.  Negative for appetite change, chills, fatigue, fever and unexpected weight change.  HENT:   Positive for hearing loss.   Eyes: Negative.   Respiratory: Negative.  Negative for chest tightness, cough, hemoptysis, shortness of breath and wheezing.   Cardiovascular: Negative.  Negative for  chest pain, leg swelling and palpitations.  Gastrointestinal: Negative.  Negative for abdominal distention, abdominal pain, blood in stool, constipation, diarrhea, nausea and vomiting.  Endocrine: Negative.   Genitourinary: Negative.  Negative for difficulty urinating, dysuria, frequency and  hematuria.   Musculoskeletal: Negative.  Negative for arthralgias, back pain, flank pain, gait problem and myalgias.  Skin: Negative.   Neurological: Negative.  Negative for dizziness, extremity weakness, gait problem, headaches, light-headedness, numbness, seizures and speech difficulty.  Hematological: Negative.   Psychiatric/Behavioral: Negative.  Negative for depression and sleep disturbance. The patient is not nervous/anxious.   All other systems reviewed and are negative.   VITALS:  Blood pressure (!) 173/75, pulse 61, resp. rate 18, height _0  (1.753 m), weight 165 lb 12.8 oz (75.2 kg), SpO2 96 %.  Wt Readings from Last 3 Encounters:  01/22/21 165 lb 9.6 oz (75.1 kg)  01/22/21 165 lb 12.8 oz (75.2 kg)  10/09/20 163 lb 3.2 oz (74 kg)    Body mass index is 24.48 kg/m.  Performance status (ECOG): 0 - Asymptomatic  PHYSICAL EXAM:  Physical Exam Constitutional:      General: He is not in acute distress.    Appearance: Normal appearance. He is normal weight.  HENT:     Head: Normocephalic and atraumatic.  Eyes:     General: No scleral icterus.    Extraocular Movements: Extraocular movements intact.     Conjunctiva/sclera: Conjunctivae normal.     Pupils: Pupils are equal, round, and reactive to light.  Cardiovascular:     Rate and Rhythm: Normal rate and regular rhythm.     Pulses: Normal pulses.     Heart sounds: Normal heart sounds. No murmur heard.   No friction rub. No gallop.  Pulmonary:     Effort: Pulmonary effort is normal. No respiratory distress.     Breath sounds: Normal breath sounds.  Abdominal:     General: Bowel sounds are normal. There is no distension.     Palpations: Abdomen is soft. There is no hepatomegaly, splenomegaly or mass.     Tenderness: There is no abdominal tenderness.  Musculoskeletal:        General: Normal range of motion.     Cervical back: Normal range of motion and neck supple.     Right lower leg: No edema.     Left lower leg: No  edema.  Lymphadenopathy:     Cervical: No cervical adenopathy.  Skin:    General: Skin is warm and dry.  Neurological:     General: No focal deficit present.     Mental Status: He is alert and oriented to person, place, and time. Mental status is at baseline.  Psychiatric:        Mood and Affect: Mood normal.        Behavior: Behavior normal.        Thought Content: Thought content normal.        Judgment: Judgment normal.    LABS:   CBC Latest Ref Rng & Units 01/19/2021 10/09/2020 07/09/2020  WBC - 6.3 5.0 5.4  Hemoglobin 13.5 - 17.5 12.9(A) 12.9(A) 12.8(A)  Hematocrit 41 - 53 41 39(A) 39(A)  Platelets 150 - 399 186 151 172   CMP Latest Ref Rng & Units 01/19/2021 10/09/2020 07/09/2020  BUN 4 - _1 Creatinine 0.6 - 1.3 1.0 0.8 0.9  Sodium 137 - 147 139 140 139  Potassium 3.4 - 5.3 4.2 4.3 4.0  Chloride 99 - 108  103 104 105  CO2 13 - 22 27(A) 29(A) 28(A)  Calcium 8.7 - 10.7 9.5 9.0 9.3  Alkaline Phos 25 - 125 72 65 65  AST 14 - 40 _0 ALT 10 - 40 _1 Lab Results  Component Value Date   LDH 110 10/09/2020   LDH 113 04/09/2020     STUDIES:   EXAM: 01/09/2021 NUCLEAR MEDICINE PET SKULL BASE TO THIGH   TECHNIQUE: 8.11 mCi F-18 FDG was injected intravenously. Full-ring PET imaging was performed from the skull base to thigh after the radiotracer. CT data was obtained and used for attenuation correction and anatomic localization.   Fasting blood glucose: 101 mg/dl   COMPARISON:  PET-CT 02/07/2020 and 07/05/2020   FINDINGS: Mediastinal blood pool activity: SUV max 1.95   Liver activity: SUV max 3.27   NECK: No recurrent adenopathy at the base of the tongue. There is a stable appearing left level 1 B lymph node on image 25/4 which measures 9 mm. Mild hypermetabolism with SUV max of 3.73. This is likely inflammatory but attention on future studies is suggested. No other enlarged or hypermetabolic neck nodes.   Incidental CT findings: Bilateral  carotid artery calcifications are stable.   CHEST: No hypermetabolic mediastinal or hilar nodes. No suspicious pulmonary nodules on the CT scan.   Incidental CT findings: Stable vascular calcifications and large hiatal hernia.   ABDOMEN/PELVIS: No abnormal hypermetabolic activity within the liver, pancreas, adrenal glands, or spleen. No hypermetabolic lymph nodes in the abdomen or pelvis.   Incidental CT findings: Stable vascular calcifications. Stable severe sigmoid colon diverticulosis.   SKELETON: No focal hypermetabolic activity to suggest skeletal metastasis.   Incidental CT findings: none   IMPRESSION: 1. Single weakly hypermetabolic left level 1B lymph node, likely inflammatory but attention on future studies is suggested. 2. No recurrent lymphadenopathy at the base of the tongue and no findings for lymphadenopathy in the chest, abdomen or pelvis.  Allergies: No Known Allergies  Current Medications: Current Outpatient Medications  Medication Sig Dispense Refill   alendronate (FOSAMAX) 10 MG tablet Take 10 mg by mouth daily.     Cyanocobalamin 1000 MCG/ML KIT Inject 1,000 mcg as directed every 30 (thirty) days. Every 4 weeks     doxepin (SINEQUAN) 25 MG capsule Take 1 capsule by mouth at bedtime as needed (Sleeping).     rosuvastatin (CRESTOR) 5 MG tablet Take 1 tablet by mouth every other day.      Vitamin D, Ergocalciferol, (DRISDOL) 1.25 MG (50000 UNIT) CAPS capsule Take 50,000 Units by mouth every 7 (seven) days.     No current facility-administered medications for this visit.     ASSESSMENT & PLAN:   Assessment:   1. Stage II diffuse large B-cell lymphoma.  He tolerated treatment with R-mini-CHOP without significant difficulty, and completed 3 cycles at the end of August.  PET imaging has revealed remission with no evidence of metabolically active lymphoma.  The mass at the tongue base has resolved.  He completed consolidation radiation in November 2021.  PET  imaging from September remains stable with no evidence of residual or recurrent disease. There is a stable single 9 mm, weakly hypermetabolic left level 1B lymph node, likely inflammatory but attention on future studies is suggested.  2. Mildly impaired left ventricular systolic function with an EF between 45-50%. He continues to follow up with Dr. Agustin Cree, his cardiologist.   3. Anemia, stable, likely secondary to prior chemotherapy. We  will evaluate further with iron studies, B12 and folate.  Plan: He continues to remain without evidence of disease. There is a single 9 mm weakly hypermetabolic left level 1B lymph node, likely inflammatory but attention on future studies is suggested. We will repeat PET imaging again in 6 months. I reviewed these results with the patient and his daughter and answered their questions. We will plan to see him back in 3 months with CBC, CMP, LDH and port flush for repeat evaluation. He verbalizes understanding of and agreement to the plans discussed today. He knows to call the office should any new questions or concerns arise.   I provided 20 minutes of face-to-face time during this this encounter and > 50% was spent counseling as documented under my assessment and plan.    [name], MD [facility] [address] [place] [address] Dept: [phone] Dept Fax: [phone]   I, Rita Ohara, am acting as scribe for [name], MD  I have reviewed this report as typed by the medical scribe, and it is complete and accurate.

## 2021-01-19 DIAGNOSIS — R202 Paresthesia of skin: Secondary | ICD-10-CM | POA: Diagnosis not present

## 2021-01-19 DIAGNOSIS — Z6827 Body mass index (BMI) 27.0-27.9, adult: Secondary | ICD-10-CM | POA: Diagnosis not present

## 2021-01-19 DIAGNOSIS — R079 Chest pain, unspecified: Secondary | ICD-10-CM | POA: Diagnosis not present

## 2021-01-19 DIAGNOSIS — R519 Headache, unspecified: Secondary | ICD-10-CM | POA: Diagnosis not present

## 2021-01-19 LAB — HEPATIC FUNCTION PANEL
ALT: 16 (ref 10–40)
AST: 30 (ref 14–40)
Alkaline Phosphatase: 72 (ref 25–125)
Bilirubin, Total: 0.6

## 2021-01-19 LAB — COMPREHENSIVE METABOLIC PANEL
Albumin: 4.1 (ref 3.5–5.0)
Calcium: 9.5 (ref 8.7–10.7)

## 2021-01-19 LAB — CBC: RBC: 4.57 (ref 3.87–5.11)

## 2021-01-19 LAB — BASIC METABOLIC PANEL
BUN: 14 (ref 4–21)
CO2: 27 — AB (ref 13–22)
Chloride: 103 (ref 99–108)
Creatinine: 1 (ref 0.6–1.3)
Glucose: 109
Potassium: 4.2 (ref 3.4–5.3)
Sodium: 139 (ref 137–147)

## 2021-01-19 LAB — CBC AND DIFFERENTIAL
HCT: 41 (ref 41–53)
Hemoglobin: 12.9 — AB (ref 13.5–17.5)
Neutrophils Absolute: 4.47
Platelets: 186 (ref 150–399)
WBC: 6.3

## 2021-01-20 ENCOUNTER — Other Ambulatory Visit: Payer: Medicare Other

## 2021-01-22 ENCOUNTER — Other Ambulatory Visit: Payer: Self-pay | Admitting: Oncology

## 2021-01-22 ENCOUNTER — Other Ambulatory Visit: Payer: Self-pay | Admitting: Hematology and Oncology

## 2021-01-22 ENCOUNTER — Ambulatory Visit (INDEPENDENT_AMBULATORY_CARE_PROVIDER_SITE_OTHER): Payer: Medicare Other | Admitting: Cardiology

## 2021-01-22 ENCOUNTER — Inpatient Hospital Stay: Payer: Medicare Other | Attending: Oncology | Admitting: Oncology

## 2021-01-22 ENCOUNTER — Encounter: Payer: Self-pay | Admitting: Oncology

## 2021-01-22 ENCOUNTER — Encounter: Payer: Self-pay | Admitting: Cardiology

## 2021-01-22 ENCOUNTER — Other Ambulatory Visit: Payer: Self-pay

## 2021-01-22 ENCOUNTER — Inpatient Hospital Stay: Payer: Medicare Other

## 2021-01-22 ENCOUNTER — Telehealth: Payer: Self-pay | Admitting: Oncology

## 2021-01-22 VITALS — BP 156/76 | HR 68 | Ht 69.0 in | Wt 165.6 lb

## 2021-01-22 VITALS — BP 173/75 | HR 61 | Resp 18 | Ht 69.0 in | Wt 165.8 lb

## 2021-01-22 DIAGNOSIS — D649 Anemia, unspecified: Secondary | ICD-10-CM | POA: Diagnosis not present

## 2021-01-22 DIAGNOSIS — D6481 Anemia due to antineoplastic chemotherapy: Secondary | ICD-10-CM | POA: Diagnosis not present

## 2021-01-22 DIAGNOSIS — C8331 Diffuse large B-cell lymphoma, lymph nodes of head, face, and neck: Secondary | ICD-10-CM

## 2021-01-22 DIAGNOSIS — I420 Dilated cardiomyopathy: Secondary | ICD-10-CM | POA: Diagnosis not present

## 2021-01-22 DIAGNOSIS — I350 Nonrheumatic aortic (valve) stenosis: Secondary | ICD-10-CM

## 2021-01-22 DIAGNOSIS — Z9221 Personal history of antineoplastic chemotherapy: Secondary | ICD-10-CM | POA: Insufficient documentation

## 2021-01-22 DIAGNOSIS — C8330 Diffuse large B-cell lymphoma, unspecified site: Secondary | ICD-10-CM | POA: Insufficient documentation

## 2021-01-22 DIAGNOSIS — D539 Nutritional anemia, unspecified: Secondary | ICD-10-CM

## 2021-01-22 DIAGNOSIS — R079 Chest pain, unspecified: Secondary | ICD-10-CM

## 2021-01-22 DIAGNOSIS — R0789 Other chest pain: Secondary | ICD-10-CM

## 2021-01-22 DIAGNOSIS — E7800 Pure hypercholesterolemia, unspecified: Secondary | ICD-10-CM

## 2021-01-22 DIAGNOSIS — Z452 Encounter for adjustment and management of vascular access device: Secondary | ICD-10-CM | POA: Diagnosis not present

## 2021-01-22 LAB — IRON AND TIBC
Iron: 114 ug/dL (ref 45–182)
Saturation Ratios: 34 % (ref 17.9–39.5)
TIBC: 334 ug/dL (ref 250–450)
UIBC: 220 ug/dL

## 2021-01-22 LAB — FOLATE: Folate: 8.5 ng/mL (ref >5.9)

## 2021-01-22 LAB — VITAMIN B12: Vitamin B-12: 274 pg/mL (ref 180–914)

## 2021-01-22 LAB — FERRITIN: Ferritin: 48 ng/mL (ref 24–336)

## 2021-01-22 MED ORDER — SODIUM CHLORIDE 0.9% FLUSH
10.0000 mL | INTRAVENOUS | Status: DC | PRN
  Administered 2021-01-22: 10 mL

## 2021-01-22 MED ORDER — HEPARIN SOD (PORK) LOCK FLUSH 100 UNIT/ML IV SOLN
500.0000 [IU] | Freq: Once | INTRAVENOUS | Status: AC | PRN
  Administered 2021-01-22: 500 [IU]

## 2021-01-22 MED ORDER — ASPIRIN EC 81 MG PO TBEC: 81.0000 mg | DELAYED_RELEASE_TABLET | Freq: Every day | ORAL | 3 refills | Status: DC

## 2021-01-22 MED ORDER — NITROGLYCERIN 0.4 MG SL SUBL: 0.4000 mg | SUBLINGUAL_TABLET | SUBLINGUAL | 3 refills | Status: DC | PRN

## 2021-01-22 NOTE — Telephone Encounter (Signed)
Per 9/15 los next appt scheduled and given to patient

## 2021-01-22 NOTE — Progress Notes (Signed)
Cardiology Office Note:    Date:  01/22/2021   ID:  Ryan Ball, DOB 11/10/1930, MRN [id number]  PCP:  Angelina Sheriff, MD  Cardiologist:  Jenne Campus, MD    Referring MD: Angelina Sheriff, MD   Chief Complaint  Patient presents with   Chest Pain    History of Present Illness:    Ryan Ball is a 87 y.o. 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  He came today to my office because of episode of chest pain.  He said it happened on and off sometimes when he bent forward in the shower to pick up the soap he will feel it he laughed to walk on the regular basis that he did not get this sensation while walking but after admit to getting good story from him is somewhat difficult he is hard of hearing.  He did see his primary care physician on Monday he did have troponin I done which showed no evidence of myocardial injury.  Past Medical History:  Diagnosis Date   Anemia due to antineoplastic chemotherapy 04/13/2020   Atypical chest pain 10/10/2018   Cardiomegaly 06/08/2016   Diffuse large B-cell lymphoma ([place]) 10/30/2019   Dilated cardiomyopathy ([place]) ejection fraction 45% in summer 2021 by echo 05/20/2020   Encounter for venous access device care 04/09/2020   Esophageal reflux 10/10/2018   Hypercholesteremia 10/10/2018   Postoperative examination 12/12/2019   Senile nuclear sclerosis 05/19/2012   Senile nuclear sclerosis, bilateral 11/02/2019   Sleep disturbance 10/10/2018   SVT (supraventricular tachycardia) ([place]) 09/20/2019    Past Surgical History:  Procedure Laterality Date   CHOLECYSTECTOMY      Current Medications: Current Meds  Medication Sig   alendronate (FOSAMAX) 10 MG tablet Take 10 mg by mouth daily.    Cyanocobalamin 1000 MCG/ML KIT Inject 1,000 mcg as directed every 30 (thirty) days. Every 4 weeks   doxepin (SINEQUAN) 25 MG capsule Take 1 capsule by mouth at bedtime as needed (Sleeping).   rosuvastatin (CRESTOR) 5 MG tablet Take 1 tablet by mouth every other day.    Vitamin D, Ergocalciferol, (DRISDOL) 1.25 MG (50000 UNIT) CAPS capsule Take 50,000 Units by mouth every 7 (seven) days.     Allergies:   Patient has no known allergies.   Social History   Socioeconomic History   Marital status: Widowed    Spouse name: Not on file   Number of children: Not on file   Years of education: Not on file   Highest education level: Not on file  Occupational History   Not on file  Tobacco Use   Smoking status: Never   Smokeless tobacco: Never  Substance and Sexual Activity   Alcohol use: Yes    Alcohol/week: 6.0 standard drinks    Types: 6 Glasses of wine per week    Comment: 1-2 glasses of wine 2-3 times a week   Drug use: Never   Sexual activity: Not on file  Other Topics Concern   Not on file  Social History Narrative   Not on file   Social Determinants of Health   Financial Resource Strain: Not on file  Food Insecurity: Not on file  Transportation Needs: Not on file  Physical Activity: Not on file  Stress: Not  on file  Social Connections: Not on file     Family History: The patient's family history includes CAD in his brother and father. ROS:   Please see the history of present illness.    All 14 point review of systems negative except as described per history of present illness  EKGs/Labs/Other Studies Reviewed:      Recent Labs: 01/19/2021: ALT 16; BUN 14; Creatinine 1.0; Hemoglobin 12.9; Platelets 186; Potassium 4.2; Sodium 139  Recent Lipid Panel No results found for: CHOL, TRIG, HDL, CHOLHDL, VLDL, LDLCALC, LDLDIRECT  Physical Exam:    VS:  BP (!) 156/76 (BP Location: Left Arm, Patient Position: Sitting)   Pulse 68   Ht 5' 9" (1.753 m)   Wt 165 lb 9.6 oz  (75.1 kg)   SpO2 98%   BMI 24.45 kg/m     Wt Readings from Last 3 Encounters:  01/22/21 165 lb 9.6 oz (75.1 kg)  01/22/21 165 lb 12.8 oz (75.2 kg)  10/09/20 163 lb 3.2 oz (74 kg)     GEN:  Well nourished, well developed in no acute distress HEENT: Normal NECK: No JVD; No carotid bruits LYMPHATICS: No lymphadenopathy CARDIAC: RRR, no murmurs, no rubs, no gallops RESPIRATORY:  Clear to auscultation without rales, wheezing or rhonchi  ABDOMEN: Soft, non-tender, non-distended MUSCULOSKELETAL:  No edema; No deformity  SKIN: Warm and dry LOWER EXTREMITIES: no swelling NEUROLOGIC:  Alert and oriented x 3 PSYCHIATRIC:  Normal affect   ASSESSMENT:    1. Atypical chest pain   2. Hypercholesteremia   3. Nonrheumatic aortic valve stenosis   4. Dilated cardiomyopathy ([place]) ejection fraction 45% in summer 2021 by echo    PLAN:    In order of problems listed above:  Chest pain atypical but concerning asked him to start taking 1 baby aspirin every single day.  I will give him prescription for nitroglycerin.  He will be scheduled to have [place].  I will do troponin I today.  Troponin I from Monday was negative.  Dyslipidemia: He is on Crestor 5 mg daily doing well from that point review continue present management Nonrheumatic aortic valve stenosis only mild.  Continue watching History of dilated cardiomyopathy latest echocardiogram showed preserved ejection fraction.   Medication Adjustments/Labs and Tests Ordered: Current medicines are reviewed at length with the patient today.  Concerns regarding medicines are outlined above.  No orders of the defined types were placed in this encounter.  Medication changes: No orders of the defined types were placed in this encounter.   Signed, Park Liter, MD, [facility] 01/22/2021 12:19 PM    [place]

## 2021-01-22 NOTE — Patient Instructions (Signed)
Medication Instructions:  Your physician has recommended you make the following change in your medication:  START: Aspirin 81 mg once daily START: Nitroglycerin 0.4 mg take one tablet by mouth every 5 minutes up to three times as needed for chest pain.  *If you need a refill on your cardiac medications before your next appointment, please call your pharmacy*   Lab Work: None If you have labs (blood work) drawn today and your tests are completely normal, you will receive your results only by: [place] (if you have MyChart) OR A paper copy in the mail If you have any lab test that is abnormal or we need to change your treatment, we will call you to review the results.   Testing/Procedures: Your physician has requested that you have a lexiscan myoview. For further information please visit [email or web address]. Please follow instruction sheet, as given.   The test will take approximately 3 to 4 hours to complete; you may bring reading material.  If someone comes with you to your appointment, they will need to remain in the main lobby due to limited space in the testing area. **If you are pregnant or breastfeeding, please notify the nuclear lab prior to your appointment**  How to prepare for your Myocardial Perfusion Test: Do not eat or drink 3 hours prior to your test, except you may have water. Do not consume products containing caffeine (regular or decaffeinated) 12 hours prior to your test. (ex: coffee, chocolate, sodas, tea). Do bring a list of your current medications with you.  If not listed below, you may take your medications as normal. Do wear comfortable clothes (no dresses or overalls) and walking shoes, tennis shoes preferred (No heels or open toe shoes are allowed). Do NOT wear cologne, perfume, aftershave, or lotions (deodorant is allowed). If these instructions are not followed, your test will have to be rescheduled.    Follow-Up: At [facility], you and your  health needs are our priority.  As part of our continuing mission to provide you with exceptional heart care, we have created designated Provider Care Teams.  These Care Teams include your primary Cardiologist (physician) and Advanced Practice Providers (APPs -  Physician Assistants and Nurse Practitioners) who all work together to provide you with the care you need, when you need it.  We recommend signing up for the patient portal called "MyChart".  Sign up information is provided on this After Visit Summary.  MyChart is used to connect with patients for Virtual Visits (Telemedicine).  Patients are able to view lab/test results, encounter notes, upcoming appointments, etc.  Non-urgent messages can be sent to your provider as well.   To learn more about what you can do with MyChart, go to [email or web address].    Your next appointment:   2 month(s)  The format for your next appointment:   In Person  Provider:   Jenne Campus, MD   Other Instructions

## 2021-01-22 NOTE — Addendum Note (Signed)
Addended by: Orvan July on: 01/22/2021 12:26 PM   Modules accepted: Orders

## 2021-01-23 ENCOUNTER — Other Ambulatory Visit: Payer: Self-pay

## 2021-01-26 ENCOUNTER — Telehealth (HOSPITAL_COMMUNITY): Payer: Self-pay | Admitting: *Deleted

## 2021-01-26 ENCOUNTER — Encounter: Payer: Self-pay | Admitting: Oncology

## 2021-01-26 ENCOUNTER — Telehealth: Payer: Self-pay

## 2021-01-26 NOTE — Telephone Encounter (Signed)
Left message on voicemail per DPR in reference to upcoming appointment scheduled on 01/28/21 at 7:45 with detailed instructions given per Myocardial Perfusion Study Information Sheet for the test. LM to arrive 15 minutes early, and that it is imperative to arrive on time for appointment to keep from having the test rescheduled. If you need to cancel or reschedule your appointment, please call the office within 24 hours of your appointment. Failure to do so may result in a cancellation of your appointment, and a $50 no show fee. Phone number given for call back for any questions.

## 2021-01-26 NOTE — Telephone Encounter (Signed)
Daughter aware she voiced patient is getting B 12 injections from PCP , she will make them aware . She will also have him take B 12 by mouth. And she will update [place] on the injections and dose and frequency.

## 2021-01-26 NOTE — Telephone Encounter (Signed)
-----   Message from Belva Chimes, LPN sent at [id number]  1:52 PM EDT ----- Regarding: FW: call daughter  ----- Message ----- From: [name], MD Sent: 01/26/2021  10:34 AM EDT To: Belva Chimes, LPN Subject: call daughter                                  Tell daughter vitamin levels look good but B12 on the low side of normal.  I rec MVI with B12 or B12 complex 1 daily

## 2021-01-27 NOTE — Addendum Note (Signed)
Addended by: Jenne Campus on: 01/27/2021 11:47 AM   Modules accepted: Orders

## 2021-01-28 ENCOUNTER — Ambulatory Visit (INDEPENDENT_AMBULATORY_CARE_PROVIDER_SITE_OTHER): Payer: Medicare Other

## 2021-01-28 ENCOUNTER — Other Ambulatory Visit: Payer: Self-pay

## 2021-01-28 DIAGNOSIS — R079 Chest pain, unspecified: Secondary | ICD-10-CM

## 2021-01-28 DIAGNOSIS — R0789 Other chest pain: Secondary | ICD-10-CM

## 2021-01-28 LAB — MYOCARDIAL PERFUSION IMAGING
Base ST Depression (mm): 0 mm
LV dias vol: 90 mL (ref 62–150)
LV sys vol: 32 mL
Nuc Stress EF: 65 %
Peak HR: 85 {beats}/min
Rest HR: 68 {beats}/min
Rest Nuclear Isotope Dose: 10.9 mCi
SDS: 1
SRS: 1
SSS: 2
ST Depression (mm): 0 mm
Stress Nuclear Isotope Dose: 32.3 mCi
TID: 0.95

## 2021-01-28 MED ORDER — TECHNETIUM TC 99M TETROFOSMIN IV KIT
10.9000 | PACK | Freq: Once | INTRAVENOUS | Status: AC | PRN
  Administered 2021-01-28: 10.9 via INTRAVENOUS

## 2021-01-28 MED ORDER — REGADENOSON 0.4 MG/5ML IV SOLN
0.4000 mg | Freq: Once | INTRAVENOUS | Status: AC
  Administered 2021-01-28: 0.4 mg via INTRAVENOUS

## 2021-01-28 MED ORDER — TECHNETIUM TC 99M TETROFOSMIN IV KIT
32.3000 | PACK | Freq: Once | INTRAVENOUS | Status: AC | PRN
  Administered 2021-01-28: 32.3 via INTRAVENOUS

## 2021-01-29 ENCOUNTER — Telehealth: Payer: Self-pay

## 2021-01-29 NOTE — Telephone Encounter (Signed)
Left message on patients voicemail to please return our call.   

## 2021-01-29 NOTE — Telephone Encounter (Signed)
-----   Message from Park Liter, MD sent at 01/29/2021  1:30 PM EDT ----- Stress test showing very small apical ischemia.  He did have a follow-up but he does not have to be quick within the next few weeks

## 2021-01-30 ENCOUNTER — Telehealth: Payer: Self-pay

## 2021-01-30 NOTE — Telephone Encounter (Signed)
Spoke with patient regarding results and recommendation.  Patient verbalizes understanding and is agreeable to plan of care. Advised patient to call back with any issues or concerns.  

## 2021-01-30 NOTE — Telephone Encounter (Signed)
Left message on patients voicemail to please return our call.   

## 2021-01-30 NOTE — Telephone Encounter (Signed)
-----   Message from Park Liter, MD sent at 01/29/2021  1:30 PM EDT ----- Stress test showing very small apical ischemia.  He did have a follow-up but he does not have to be quick within the next few weeks

## 2021-01-30 NOTE — Telephone Encounter (Signed)
Pt is returning call.  

## 2021-02-02 DIAGNOSIS — D510 Vitamin B12 deficiency anemia due to intrinsic factor deficiency: Secondary | ICD-10-CM | POA: Diagnosis not present

## 2021-02-23 DIAGNOSIS — Z23 Encounter for immunization: Secondary | ICD-10-CM | POA: Diagnosis not present

## 2021-03-05 ENCOUNTER — Ambulatory Visit: Payer: Medicare Other | Admitting: Cardiology

## 2021-03-06 ENCOUNTER — Other Ambulatory Visit: Payer: Self-pay

## 2021-03-06 ENCOUNTER — Ambulatory Visit (INDEPENDENT_AMBULATORY_CARE_PROVIDER_SITE_OTHER): Payer: Medicare Other | Admitting: Cardiology

## 2021-03-06 VITALS — BP 142/74 | HR 83 | Ht 69.0 in | Wt 170.2 lb

## 2021-03-06 DIAGNOSIS — I25118 Atherosclerotic heart disease of native coronary artery with other forms of angina pectoris: Secondary | ICD-10-CM | POA: Diagnosis not present

## 2021-03-06 DIAGNOSIS — I350 Nonrheumatic aortic (valve) stenosis: Secondary | ICD-10-CM

## 2021-03-06 DIAGNOSIS — C8331 Diffuse large B-cell lymphoma, lymph nodes of head, face, and neck: Secondary | ICD-10-CM

## 2021-03-06 DIAGNOSIS — I420 Dilated cardiomyopathy: Secondary | ICD-10-CM

## 2021-03-06 DIAGNOSIS — I2510 Atherosclerotic heart disease of native coronary artery without angina pectoris: Secondary | ICD-10-CM | POA: Insufficient documentation

## 2021-03-06 NOTE — Patient Instructions (Signed)
Medication Instructions:  Your physician recommends that you continue on your current medications as directed. Please refer to the Current Medication list given to you today.  *If you need a refill on your cardiac medications before your next appointment, please call your pharmacy*   Lab Work: NONE If you have labs (blood work) drawn today and your tests are completely normal, you will receive your results only by: [place] (if you have MyChart) OR A paper copy in the mail If you have any lab test that is abnormal or we need to change your treatment, we will call you to review the results.   Testing/Procedures: NONE   Follow-Up: At [facility], you and your health needs are our priority.  As part of our continuing mission to provide you with exceptional heart care, we have created designated Provider Care Teams.  These Care Teams include your primary Cardiologist (physician) and Advanced Practice Providers (APPs -  Physician Assistants and Nurse Practitioners) who all work together to provide you with the care you need, when you need it.  We recommend signing up for the patient portal called "MyChart".  Sign up information is provided on this After Visit Summary.  MyChart is used to connect with patients for Virtual Visits (Telemedicine).  Patients are able to view lab/test results, encounter notes, upcoming appointments, etc.  Non-urgent messages can be sent to your provider as well.   To learn more about what you can do with MyChart, go to [email or web address].    Your next appointment:   2 Your physician has requested that you have an echocardiogram. Echocardiography is a painless test that uses sound waves to create images of your heart. It provides your doctor with information about the size and shape of your heart and how well your heart's chambers and valves are working. This procedure takes approximately one hour. There are no restrictions for this procedure.   The  format for your next appointment:   In Person  Provider:   Jenne Campus, MD   Other Instructions

## 2021-03-06 NOTE — Progress Notes (Signed)
Cardiology Office Note:    Date:  03/06/2021   ID:  Ryan Ball, DOB April 01, 1931, MRN [id number]  PCP:  Angelina Sheriff, MD  Cardiologist:  Jenne Campus, MD    Referring MD: Angelina Sheriff, MD   Chief Complaint  Patient presents with   Chest Pain    History of Present Illness:    Ryan Ball is a 87 y.o. male with past medical history significant for diffuse large B-cell lymphoma, history of cardiomyopathy with ejection fraction 45% in summer 2021 by echocardiogram however improvement to normalization based on echo done in December of this year.  Last time I did see him he came to me because of some chest pain which have very atypical characteristic pain happen at rest not related to exercise.  He like to walk on the regular basis he never got any chest pain while walking regardless, stress test being done stress test showed small area of ischemia involving apex he comes today to talk about it.  Past Medical History:  Diagnosis Date   Anemia due to antineoplastic chemotherapy 04/13/2020   Atypical chest pain 10/10/2018   Cardiomegaly 06/08/2016   Diffuse large B-cell lymphoma ([place]) 10/30/2019   Dilated cardiomyopathy ([place]) ejection fraction 45% in summer 2021 by echo 05/20/2020   Encounter for venous access device care 04/09/2020   Esophageal reflux 10/10/2018   Hypercholesteremia 10/10/2018   Postoperative examination 12/12/2019   Senile nuclear sclerosis 05/19/2012   Senile nuclear sclerosis, bilateral 11/02/2019   Sleep disturbance 10/10/2018   SVT (supraventricular tachycardia) ([place]) 09/20/2019    Past Surgical History:  Procedure Laterality Date   CHOLECYSTECTOMY      Current Medications: Current Meds  Medication Sig   alendronate (FOSAMAX) 10 MG tablet Take 10 mg by mouth every other day.   aspirin EC 81 MG tablet Take 1 tablet (81 mg total) by mouth daily. Swallow whole.   Cyanocobalamin 1000 MCG/ML KIT Inject 1,000 mcg as directed every 30  (thirty) days. Every 4 weeks   doxepin (SINEQUAN) 25 MG capsule Take 1 capsule by mouth at bedtime as needed (Sleeping).   nitroGLYCERIN (NITROSTAT) 0.4 MG SL tablet Place 1 tablet (0.4 mg total) under the tongue every 5 (five) minutes as needed for chest pain.   rosuvastatin (CRESTOR) 5 MG tablet Take 1 tablet by mouth every other day.    Vitamin D, Ergocalciferol, (DRISDOL) 1.25 MG (50000 UNIT) CAPS capsule Take 50,000 Units by mouth every 14 (fourteen) days.     Allergies:   Patient has no known allergies.   Social History   Socioeconomic History   Marital status: Widowed    Spouse name: Not on file   Number of children: Not on file   Years of education: Not on file   Highest education level: Not on file  Occupational History   Not on file  Tobacco Use   Smoking status: Never   Smokeless tobacco: Never  Substance and Sexual Activity   Alcohol use: Yes    Alcohol/week: 6.0 standard drinks    Types: 6 Glasses of wine per week    Comment: 1-2 glasses of wine 2-3 times a week   Drug use: Never   Sexual activity: Not on file  Other Topics Concern   Not on file  Social History Narrative   Not on file   Social Determinants of Health   Financial Resource Strain: Not on file  Food Insecurity: Not on file  Transportation Needs: Not on  file  Physical Activity: Not on file  Stress: Not on file  Social Connections: Not on file     Family History: The patient's family history includes CAD in his brother and father. ROS:   Please see the history of present illness.    All 14 point review of systems negative except as described per history of present illness  EKGs/Labs/Other Studies Reviewed:      Recent Labs: 01/19/2021: ALT 16; BUN 14; Creatinine 1.0; Hemoglobin 12.9; Platelets 186; Potassium 4.2; Sodium 139  Recent Lipid Panel No results found for: CHOL, TRIG, HDL, CHOLHDL, VLDL, LDLCALC, LDLDIRECT  Physical Exam:    VS:  BP (!) 142/74 (BP Location: Left Arm, Patient  Position: Sitting)   Pulse 83   Ht 5' 9"  (1.753 m)   Wt 170 lb 3.2 oz (77.2 kg)   SpO2 99%   BMI 25.13 kg/m     Wt Readings from Last 3 Encounters:  03/06/21 170 lb 3.2 oz (77.2 kg)  01/28/21 165 lb (74.8 kg)  01/22/21 165 lb 9.6 oz (75.1 kg)     GEN:  Well nourished, well developed in no acute distress HEENT: Normal NECK: No JVD; No carotid bruits LYMPHATICS: No lymphadenopathy CARDIAC: RRR, no murmurs, no rubs, no gallops RESPIRATORY:  Clear to auscultation without rales, wheezing or rhonchi  ABDOMEN: Soft, non-tender, non-distended MUSCULOSKELETAL:  No edema; No deformity  SKIN: Warm and dry LOWER EXTREMITIES: no swelling NEUROLOGIC:  Alert and oriented x 3 PSYCHIATRIC:  Normal affect   ASSESSMENT:    1. Dilated cardiomyopathy ([facility]) ejection fraction 45% in summer 2021 by echo   2. Nonrheumatic aortic valve stenosis   3. Coronary artery disease of native artery of native heart with stable angina pectoris ([place])   4. Diffuse large B-cell lymphoma of lymph nodes of neck ([facility])    PLAN:    In order of problems listed above:  Cardiomyopathy however last ejection fraction show normal ejection fraction. Nonrheumatic aortic valve stenosis, only mild, not contributing to his symptoms Coronary artery disease: He stress test showed small area of ischemia involving apex.  We discussed options today.  Option being medical therapy versus cardiac catheterization.  He clearly told me that he is too old to have anything done he does not want to have anything done.  He said he has been living happily for so many years that he does not want to do cardiac catheterization.  I offered him medications however he does not want to change anything because he feels good.  He said that he will try to exercise more and he never had any chest pain during the exercises.  He does have nitroglycerin and asked him to carry this with him and use it on as-needed basis.  Also asked him to let me know if he  start having symptoms that much worse than before. Diffuse large B-cell lymphoma followed by oncology.  Stable   Medication Adjustments/Labs and Tests Ordered: Current medicines are reviewed at length with the patient today.  Concerns regarding medicines are outlined above.  No orders of the defined types were placed in this encounter.  Medication changes: No orders of the defined types were placed in this encounter.   Signed, Park Liter, MD, [facility] 03/06/2021 1:23 PM    [place]

## 2021-03-09 NOTE — Addendum Note (Signed)
Addended by: Truddie Hidden on: 03/09/2021 07:47 AM   Modules accepted: Orders

## 2021-03-16 DIAGNOSIS — E559 Vitamin D deficiency, unspecified: Secondary | ICD-10-CM | POA: Diagnosis not present

## 2021-04-14 ENCOUNTER — Ambulatory Visit: Payer: Medicare Other | Admitting: Cardiology

## 2021-04-23 ENCOUNTER — Encounter: Payer: Self-pay | Admitting: Hematology and Oncology

## 2021-04-23 ENCOUNTER — Inpatient Hospital Stay: Payer: Medicare Other | Attending: Hematology and Oncology | Admitting: Hematology and Oncology

## 2021-04-23 ENCOUNTER — Inpatient Hospital Stay: Payer: Medicare Other

## 2021-04-23 VITALS — BP 154/79 | HR 71 | Temp 97.9°F | Resp 16 | Ht 69.0 in | Wt 171.4 lb

## 2021-04-23 DIAGNOSIS — C8331 Diffuse large B-cell lymphoma, lymph nodes of head, face, and neck: Secondary | ICD-10-CM

## 2021-04-23 DIAGNOSIS — Z8572 Personal history of non-Hodgkin lymphomas: Secondary | ICD-10-CM | POA: Diagnosis not present

## 2021-04-23 DIAGNOSIS — D6481 Anemia due to antineoplastic chemotherapy: Secondary | ICD-10-CM | POA: Diagnosis not present

## 2021-04-23 DIAGNOSIS — D649 Anemia, unspecified: Secondary | ICD-10-CM | POA: Diagnosis not present

## 2021-04-23 LAB — BASIC METABOLIC PANEL
BUN: 17 (ref 4–21)
CO2: 28 — AB (ref 13–22)
Chloride: 105 (ref 99–108)
Creatinine: 1 (ref 0.6–1.3)
Glucose: 79
Potassium: 4.4 (ref 3.4–5.3)
Sodium: 139 (ref 137–147)

## 2021-04-23 LAB — HEPATIC FUNCTION PANEL
ALT: 21 (ref 10–40)
AST: 31 (ref 14–40)
Alkaline Phosphatase: 65 (ref 25–125)
Bilirubin, Total: 0.6

## 2021-04-23 LAB — CBC: RBC: 4.35 (ref 3.87–5.11)

## 2021-04-23 LAB — LACTATE DEHYDROGENASE: LDH: 119 U/L (ref 98–192)

## 2021-04-23 LAB — CBC AND DIFFERENTIAL
HCT: 38 — AB (ref 41–53)
Hemoglobin: 12.7 — AB (ref 13.5–17.5)
Neutrophils Absolute: 3.84
Platelets: 178 (ref 150–399)
WBC: 5.9

## 2021-04-23 LAB — COMPREHENSIVE METABOLIC PANEL
Albumin: 3.8 (ref 3.5–5.0)
Calcium: 8.9 (ref 8.7–10.7)

## 2021-04-23 NOTE — Progress Notes (Addendum)
Patient Care Team: Angelina Sheriff, MD as PCP - General (Family Medicine) Park Liter, MD as PCP - Cardiology (Cardiology)  Clinic Day:  04/23/2021  Referring physician: Angelina Sheriff, MD  ASSESSMENT & PLAN:   Assessment & Plan: Diffuse large B-cell lymphoma ([place]) Stage II diffuse large B-cell lymphoma.  He tolerated treatment with R-mini-CHOP without significant difficulty, and completed 3 cycles at the end of August.  PET imaging has revealed remission with no evidence of metabolically active lymphoma.  The mass at the tongue base has resolved.  He completed consolidation radiation in November 2021.  PET imaging from September remains stable with no evidence of residual or recurrent disease. There is a stable single 9 mm, weakly hypermetabolic left level 1B lymph node, likely inflammatory but attention on future studies is suggested. He continues to do well without any signs of recurrence. He follows closely with cardiology. We will plan to repeat PET scan in 3 months and have him return to clinic for repeat evaluation in 3 months.    The patient understands the plans discussed today and is in agreement with them.  He knows to contact our office if he develops concerns prior to his next appointment.     Melodye Ped, NP  [facility] [address] [place] [address] Dept: [phone] Dept Fax: [phone]   Orders Placed This Encounter  Procedures   NM PET Image Restag (PS) Skull Base To Thigh    Standing Status:   Future    Standing Expiration Date:   04/23/2022    Order Specific Question:   If indicated for the ordered procedure, I authorize the administration of a radiopharmaceutical per Radiology protocol    Answer:   Yes    Order Specific Question:   Preferred imaging location?    Answer:   External      CHIEF COMPLAINT:  CC: An 87 year old male with history of diffuse large  B-cell lymphoma here for 3 month evaluation  Current Treatment:  Surveillance  INTERVAL HISTORY:  Ryan Ball is here today for repeat clinical assessment. He denies fevers or chills. He denies pain. His appetite is good. His weight has been stable.  I have reviewed the past medical history, past surgical history, social history and family history with the patient and they are unchanged from previous note.  ALLERGIES:  has No Known Allergies.  MEDICATIONS:  Current Outpatient Medications  Medication Sig Dispense Refill   alendronate (FOSAMAX) 10 MG tablet Take 10 mg by mouth every other day.     aspirin EC 81 MG tablet Take 1 tablet (81 mg total) by mouth daily. Swallow whole. 90 tablet 3   Cyanocobalamin 1000 MCG/ML KIT Inject 1,000 mcg as directed every 30 (thirty) days. Every 4 weeks     doxepin (SINEQUAN) 25 MG capsule Take 1 capsule by mouth at bedtime as needed (Sleeping).     nitroGLYCERIN (NITROSTAT) 0.4 MG SL tablet Place 1 tablet (0.4 mg total) under the tongue every 5 (five) minutes as needed for chest pain. 90 tablet 3   rosuvastatin (CRESTOR) 5 MG tablet Take 1 tablet by mouth every other day.      Vitamin D, Ergocalciferol, (DRISDOL) 1.25 MG (50000 UNIT) CAPS capsule Take 50,000 Units by mouth every 14 (fourteen) days.     No current facility-administered medications for this visit.    HISTORY OF PRESENT ILLNESS:   Oncology History  Diffuse  large B-cell lymphoma ([place])  10/19/2019 Cancer Staging   Staging form: Hodgkin and Non-Hodgkin Lymphoma, AJCC 8th Edition - Clinical stage from 10/19/2019: Stage II (Diffuse large B-cell lymphoma) - Signed by [name], MD on 04/13/2020 Prognostic indicators: 4.8 cm mass of posterior tongue base, CR on PET after 3 cycles R-CHOP    10/30/2019 Initial Diagnosis   Diffuse large B-cell lymphoma (H[facility])       REVIEW OF SYSTEMS:   Constitutional: Denies fevers, chills or abnormal weight loss Eyes: Denies blurriness of  vision Ears, nose, mouth, throat, and face: Denies mucositis or sore throat Respiratory: Denies cough, dyspnea or wheezes Cardiovascular: Denies palpitation, chest discomfort or lower extremity swelling Gastrointestinal:  Denies nausea, heartburn or change in bowel habits Skin: Denies abnormal skin rashes Lymphatics: Denies new lymphadenopathy or easy bruising Neurological:Denies numbness, tingling or new weaknesses Behavioral/Psych: Mood is stable, no new changes  All other systems were reviewed with the patient and are negative.   VITALS:  Blood pressure (!) 154/79, pulse 71, temperature 97.9 F (36.6 C), resp. rate 16, height 5' 9"  (4.536 m), weight 171 lb 6.4 oz (77.7 kg), SpO2 98 %.  Wt Readings from Last 3 Encounters:  04/23/21 171 lb 6.4 oz (77.7 kg)  03/06/21 170 lb 3.2 oz (77.2 kg)  01/28/21 165 lb (74.8 kg)    Body mass index is 25.31 kg/m.  Performance status (ECOG): 1 - Symptomatic but completely ambulatory  PHYSICAL EXAM:   GENERAL:alert, no distress and comfortable SKIN: skin color, texture, turgor are normal, no rashes or significant lesions EYES: normal, Conjunctiva are pink and non-injected, sclera clear OROPHARYNX:no exudate, no erythema and lips, buccal mucosa, and tongue normal  NECK: supple, thyroid normal size, non-tender, without nodularity LYMPH:  no palpable lymphadenopathy in the cervical, axillary or inguinal LUNGS: clear to auscultation and percussion with normal breathing effort HEART: regular rate & rhythm and no murmurs and no lower extremity edema ABDOMEN:abdomen soft, non-tender and normal bowel sounds Musculoskeletal:no cyanosis of digits and no clubbing  NEURO: alert & oriented x 3 with fluent speech, no focal motor/sensory deficits  LABORATORY DATA:  I have reviewed the data as listed    Component Value Date/Time   NA 139 04/23/2021 0000   K 4.4 04/23/2021 0000   CL 105 04/23/2021 0000   CO2 28 (A) 04/23/2021 0000   BUN 17 04/23/2021  0000   CREATININE 1.0 04/23/2021 0000   CALCIUM 8.9 04/23/2021 0000   ALBUMIN 3.8 04/23/2021 0000   AST 31 04/23/2021 0000   ALT 21 04/23/2021 0000   ALKPHOS 65 04/23/2021 0000    No results found for: SPEP, UPEP  Lab Results  Component Value Date   WBC 5.9 04/23/2021   NEUTROABS 3.84 04/23/2021   HGB 12.7 (A) 04/23/2021   HCT 38 (A) 04/23/2021   PLT 178 04/23/2021      Chemistry      Component Value Date/Time   NA 139 04/23/2021 0000   K 4.4 04/23/2021 0000   CL 105 04/23/2021 0000   CO2 28 (A) 04/23/2021 0000   BUN 17 04/23/2021 0000   CREATININE 1.0 04/23/2021 0000   GLU 79 04/23/2021 0000      Component Value Date/Time   CALCIUM 8.9 04/23/2021 0000   ALKPHOS 65 04/23/2021 0000   AST 31 04/23/2021 0000   ALT 21 04/23/2021 0000       RADIOGRAPHIC STUDIES: I have personally reviewed the radiological images as listed and agreed with the findings in  the report. No results found.

## 2021-04-23 NOTE — Assessment & Plan Note (Signed)
Stage II diffuse large B-cell lymphoma.  He tolerated treatment with R-mini-CHOP without significant difficulty, and completed 3 cycles at the end of August.  PET imaging has revealed remission with no evidence of metabolically active lymphoma.  The mass at the tongue base has resolved.  He completed consolidation radiation in November 2021.  PET imaging from September remains stable with no evidence of residual or recurrent disease. There is a stable single 9 mm, weakly hypermetabolic left level 1B lymph node, likely inflammatory but attention on future studies is suggested. He continues to do well without any signs of recurrence. He follows closely with cardiology. We will plan to repeat PET scan in 3 months and have him return to clinic for repeat evaluation in 3 months.

## 2021-05-07 ENCOUNTER — Telehealth: Payer: Self-pay | Admitting: Hematology and Oncology

## 2021-05-07 NOTE — Telephone Encounter (Signed)
Per 12/15 LOS, patient scheduled for March 2023 Appt's.  Patient Notified

## 2021-05-12 DIAGNOSIS — D510 Vitamin B12 deficiency anemia due to intrinsic factor deficiency: Secondary | ICD-10-CM | POA: Diagnosis not present

## 2021-05-22 IMAGING — CT CT NECK W/ CM
3 of 4 series · 13 of 33 positions shown, 16 images · IV contrast (OMNIPAQUE)
Comparison: None.

CLINICAL DATA: Difficulty swallowing.  Tongue base mass.

EXAM:
CT NECK WITH CONTRAST
TECHNIQUE: Multidetector CT imaging of the neck was performed using the
standard protocol following the bolus administration of intravenous
contrast.
CONTRAST:  75mL OMNIPAQUE IOHEXOL 300 MG/ML  SOLN

[Series 2: axial neck · axial · 0.64mm/px · z∈[-248,-96]mm · 5 of 115 slices shown, 7 images]
[im 20/115  soft-tissue]
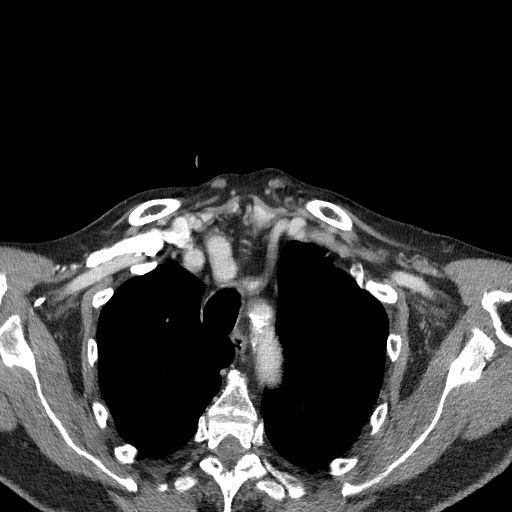
[im 20/115  bone]
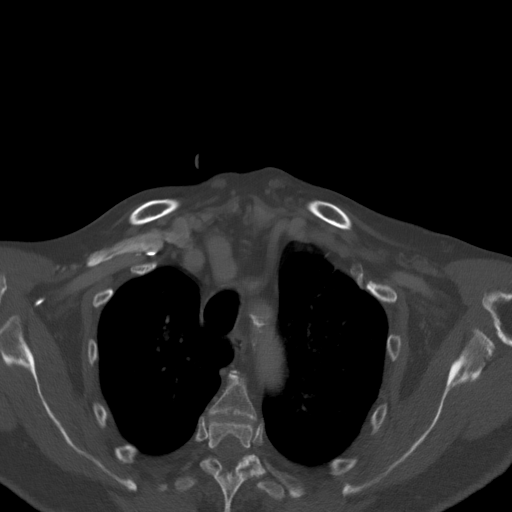
[im 39/115  bone]
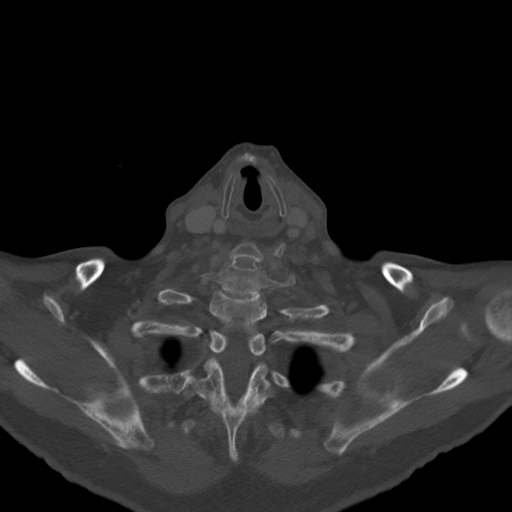
[im 58/115  bone]
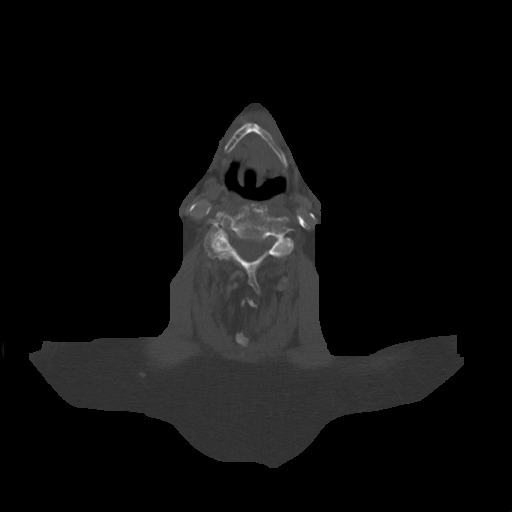
[im 77/115  bone]
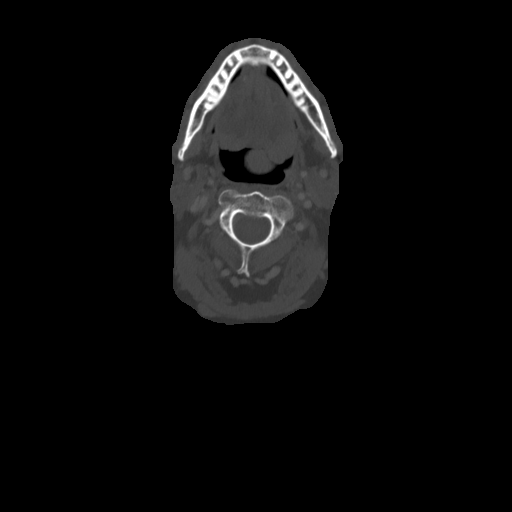
[im 96/115  soft-tissue]
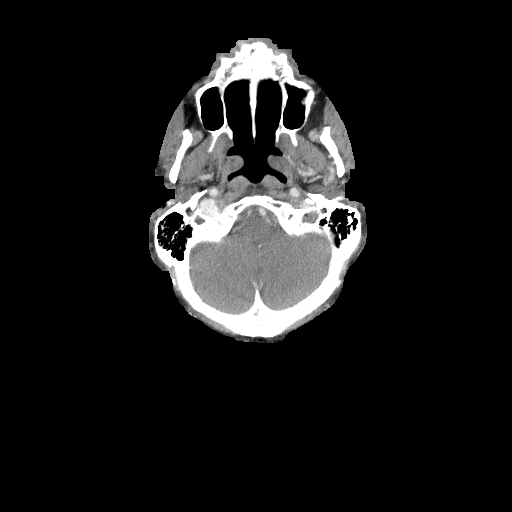
[im 96/115  bone]
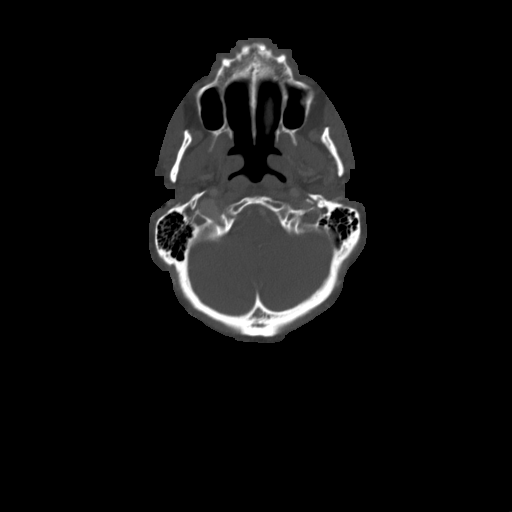

[Series 6: cor neck · coronal · 0.47mm/px · 3 of 177 slices shown]
[im 36/177  bone]
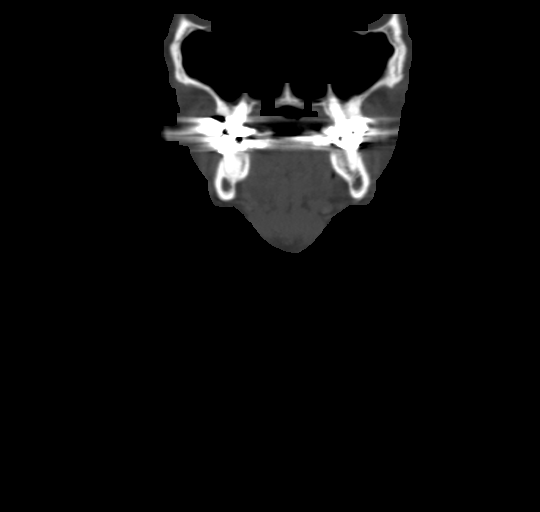
[im 71/177  bone]
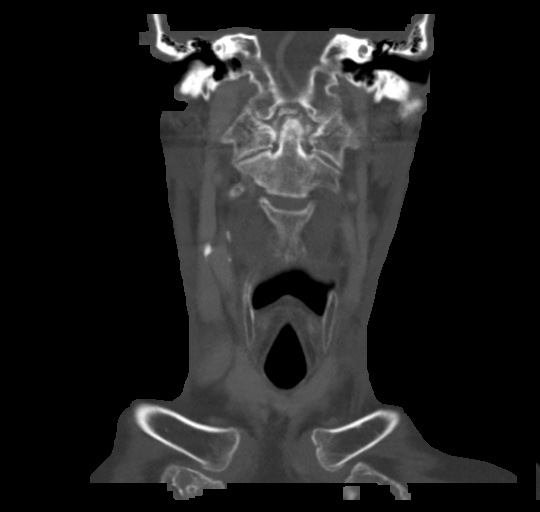
[im 106/177  bone]
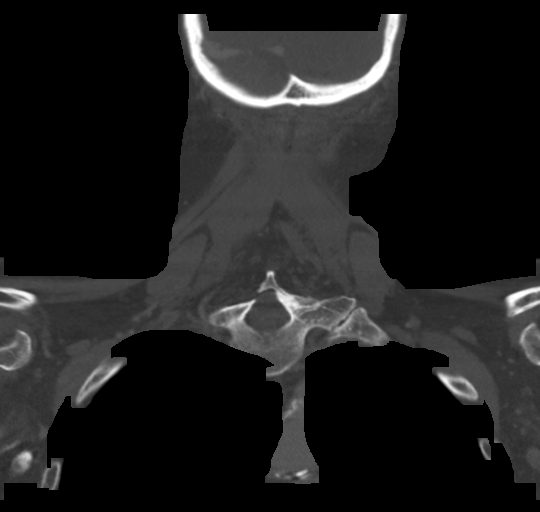

[Series 7: sag neck · sagittal · 0.45mm/px · 5 of 129 slices shown, 6 images]
[im 43/129  bone]
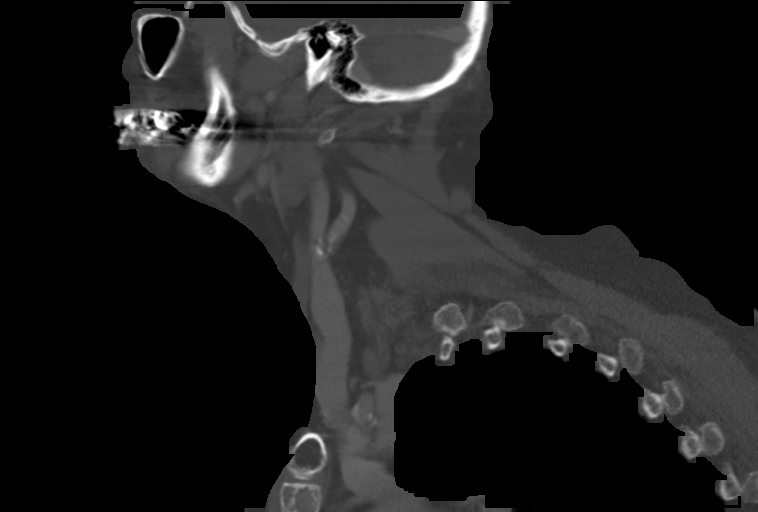
[im 54/129  bone]
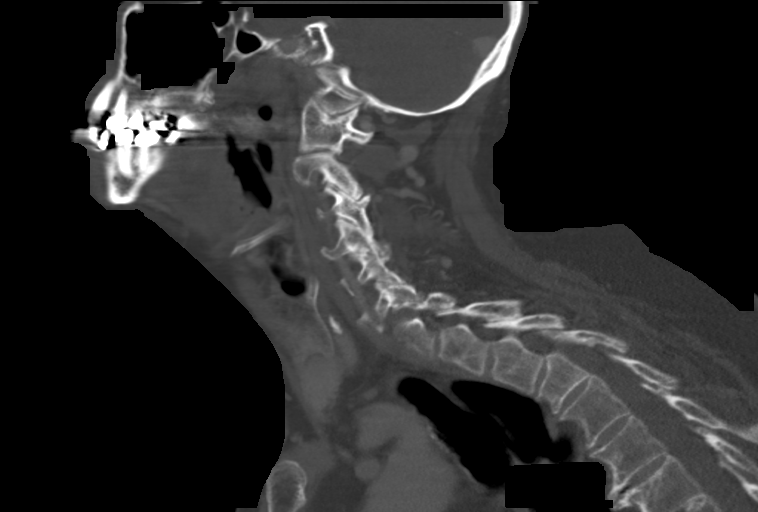
[im 65/129  soft-tissue]
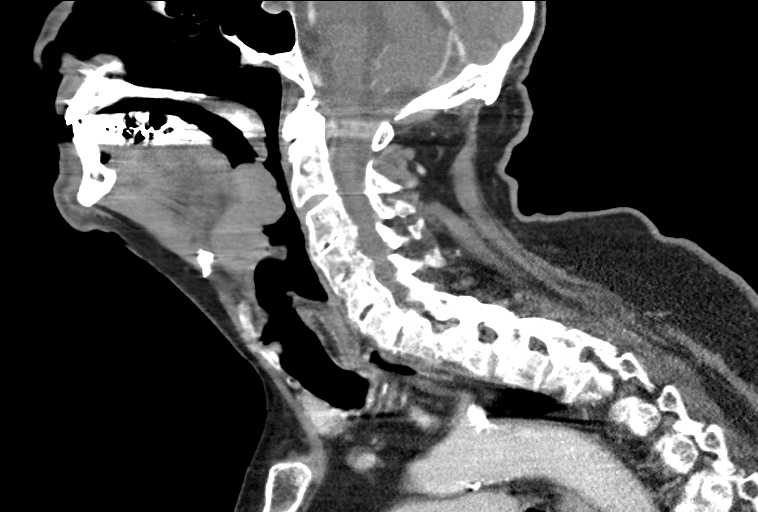
[im 65/129  bone]
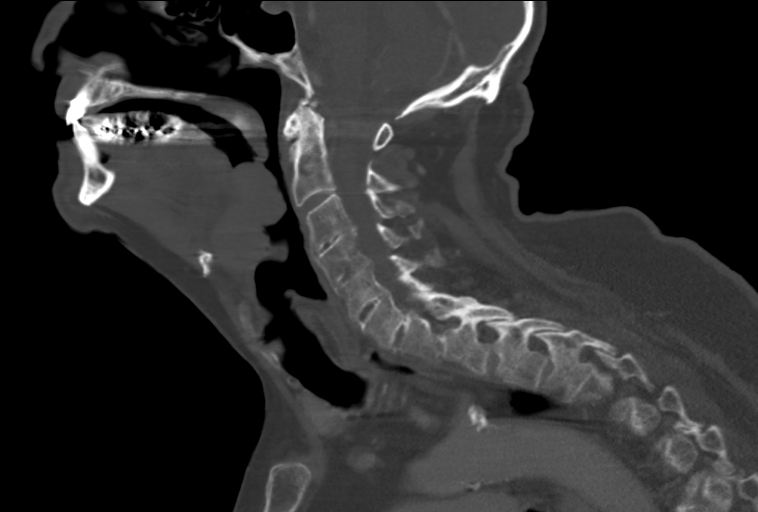
[im 75/129  bone]
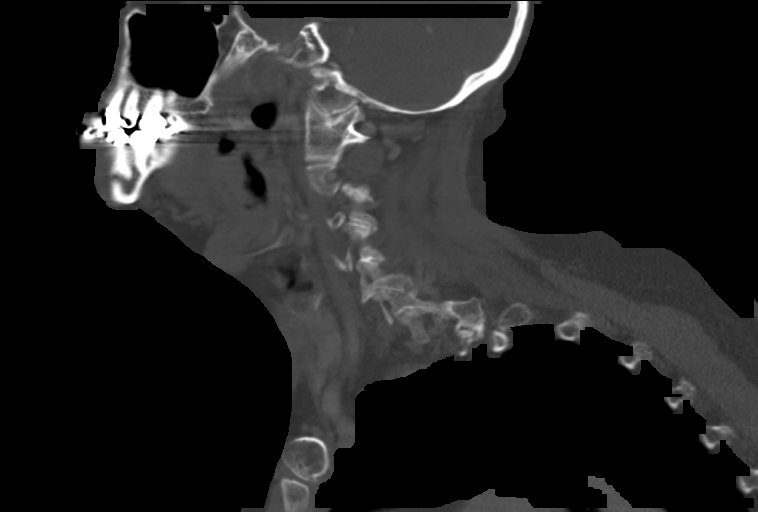
[im 86/129  bone]
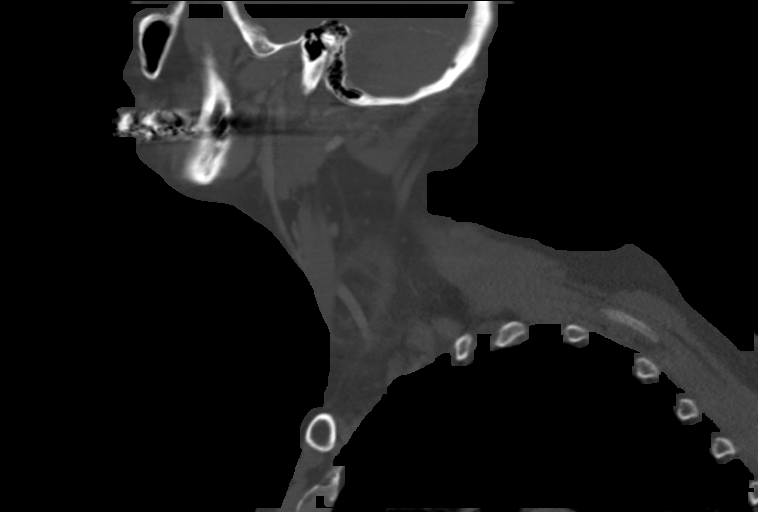

[13 of 33 positions shown; findings below may reference images not displayed]

FINDINGS: Pharynx and larynx: There is a mass at posterior base of tongue,
filling the vallecula. The mass is lobulated and measures 2.4 x
x 4.8 cm. The mass appears confined to the hypopharynx in the region
of the lingual tonsils. No extension into the sublingual or
submandibular space. Positions of the tongue muscles are normal. The
mass abuts the epiglottis but there is no clear evidence of
invasion. Larynx is normal.

Salivary glands: No inflammation, mass, or stone.

Thyroid: Normal.

Lymph nodes: 10 mm left level 2A node (series 2, image 50). 10 mm
right level 2A node (image 59).

Vascular: Calcific aortic atherosclerosis.

Limited intracranial: Negative.

Visualized orbits: Negative.

Mastoids and visualized paranasal sinuses: Clear.

Skeleton: No acute or aggressive process.

Upper chest: Negative.

Other: None
IMPRESSION: 1. A 2.4 x 3.9 x 4.8 cm mass at the posterior base of tongue,
filling the vallecula. No evidence of invasion of local structures.
2. Bilateral level 2A borderline enlarged lymph nodes. These could
be incidental, but if tongue base mass is proven to be malignant,
then attention on follow-up is warranted.
3. Aortic Atherosclerosis ([id number]).

## 2021-05-26 DIAGNOSIS — L570 Actinic keratosis: Secondary | ICD-10-CM | POA: Diagnosis not present

## 2021-05-26 DIAGNOSIS — L209 Atopic dermatitis, unspecified: Secondary | ICD-10-CM | POA: Diagnosis not present

## 2021-06-09 DIAGNOSIS — L290 Pruritus ani: Secondary | ICD-10-CM | POA: Diagnosis not present

## 2021-06-09 DIAGNOSIS — M8580 Other specified disorders of bone density and structure, unspecified site: Secondary | ICD-10-CM | POA: Diagnosis not present

## 2021-06-09 DIAGNOSIS — Z6826 Body mass index (BMI) 26.0-26.9, adult: Secondary | ICD-10-CM | POA: Diagnosis not present

## 2021-06-09 DIAGNOSIS — Z6827 Body mass index (BMI) 27.0-27.9, adult: Secondary | ICD-10-CM | POA: Diagnosis not present

## 2021-06-17 ENCOUNTER — Encounter: Payer: Self-pay | Admitting: Cardiology

## 2021-06-17 ENCOUNTER — Other Ambulatory Visit: Payer: Self-pay

## 2021-06-17 ENCOUNTER — Ambulatory Visit (INDEPENDENT_AMBULATORY_CARE_PROVIDER_SITE_OTHER): Payer: Medicare Other | Admitting: Cardiology

## 2021-06-17 VITALS — BP 124/70 | HR 88 | Ht 69.0 in | Wt 172.4 lb

## 2021-06-17 DIAGNOSIS — C8331 Diffuse large B-cell lymphoma, lymph nodes of head, face, and neck: Secondary | ICD-10-CM | POA: Diagnosis not present

## 2021-06-17 DIAGNOSIS — I25118 Atherosclerotic heart disease of native coronary artery with other forms of angina pectoris: Secondary | ICD-10-CM | POA: Diagnosis not present

## 2021-06-17 DIAGNOSIS — I471 Supraventricular tachycardia: Secondary | ICD-10-CM

## 2021-06-17 DIAGNOSIS — I420 Dilated cardiomyopathy: Secondary | ICD-10-CM

## 2021-06-17 MED ORDER — ASPIRIN EC 81 MG PO TBEC: 81.0000 mg | DELAYED_RELEASE_TABLET | Freq: Every day | ORAL | 1 refills | Status: DC

## 2021-06-17 NOTE — Progress Notes (Signed)
Cardiology Office Note:    Date:  06/17/2021   ID:  Ryan Ball, DOB 09/08/1930, MRN [id number]  PCP:  Angelina Sheriff, MD  Cardiologist:  Jenne Campus, MD    Referring MD: Angelina Sheriff, MD   Chief Complaint  Patient presents with   Follow-up  I am doing fine  History of Present Illness:    Ryan Ball is a 88 y.o. male with past medical history significant for diffuse large B-cell lymphoma, status postchemotherapy, history of cardiomyopathy with ejection fraction 45% in the summer 2021 however latest echocardiogram showed normalization of left ventricle ejection fraction, he also got mildly abnormal stress test showing minimal area of ischemia involving apex however he prefers conservative approach.  I brought him today to my office to see how he does overall he is doing quite well he still exercise on the regular basis he walks on the track 3-4 times a week and he tells me when he speeds up he gets a little short of breath but no chest pain tightness squeezing pressure burning chest.  He describes however situation when he gets up in the morning to take a shower when he bends forward he will feel some pressure in the upper chest when he gets up things gets better.  Denies having dizziness or passing out, no palpitations  Past Medical History:  Diagnosis Date   Anemia due to antineoplastic chemotherapy 04/13/2020   Atypical chest pain 10/10/2018   Cardiomegaly 06/08/2016   Diffuse large B-cell lymphoma ([place]) 10/30/2019   Dilated cardiomyopathy ([place]) ejection fraction 45% in summer 2021 by echo 05/20/2020   Encounter for venous access device care 04/09/2020   Esophageal reflux 10/10/2018   Hypercholesteremia 10/10/2018   Postoperative examination 12/12/2019   Senile nuclear sclerosis 05/19/2012   Senile nuclear sclerosis, bilateral 11/02/2019   Sleep disturbance 10/10/2018   SVT (supraventricular tachycardia) ([place]) 09/20/2019    Past Surgical History:   Procedure Laterality Date   CHOLECYSTECTOMY      Current Medications: Current Meds  Medication Sig   alendronate (FOSAMAX) 10 MG tablet Take 10 mg by mouth every other day.   Cyanocobalamin 1000 MCG/ML KIT Inject 1,000 mcg as directed every 30 (thirty) days. Every 4 weeks   doxepin (SINEQUAN) 25 MG capsule Take 1 capsule by mouth at bedtime as needed (Sleeping).   nitroGLYCERIN (NITROSTAT) 0.4 MG SL tablet Place 1 tablet (0.4 mg total) under the tongue every 5 (five) minutes as needed for chest pain.   rosuvastatin (CRESTOR) 5 MG tablet Take 1 tablet by mouth every other day.    Vitamin D, Ergocalciferol, (DRISDOL) 1.25 MG (50000 UNIT) CAPS capsule Take 50,000 Units by mouth every 14 (fourteen) days.     Allergies:   Patient has no known allergies.   Social History   Socioeconomic History   Marital status: Widowed    Spouse name: Not on file   Number of children: Not on file   Years of education: Not on file   Highest education level: Not on file  Occupational History   Not on file  Tobacco Use   Smoking status: Never   Smokeless tobacco: Never  Substance and Sexual Activity   Alcohol use: Yes    Alcohol/week: 6.0 standard drinks    Types: 6 Glasses of wine per week    Comment: 1-2 glasses of wine 2-3 times a week   Drug use: Never   Sexual activity: Not on file  Other Topics Concern  Not on file  Social History Narrative   Not on file   Social Determinants of Health   Financial Resource Strain: Not on file  Food Insecurity: Not on file  Transportation Needs: Not on file  Physical Activity: Not on file  Stress: Not on file  Social Connections: Not on file     Family History: The patient's family history includes CAD in his brother and father. ROS:   Please see the history of present illness.    All 14 point review of systems negative except as described per history of present illness  EKGs/Labs/Other Studies Reviewed:      Recent Labs: 04/23/2021: ALT  21; BUN 17; Creatinine 1.0; Hemoglobin 12.7; Platelets 178; Potassium 4.4; Sodium 139  Recent Lipid Panel No results found for: CHOL, TRIG, HDL, CHOLHDL, VLDL, LDLCALC, LDLDIRECT  Physical Exam:    VS:  BP 124/70 (BP Location: Left Arm, Patient Position: Sitting)    Pulse 88    Ht 5' 9"  (1.753 m)    Wt 172 lb 6.4 oz (78.2 kg)    SpO2 96%    BMI 25.46 kg/m     Wt Readings from Last 3 Encounters:  06/17/21 172 lb 6.4 oz (78.2 kg)  04/23/21 171 lb 6.4 oz (77.7 kg)  03/06/21 170 lb 3.2 oz (77.2 kg)     GEN:  Well nourished, well developed in no acute distress HEENT: Normal NECK: No JVD; No carotid bruits LYMPHATICS: No lymphadenopathy CARDIAC: RRR, no murmurs, no rubs, no gallops RESPIRATORY:  Clear to auscultation without rales, wheezing or rhonchi  ABDOMEN: Soft, non-tender, non-distended MUSCULOSKELETAL:  No edema; No deformity  SKIN: Warm and dry LOWER EXTREMITIES: no swelling NEUROLOGIC:  Alert and oriented x 3 PSYCHIATRIC:  Normal affect   ASSESSMENT:    1. Dilated cardiomyopathy ([facility]) ejection fraction 45% in summer 2021 by echo   2. Coronary artery disease of native artery of native heart with stable angina pectoris ([place])   3. SVT (supraventricular tachycardia) ([place])   4. Diffuse large B-cell lymphoma of lymph nodes of neck ([facility])    PLAN:    In order of problems listed above:  Dilated cardiomyopathy with normalization.  She is very reluctant to take any more medications.  I have noticed today that he is not taking aspirin I explained to him what the purpose of aspirin etc. he is willing to continue. Coronary artery disease with minimal abnormal stress test again he does not want to do anything about it he tells me to all to have imaging done I feel great I do not want a mess things up.  I hopefully was able to convince him to start getting aspirin every single day. History of SVT denies having a palpitations Diffuse large B-cell lymphoma that being in remission  apparently stable.  Dyslipidemia I did review K PN which showed me data from May of last year with total cholesterol 151 HDL 59, I do not have LDL he is taking Crestor 5 facility he can tolerate, will continue   Medication Adjustments/Labs and Tests Ordered: Current medicines are reviewed at length with the patient today.  Concerns regarding medicines are outlined above.  No orders of the defined types were placed in this encounter.  Medication changes: No orders of the defined types were placed in this encounter.   Signed, Park Liter, MD, [facility] 06/17/2021 1:36 PM    [place]

## 2021-06-17 NOTE — Addendum Note (Signed)
Addended by: Darrel Reach on: 06/17/2021 01:42 PM   Modules accepted: Orders

## 2021-06-17 NOTE — Patient Instructions (Signed)
Medication Instructions:  Your physician has recommended you make the following change in your medication: Start Aspirin 81 mg once a daily.    *If you need a refill on your cardiac medications before your next appointment, please call your pharmacy*   Lab Work: None If you have labs (blood work) drawn today and your tests are completely normal, you will receive your results only by: [facility] (if you have MyChart) OR A paper copy in the mail If you have any lab test that is abnormal or we need to change your treatment, we will call you to review the results.   Testing/Procedures: None   Follow-Up: At [facility], you and your health needs are our priority.  As part of our continuing mission to provide you with exceptional heart care, we have created designated Provider Care Teams.  These Care Teams include your primary Cardiologist (physician) and Advanced Practice Providers (APPs -  Physician Assistants and Nurse Practitioners) who all work together to provide you with the care you need, when you need it.  We recommend signing up for the patient portal called "MyChart".  Sign up information is provided on this After Visit Summary.  MyChart is used to connect with patients for Virtual Visits (Telemedicine).  Patients are able to view lab/test results, encounter notes, upcoming appointments, etc.  Non-urgent messages can be sent to your provider as well.   To learn more about what you can do with MyChart, go to [email or web address].    Your next appointment:   5 month(s)  The format for your next appointment:   In Person  Provider:   Jenne Campus, MD    Other Instructions

## 2021-06-22 DIAGNOSIS — L290 Pruritus ani: Secondary | ICD-10-CM | POA: Diagnosis not present

## 2021-06-22 DIAGNOSIS — N419 Inflammatory disease of prostate, unspecified: Secondary | ICD-10-CM | POA: Diagnosis not present

## 2021-06-22 DIAGNOSIS — D510 Vitamin B12 deficiency anemia due to intrinsic factor deficiency: Secondary | ICD-10-CM | POA: Diagnosis not present

## 2021-06-22 DIAGNOSIS — Z6826 Body mass index (BMI) 26.0-26.9, adult: Secondary | ICD-10-CM | POA: Diagnosis not present

## 2021-06-26 ENCOUNTER — Telehealth: Payer: Self-pay | Admitting: Oncology

## 2021-06-26 NOTE — Telephone Encounter (Signed)
PET Scan has been R/S'd for 07/15/21 check in at 7:45.   Unable to reach via phone, voicemail was left with appt date,time and instructions.

## 2021-07-07 DIAGNOSIS — L570 Actinic keratosis: Secondary | ICD-10-CM | POA: Diagnosis not present

## 2021-07-07 DIAGNOSIS — L209 Atopic dermatitis, unspecified: Secondary | ICD-10-CM | POA: Diagnosis not present

## 2021-07-13 ENCOUNTER — Encounter: Payer: Self-pay | Admitting: Hematology and Oncology

## 2021-07-15 DIAGNOSIS — C8331 Diffuse large B-cell lymphoma, lymph nodes of head, face, and neck: Secondary | ICD-10-CM | POA: Diagnosis not present

## 2021-07-15 DIAGNOSIS — C8590 Non-Hodgkin lymphoma, unspecified, unspecified site: Secondary | ICD-10-CM | POA: Diagnosis not present

## 2021-07-15 NOTE — Progress Notes (Incomplete)
[facility]  [address] [place],  [place]  [address] [phone]  Clinic Day:  07/15/2021  Referring physician: Angelina Sheriff, MD  This document serves as a record of services personally performed by Ryan Poisson, MD. It was created on their behalf by Ryan Ball, a trained medical scribe. The creation of this record is based on the scribe's personal observations and the provider's statements to them.  ASSESSMENT & PLAN:   1. Stage II diffuse large B-cell lymphoma.  He tolerated treatment with R-mini-CHOP without significant difficulty, and completed 3 cycles at the end of August.  PET imaging has revealed remission with no evidence of metabolically active lymphoma.  The mass at the tongue base has resolved.  He completed consolidation radiation in November 2021.  PET imaging from September remains stable with no evidence of residual or recurrent disease. There is a stable single 9 mm, weakly hypermetabolic left level 1B lymph node, likely inflammatory but attention on future studies is suggested.    2. Mildly impaired left ventricular systolic function with an EF between 45-50%. He continues to follow up with Dr. Agustin Ball, his cardiologist.    3. Anemia, stable, likely secondary to prior chemotherapy. We will evaluate further with iron studies, B12 and folate.  Recent PET imaging has revealed ***. I reviewed these results with the patient and his daughter and answered their questions. We will plan to see him back in 3 months with CBC, CMP, LDH and port flush for repeat evaluation. He verbalizes understanding of and agreement to the plans discussed today. He knows to call the office should any new questions or concerns arise.  I provided *** minutes of face-to-face time during this this encounter and > 50% was spent counseling as documented under my assessment and plan.    Ryan Kaplan, MD [facility] [address] [place] [address] Dept: [phone] Dept Fax: [phone]    CHIEF COMPLAINT:  CC: History of stage IIA large B-cell lymphoma  Current Treatment:  Surveillance   HISTORY OF PRESENT ILLNESS:  Ryan Ball is a 88 y.o. male with stage IIA diffuse large B-cell lymphoma diagnosed in June 2021.  He presented with voice changes and dysphagia.  CT imaging revealed 2.4 x 3.9 x 4.8 cm posterior tongue base mass, as well as mild bilateral cervical lymphadenopathy.  He saw Ryan Ball and underwent biopsy, which revealed diffuse large B-cell lymphoma, non-germinal center phenotype, which was CD45, CD19, CD20 and CD38 positive.  PET-CT revealed hypermetabolic tongue base mass measuring 4.8 x 2.1 cm with an SUV of 15.5 with bilateral cervical lymph nodes measuring 1.5 cm with an SUV max of 11.3.  He was seen by Ryan Ball at [facility].  She recommended starting with R-mini-CHOP given every 3 weeks, increasing the doses of chemotherapy with each cycle as tolerated.  Prior to chemotherapy he had an echocardiogram, which revealed mild concentric left ventricular hypertrophy, with mild global hypokinesis of the left ventricle and overall mildly impaired left ventricular systolic function with an ejection fraction of 45-50%.  He had his 1st cycle of R-mini-CHOP, with the chemotherapy given at 50% dose reduction, on July 12th. He received his second cycle at 75% dose and a third cycle at 90%. PET imaging from September 2021 revealed no evidence of metabolically active lymphoma within the neck, chest, abdomen or pelvis.  The previously demonstrated mass at the base  of the tongue has resolved.  He was referred for involved field radiation for consolidation.  PET imaging from February 2022 revealed a stable exam with no evidence for metabolically active lymphoma in the neck, chest, abdomen, or pelvis.  He had a skin cancer removed at Dr.  Jimmye Ball office, and underwent Mohs surgery of the right lower leg and is caring for the wound accordingly.  INTERVAL HISTORY:  I have reviewed his chart and materials related to his cancer extensively and collaborated history with the patient. Summary of oncologic history is as follows: Oncology History  Diffuse large B-cell lymphoma ([facility] [place])  10/19/2019 Cancer Staging   Staging form: Hodgkin and Non-Hodgkin Lymphoma, AJCC 8th Edition - Clinical stage from 10/19/2019: Stage II (Diffuse large B-cell lymphoma) - Signed by Ryan Kaplan, MD on 04/13/2020 Prognostic indicators: 4.8 cm mass of posterior tongue base, CR on PET after 3 cycles R-CHOP    10/30/2019 Initial Diagnosis   Diffuse large B-cell lymphoma (HCC)     Ryan Ball is here for routine follow up and to review recent imaging results. PET imaging from March 8th revealed ***.   His  appetite is good, and he has gained/lost _ pounds since his last visit.  He denies fever, chills or other signs of infection.  He denies nausea, vomiting, bowel issues, or abdominal pain.  He denies sore throat, cough, dyspnea, or chest pain.  HISTORY:   Allergies: No Known Allergies  Current Medications: Current Outpatient Medications  Medication Sig Dispense Refill   alendronate (FOSAMAX) 10 MG tablet Take 10 mg by mouth every other day.     aspirin EC 81 MG tablet Take 1 tablet (81 mg total) by mouth daily. Swallow whole. 90 tablet 1   Cyanocobalamin 1000 MCG/ML KIT Inject 1,000 mcg as directed every 30 (thirty) days. Every 4 weeks     doxepin (SINEQUAN) 25 MG capsule Take 1 capsule by mouth at bedtime as needed (Sleeping).     nitroGLYCERIN (NITROSTAT) 0.4 MG SL tablet Place 1 tablet (0.4 mg total) under the tongue every 5 (five) minutes as needed for chest pain. 90 tablet 3   rosuvastatin (CRESTOR) 5 MG tablet Take 1 tablet by mouth every other day.      Vitamin D, Ergocalciferol, (DRISDOL) 1.25 MG (50000 UNIT) CAPS capsule Take 50,000 Units by  mouth every 14 (fourteen) days.     No current facility-administered medications for this visit.    REVIEW OF SYSTEMS:  Review of Systems - Oncology    VITALS:  There were no vitals taken for this visit.  Wt Readings from Last 3 Encounters:  06/17/21 172 lb 6.4 oz (78.2 kg)  04/23/21 171 lb 6.4 oz (77.7 kg)  03/06/21 170 lb 3.2 oz (77.2 kg)    There is no height or weight on file to calculate BMI.  Performance status (ECOG): {CHL ONC [id number]  PHYSICAL EXAM:  Physical Exam   LABS:   CBC Latest Ref Rng & Units 04/23/2021 01/19/2021 10/09/2020  WBC - 5.9 6.3 5.0  Hemoglobin 13.5 - 17.5 12.7(A) 12.9(A) 12.9(A)  Hematocrit 41 - 53 38(A) 41 39(A)  Platelets 150 - 399 178 186 151   CMP Latest Ref Rng & Units 04/23/2021 01/19/2021 10/09/2020  BUN 4 - _0 Creatinine 0.6 - 1.3 1.0 1.0 0.8  Sodium 137 - 147 139 139 140  Potassium 3.4 - 5.3 4.4 4.2 4.3  Chloride 99 - 108 105 103 104  CO2 13 - 22 28(A) 27(A)  29(A)  Calcium 8.7 - 10.7 8.9 9.5 9.0  Alkaline Phos 25 - 125 65 72 65  AST 14 - 40 _0 ALT 10 - 40 _1 Lab Results  Component Value Date   TIBC 334 01/22/2021   FERRITIN 48 01/22/2021   IRONPCTSAT 34 01/22/2021   Lab Results  Component Value Date   LDH 119 04/23/2021   LDH 110 10/09/2020   LDH 113 04/09/2020    STUDIES:  No results found.    I, Rita Ohara, am acting as scribe for Ryan Kaplan, MD  I have reviewed this report as typed by the medical scribe, and it is complete and accurate.

## 2021-07-21 ENCOUNTER — Other Ambulatory Visit: Payer: Self-pay | Admitting: Oncology

## 2021-07-21 DIAGNOSIS — C8331 Diffuse large B-cell lymphoma, lymph nodes of head, face, and neck: Secondary | ICD-10-CM

## 2021-07-22 ENCOUNTER — Other Ambulatory Visit: Payer: Self-pay | Admitting: Hematology and Oncology

## 2021-07-22 ENCOUNTER — Inpatient Hospital Stay (INDEPENDENT_AMBULATORY_CARE_PROVIDER_SITE_OTHER): Payer: Medicare Other | Admitting: Oncology

## 2021-07-22 ENCOUNTER — Other Ambulatory Visit: Payer: Self-pay | Admitting: Oncology

## 2021-07-22 ENCOUNTER — Encounter: Payer: Self-pay | Admitting: Oncology

## 2021-07-22 ENCOUNTER — Telehealth: Payer: Self-pay | Admitting: Oncology

## 2021-07-22 ENCOUNTER — Inpatient Hospital Stay: Payer: Medicare Other | Attending: Hematology and Oncology

## 2021-07-22 VITALS — BP 170/83 | HR 73 | Temp 97.6°F | Resp 18 | Ht 69.0 in | Wt 171.7 lb

## 2021-07-22 DIAGNOSIS — Z8572 Personal history of non-Hodgkin lymphomas: Secondary | ICD-10-CM | POA: Insufficient documentation

## 2021-07-22 DIAGNOSIS — C8331 Diffuse large B-cell lymphoma, lymph nodes of head, face, and neck: Secondary | ICD-10-CM

## 2021-07-22 DIAGNOSIS — Z452 Encounter for adjustment and management of vascular access device: Secondary | ICD-10-CM

## 2021-07-22 DIAGNOSIS — D6481 Anemia due to antineoplastic chemotherapy: Secondary | ICD-10-CM | POA: Diagnosis not present

## 2021-07-22 LAB — BASIC METABOLIC PANEL
BUN: 17 (ref 4–21)
CO2: 26 — AB (ref 13–22)
Chloride: 108 (ref 99–108)
Creatinine: 1 (ref 0.6–1.3)
Glucose: 96
Potassium: 4.3 mEq/L (ref 3.5–5.1)
Sodium: 139 (ref 137–147)

## 2021-07-22 LAB — LACTATE DEHYDROGENASE: LDH: 125 U/L (ref 98–192)

## 2021-07-22 LAB — HEPATIC FUNCTION PANEL
ALT: 17 U/L (ref 10–40)
AST: 26 (ref 14–40)
Alkaline Phosphatase: 66 (ref 25–125)
Bilirubin, Total: 0.6

## 2021-07-22 LAB — CBC AND DIFFERENTIAL
HCT: 41 (ref 41–53)
Hemoglobin: 13 — AB (ref 13.5–17.5)
Neutrophils Absolute: 5.32
Platelets: 190 10*3/uL (ref 150–400)
WBC: 7.6

## 2021-07-22 LAB — COMPREHENSIVE METABOLIC PANEL
Albumin: 4 (ref 3.5–5.0)
Calcium: 9.1 (ref 8.7–10.7)

## 2021-07-22 LAB — CBC: RBC: 4.62 (ref 3.87–5.11)

## 2021-07-22 MED ORDER — SODIUM CHLORIDE 0.9% FLUSH
10.0000 mL | INTRAVENOUS | Status: DC | PRN
  Administered 2021-07-22: 10 mL

## 2021-07-22 MED ORDER — HEPARIN SOD (PORK) LOCK FLUSH 100 UNIT/ML IV SOLN
500.0000 [IU] | Freq: Once | INTRAVENOUS | Status: AC | PRN
  Administered 2021-07-22: 500 [IU]

## 2021-07-22 NOTE — Telephone Encounter (Signed)
Per 07/22/21 los next appt scheduled and confirmed with patient ?

## 2021-08-02 ENCOUNTER — Encounter: Payer: Self-pay | Admitting: Oncology

## 2021-08-24 DIAGNOSIS — D510 Vitamin B12 deficiency anemia due to intrinsic factor deficiency: Secondary | ICD-10-CM | POA: Diagnosis not present

## 2021-09-10 DIAGNOSIS — Z6826 Body mass index (BMI) 26.0-26.9, adult: Secondary | ICD-10-CM | POA: Diagnosis not present

## 2021-09-10 DIAGNOSIS — J019 Acute sinusitis, unspecified: Secondary | ICD-10-CM | POA: Diagnosis not present

## 2021-09-21 DIAGNOSIS — D519 Vitamin B12 deficiency anemia, unspecified: Secondary | ICD-10-CM | POA: Diagnosis not present

## 2021-10-08 DIAGNOSIS — I700 Atherosclerosis of aorta: Secondary | ICD-10-CM | POA: Diagnosis not present

## 2021-10-08 DIAGNOSIS — E559 Vitamin D deficiency, unspecified: Secondary | ICD-10-CM | POA: Diagnosis not present

## 2021-10-08 DIAGNOSIS — Z0000 Encounter for general adult medical examination without abnormal findings: Secondary | ICD-10-CM | POA: Diagnosis not present

## 2021-10-08 DIAGNOSIS — Z6826 Body mass index (BMI) 26.0-26.9, adult: Secondary | ICD-10-CM | POA: Diagnosis not present

## 2021-10-08 DIAGNOSIS — E7800 Pure hypercholesterolemia, unspecified: Secondary | ICD-10-CM | POA: Diagnosis not present

## 2021-10-08 DIAGNOSIS — I471 Supraventricular tachycardia: Secondary | ICD-10-CM | POA: Diagnosis not present

## 2021-11-16 DIAGNOSIS — D510 Vitamin B12 deficiency anemia due to intrinsic factor deficiency: Secondary | ICD-10-CM | POA: Diagnosis not present

## 2021-12-10 DIAGNOSIS — L209 Atopic dermatitis, unspecified: Secondary | ICD-10-CM | POA: Diagnosis not present

## 2021-12-23 ENCOUNTER — Encounter: Payer: Self-pay | Admitting: Cardiology

## 2021-12-23 ENCOUNTER — Other Ambulatory Visit: Payer: Self-pay | Admitting: Oncology

## 2021-12-23 ENCOUNTER — Inpatient Hospital Stay: Payer: Medicare Other

## 2021-12-23 ENCOUNTER — Encounter: Payer: Self-pay | Admitting: Oncology

## 2021-12-23 ENCOUNTER — Inpatient Hospital Stay: Payer: Medicare Other | Attending: Oncology | Admitting: Oncology

## 2021-12-23 ENCOUNTER — Ambulatory Visit (INDEPENDENT_AMBULATORY_CARE_PROVIDER_SITE_OTHER): Payer: Medicare Other | Admitting: Cardiology

## 2021-12-23 VITALS — BP 148/70 | HR 75 | Temp 97.4°F | Resp 18 | Ht 69.0 in | Wt 173.3 lb

## 2021-12-23 VITALS — BP 130/70 | HR 72 | Ht 69.0 in | Wt 172.0 lb

## 2021-12-23 DIAGNOSIS — I471 Supraventricular tachycardia: Secondary | ICD-10-CM | POA: Diagnosis not present

## 2021-12-23 DIAGNOSIS — D6481 Anemia due to antineoplastic chemotherapy: Secondary | ICD-10-CM

## 2021-12-23 DIAGNOSIS — C8331 Diffuse large B-cell lymphoma, lymph nodes of head, face, and neck: Secondary | ICD-10-CM

## 2021-12-23 DIAGNOSIS — I25118 Atherosclerotic heart disease of native coronary artery with other forms of angina pectoris: Secondary | ICD-10-CM

## 2021-12-23 DIAGNOSIS — C8330 Diffuse large B-cell lymphoma, unspecified site: Secondary | ICD-10-CM

## 2021-12-23 DIAGNOSIS — D649 Anemia, unspecified: Secondary | ICD-10-CM | POA: Insufficient documentation

## 2021-12-23 DIAGNOSIS — T451X5A Adverse effect of antineoplastic and immunosuppressive drugs, initial encounter: Secondary | ICD-10-CM

## 2021-12-23 DIAGNOSIS — Z452 Encounter for adjustment and management of vascular access device: Secondary | ICD-10-CM | POA: Diagnosis not present

## 2021-12-23 DIAGNOSIS — I420 Dilated cardiomyopathy: Secondary | ICD-10-CM

## 2021-12-23 LAB — CBC AND DIFFERENTIAL
HCT: 39 — AB (ref 41–53)
Hemoglobin: 12.5 — AB (ref 13.5–17.5)
Neutrophils Absolute: 5.77
Platelets: 191 10*3/uL (ref 150–400)
WBC: 7.9

## 2021-12-23 LAB — HEPATIC FUNCTION PANEL
ALT: 16 U/L (ref 10–40)
AST: 26 (ref 14–40)
Alkaline Phosphatase: 70 (ref 25–125)
Bilirubin, Total: 0.3

## 2021-12-23 LAB — BASIC METABOLIC PANEL
BUN: 17 (ref 4–21)
CO2: 26 — AB (ref 13–22)
Chloride: 107 (ref 99–108)
Creatinine: 1 (ref 0.6–1.3)
Glucose: 149
Potassium: 4.3 mEq/L (ref 3.5–5.1)
Sodium: 138 (ref 137–147)

## 2021-12-23 LAB — COMPREHENSIVE METABOLIC PANEL
Albumin: 3.8 (ref 3.5–5.0)
Calcium: 9 (ref 8.7–10.7)

## 2021-12-23 LAB — CBC: RBC: 4.39 (ref 3.87–5.11)

## 2021-12-23 LAB — LACTATE DEHYDROGENASE: LDH: 102 U/L (ref 98–192)

## 2021-12-23 MED ORDER — SODIUM CHLORIDE 0.9% FLUSH
10.0000 mL | INTRAVENOUS | Status: DC | PRN
  Administered 2021-12-23: 10 mL

## 2021-12-23 MED ORDER — HEPARIN SOD (PORK) LOCK FLUSH 100 UNIT/ML IV SOLN
500.0000 [IU] | Freq: Once | INTRAVENOUS | Status: AC | PRN
  Administered 2021-12-23: 500 [IU]

## 2021-12-23 NOTE — Progress Notes (Signed)
Cardiology Office Note:    Date:  12/23/2021   ID:  Ryan Ball, DOB 07/30/1930, MRN [id number]  PCP:  Ryan Sheriff, MD  Cardiologist:  Ryan Campus, MD    Referring MD: Ryan Sheriff, MD   Chief Complaint  Patient presents with   Follow-up    History of Present Illness:    Ryan Ball is a 88 y.o. male with past medical history significant for diffuse large B-cell lymphoma status postchemotherapy, history of cardiomyopathy with ejection fraction 45% in the summer 2021 however latest echocardiogram showed normalization of ejection fraction from 2022, he did have a stress test which showed minimal area of ischemia involving apex however he prefers conservative approach.  Also that essential hypertension and anxiety.  Comes today to my office for follow-up.  Overall he said he is doing well he developed some wound on his left fluid and he was asked to stay away from walking for few weeks he is missing his walking.  He walks on the regular basis he is getting ready to come back to that.  He described also to have some episode of chest sensation when he bends forward when he is in the shower.  He does not have any chest pain tightness squeezing pressure burning in his chest when he walks.  He described to me the fact that he does have some anxiety, he is worried about his grandson who was in [place] luckily his grandson came back and now things are getting quieter and better.  His son is trying to get out of [employer] so that gave [name] a lot of relief.  Past Medical History:  Diagnosis Date   Anemia due to antineoplastic chemotherapy 04/13/2020   Atypical chest pain 10/10/2018   Cardiomegaly 06/08/2016   Diffuse large B-cell lymphoma (Ryan Ball) 10/30/2019   Dilated cardiomyopathy (Ryan Ball) ejection fraction 45% in summer 2021 by echo 05/20/2020   Encounter for venous access device care 04/09/2020   Esophageal reflux 10/10/2018   Hypercholesteremia 10/10/2018   Postoperative  examination 12/12/2019   Senile nuclear sclerosis 05/19/2012   Senile nuclear sclerosis, bilateral 11/02/2019   Sleep disturbance 10/10/2018   SVT (supraventricular tachycardia) (Ryan Ball) 09/20/2019    Past Surgical History:  Procedure Laterality Date   CHOLECYSTECTOMY      Current Medications: Current Meds  Medication Sig   alendronate (FOSAMAX) 10 MG tablet Take 10 mg by mouth every other day.   Cyanocobalamin 1000 MCG/ML KIT Inject 1,000 mcg as directed every 30 (thirty) days. Every 4 weeks   doxepin (SINEQUAN) 25 MG capsule Take 1 capsule by mouth at bedtime as needed (Sleeping).   nitroGLYCERIN (NITROSTAT) 0.4 MG SL tablet Place 1 tablet (0.4 mg total) under the tongue every 5 (five) minutes as needed for chest pain.   rosuvastatin (CRESTOR) 5 MG tablet Take 1 tablet by mouth every other day.    Vitamin D, Ergocalciferol, (DRISDOL) 1.25 MG (50000 UNIT) CAPS capsule Take 50,000 Units by mouth every 14 (fourteen) days.     Allergies:   Patient has no known allergies.   Social History   Socioeconomic History   Marital status: Widowed    Spouse name: Not on file   Number of children: Not on file   Years of education: Not on file   Highest education level: Not on file  Occupational History   Not on file  Tobacco Use   Smoking status: Never   Smokeless tobacco: Never  Substance and Sexual Activity  Alcohol use: Yes    Alcohol/week: 6.0 standard drinks of alcohol    Types: 6 Glasses of wine per week    Comment: 1-2 glasses of wine 2-3 times a week   Drug use: Never   Sexual activity: Not on file  Other Topics Concern   Not on file  Social History Narrative   Not on file   Social Determinants of Health   Financial Resource Strain: Not on file  Food Insecurity: Not on file  Transportation Needs: Not on file  Physical Activity: Not on file  Stress: Not on file  Social Connections: Not on file     Family History: The patient's family history includes CAD in his brother  and father. ROS:   Please see the history of present illness.    All 14 point review of systems negative except as described per history of present illness  EKGs/Labs/Other Studies Reviewed:      Recent Labs: 07/22/2021: ALT 17; BUN 17; Creatinine 1.0; Hemoglobin 13.0; Platelets 190; Potassium 4.3; Sodium 139  Recent Lipid Panel No results found for: "CHOL", "TRIG", "HDL", "CHOLHDL", "VLDL", "LDLCALC", "LDLDIRECT"  Physical Exam:    VS:  BP 130/70 (BP Location: Left Arm, Patient Position: Sitting, Cuff Size: Normal)   Pulse 72   Ht _0  (1.753 m)   Wt 172 lb (78 kg)   SpO2 96%   BMI 25.40 kg/m     Wt Readings from Last 3 Encounters:  12/23/21 172 lb (78 kg)  07/22/21 171 lb 11.2 oz (77.9 kg)  06/17/21 172 lb 6.4 oz (78.2 kg)     GEN:  Well nourished, well developed in no acute distress HEENT: Normal NECK: No JVD; No carotid bruits LYMPHATICS: No lymphadenopathy CARDIAC: RRR, no murmurs, no rubs, no gallops RESPIRATORY:  Clear to auscultation without rales, wheezing or rhonchi  ABDOMEN: Soft, non-tender, non-distended MUSCULOSKELETAL:  No edema; No deformity  SKIN: Warm and dry LOWER EXTREMITIES: no swelling NEUROLOGIC:  Alert and oriented x 3 PSYCHIATRIC:  Normal affect   ASSESSMENT:    1. Dilated cardiomyopathy (HCC) ejection fraction 45% in summer 2021 by echo   2. SVT (supraventricular tachycardia) (Ryan Ball)   3. Coronary artery disease of native artery of native heart with stable angina pectoris (HCC)    PLAN:    In order of problems listed above:  Dilated cardiomyopathy time to recheck his left ventricle ejection fraction he is reluctant to add any more medications to every he is taking.  He said simply that I feel so well and when I do anything about adding medications.  We will check echocardiogram make sure everything is stable there. Supraventricular tachycardia on the monitor denies have any palpitation again does not want to take any medication for  it Coronary artery disease he gets mildly abnormal stress test does not have any symptomatology.  Again does not want to do anything about it.  He is getting ready to come back to his exercises. Overall think he is doing quite well for somebody his age.  Somewhat disappointed that he does not want to treat more aggressively he is risk factors and problems but overall he is doing quite well   Medication Adjustments/Labs and Tests Ordered: Current medicines are reviewed at length with the patient today.  Concerns regarding medicines are outlined above.  No orders of the defined types were placed in this encounter.  Medication changes: No orders of the defined types were placed in this encounter.   Signed, Marily Lente.  Agustin Cree, MD, [facility] 12/23/2021 10:26 AM    [place]

## 2021-12-23 NOTE — Progress Notes (Signed)
[facility]  [address] [place],  [place]  [address] [phone]  Clinic Day:  12/23/21  Referring physician: Angelina Sheriff, MD  ASSESSMENT & PLAN:   1. Stage II diffuse large B-cell lymphoma.  He tolerated treatment with R-mini-CHOP without significant difficulty, and completed 3 cycles at the end of August.  PET imaging revealed remission with no evidence of metabolically active lymphoma.  The mass at the tongue base has resolved.  He completed consolidation radiation in November 2021.  PET imaging from September of 2022 remains stable with no evidence of residual or recurrent disease. There was a stable single 9 mm, weakly hypermetabolic left level 1B lymph node, likely inflammatory.  The latest PET scan from July 15, 2021 shows no evidence of lymphoma recurrence.   2. Mildly impaired left ventricular systolic function with an EF between 45-50%. He continues to follow up with Dr. Agustin Cree, his cardiologist.    3. Anemia, which has worsened slightly.  Iron studies, B12 and folate will be checked today.  His hemoglobin has decreased from 13.0 to 12.5.  4.  Large hiatal hernia.  He remains in complete remission. He has mild anemia with a slight decrease in his hemoglobin so I will recheck anemia studies. His port was flushed today. I will see him back in 6 months with CBC, CMP, LDH, and CT scans of neck, chest, abdomen and pelvis. He agrees with this plan and his questions were answered. He was instructed to call sooner if concerns arise regarding his lymphoma.  I provided 25 minutes of face-to-face time during this this encounter and > 50% was spent counseling as documented under my assessment and plan.    Ryan Kaplan, MD [facility] [address] [place] [address] Dept: [phone] Dept Fax: [phone]    CHIEF COMPLAINT:  CC: History of stage IIA large  B-cell lymphoma  Current Treatment:  Surveillance   HISTORY OF PRESENT ILLNESS:  Ryan Ball is a 88 y.o. male with stage IIA diffuse large B-cell lymphoma diagnosed in June 2021.  He presented with voice changes and dysphagia.  CT imaging revealed 2.4 x 3.9 x 4.8 cm posterior tongue base mass, as well as mild bilateral cervical lymphadenopathy.  He saw Dr. Conley Canal and underwent biopsy, which revealed diffuse large B-cell lymphoma, non-germinal center phenotype, which was CD45, CD19, CD20 and CD38 positive.  PET-CT revealed hypermetabolic tongue base mass measuring 4.8 x 2.1 cm with an SUV of 15.5 with bilateral cervical lymph nodes measuring 1.5 cm with an SUV max of 11.3.  He was seen by Dr. Cassell Clement at [facility].  She recommended starting with R-mini-CHOP given every 3 weeks, increasing the doses of chemotherapy with each cycle as tolerated.  Prior to chemotherapy he had an echocardiogram, which revealed mild concentric left ventricular hypertrophy, with mild global hypokinesis of the left ventricle and overall mildly impaired left ventricular systolic function with an ejection fraction of 45-50%.  He had his 1st cycle of R-mini-CHOP, with the chemotherapy given at 50% dose reduction, on July 12th. He received his second cycle at 75% dose and a third cycle at 90%. PET imaging from September 2021 revealed no evidence of metabolically active lymphoma within the neck, chest, abdomen or pelvis.  The previously demonstrated mass at the base of the tongue has resolved.  He was referred for involved field radiation for consolidation.  PET imaging from  February 2022 revealed a stable exam with no evidence for metabolically active lymphoma in the neck, chest, abdomen, or pelvis.  He had a skin cancer removed at Dr. Jimmye Norman office, and underwent Mohs surgery of the right lower leg.  INTERVAL HISTORY:  I have reviewed his chart and materials related to his cancer extensively and  collaborated history with the patient. Summary of oncologic history is as follows: Oncology History  Diffuse large B-cell lymphoma ([place])  10/19/2019 Cancer Staging   Staging form: Hodgkin and Non-Hodgkin Lymphoma, AJCC 8th Edition - Clinical stage from 10/19/2019: Stage II (Diffuse large B-cell lymphoma) - Signed by Ryan Kaplan, MD on 04/13/2020 Prognostic indicators: 4.8 cm mass of posterior tongue base, CR on PET after 3 cycles R-CHOP   10/30/2019 Initial Diagnosis   Diffuse large B-cell lymphoma ([facility])   EXAM: 07/15/21 NUCLEAR MEDICINE PET SKULL BASE TO THIGH  IMPRESSION:  No evidence of lymphoma recurrence.  Stable incidental findings as above.   INTERVAL HISTORY:  Ryan Ball is here for routine follow up and he had requested we wait until the end of the summer, which he has thoroughly enjoyed.[facility] PET imaging from March 8th revealed no evidence of recurrent lymphoma.  He has incidental findings of extensive left colon diverticulosis, large hiatal hernia, and presence of a port.This was flushed today and used to obtain his labs.  CBC reveals a slight decrease in his hemoglobin from 13.0 to 12.5, and CMP are unremarkable.I will recheck anemia studies.  His  appetite is good, and his weight is stable since his last visit.  He denies fever, chills or other signs of infection.  He denies nausea, vomiting, bowel issues, or abdominal pain.  He denies sore throat, cough, dyspnea, or chest pain.  He denies any falls or pain.  HISTORY:   Allergies: No Known Allergies  Current Medications: Current Outpatient Medications  Medication Sig Dispense Refill   alendronate (FOSAMAX) 10 MG tablet Take 10 mg by mouth every other day.     Cyanocobalamin 1000 MCG/ML KIT Inject 1,000 mcg as directed every 30 (thirty) days. Every 4 weeks     doxepin (SINEQUAN) 25 MG capsule Take 1 capsule by mouth at bedtime as needed (Sleeping).     nitroGLYCERIN (NITROSTAT) 0.4 MG SL tablet Place 1 tablet (0.4 mg total)  under the tongue every 5 (five) minutes as needed for chest pain. 90 tablet 3   rosuvastatin (CRESTOR) 5 MG tablet Take 1 tablet by mouth every other day.      Vitamin D, Ergocalciferol, (DRISDOL) 1.25 MG (50000 UNIT) CAPS capsule Take 50,000 Units by mouth every 14 (fourteen) days.     Current Facility-Administered Medications  Medication Dose Route Frequency Provider Last Rate Last Admin   sodium chloride flush (NS) 0.9 % injection 10 mL  10 mL Intracatheter PRN [place] Scrape A, NP   10 mL at 12/23/21 1336    REVIEW OF SYSTEMS:  Review of Systems  Constitutional: Negative.   HENT:  Negative.    Eyes: Negative.   Respiratory: Negative.    Cardiovascular: Negative.   Gastrointestinal: Negative.   Endocrine: Negative.   Genitourinary: Negative.    Musculoskeletal: Negative.   Skin: Negative.   Neurological: Negative.   Hematological: Negative.   Psychiatric/Behavioral: Negative.        VITALS:  Blood pressure (!) 148/70, pulse 75, temperature (!) 97.4 F (36.3 C), temperature source Oral, resp. rate 18, height _0  (1.753 m), weight 173 lb 4.8 oz (78.6 kg), SpO2 99 %.  Wt Readings from Last 3 Encounters:  12/23/21 173 lb 4.8 oz (78.6 kg)  12/23/21 172 lb (78 kg)  07/22/21 171 lb 11.2 oz (77.9 kg)    Body mass index is 25.59 kg/m.  Performance status (ECOG): 0 - Asymptomatic  PHYSICAL EXAM:  Physical Exam Constitutional:      Appearance: Normal appearance. He is normal weight.  HENT:     Head: Normocephalic and atraumatic.     Ears:     Comments: Hard of hearing    Nose: Nose normal.     Mouth/Throat:     Mouth: Mucous membranes are moist.     Pharynx: Oropharynx is clear.  Eyes:     Extraocular Movements: Extraocular movements intact.     Pupils: Pupils are equal, round, and reactive to light.  Cardiovascular:     Rate and Rhythm: Normal rate and regular rhythm.     Heart sounds: Normal heart sounds.  Pulmonary:     Effort: Pulmonary effort is normal.      Breath sounds: Normal breath sounds.  Abdominal:     General: Bowel sounds are normal.     Palpations: Abdomen is soft.  Musculoskeletal:        General: Normal range of motion.     Cervical back: Normal range of motion.  Skin:    General: Skin is warm and dry.  Neurological:     General: No focal deficit present.     Mental Status: He is alert and oriented to person, place, and time.  Psychiatric:        Mood and Affect: Mood normal.        Behavior: Behavior normal.        Thought Content: Thought content normal.        Judgment: Judgment normal.     LABS:      Latest Ref Rng & Units 12/23/2021   12:00 AM 07/22/2021   12:00 AM 04/23/2021   12:00 AM  CBC  WBC  7.9     7.6     5.9   Hemoglobin 13.5 - 17.5 12.5     13.0     12.7   Hematocrit 41 - 53 39     41     38   Platelets 150 - 400 K/uL 191     190     178      This result is from an external source.      Latest Ref Rng & Units 12/23/2021   12:00 AM 07/22/2021   12:00 AM 04/23/2021   12:00 AM  CMP  BUN 4 - _0 Creatinine 0.6 - 1.3 1.0     1.0     1.0   Sodium 137 - 147 138     139     139   Potassium 3.5 - 5.1 mEq/L 4.3     4.3     4.4   Chloride 99 - 108 107     108     105   CO2 13 - _1 Calcium 8.7 - 10.7 9.0     9.1     8.9   Alkaline Phos 25 - 125 70     66     65   AST 14 - 40 26     26     31  ALT 10 - 40 U/L _0 This result is from an external source.    Lab Results  Component Value Date   TIBC 312 12/24/2021   TIBC 334 01/22/2021   FERRITIN 29 12/24/2021   FERRITIN 48 01/22/2021   IRONPCTSAT 27 12/24/2021   IRONPCTSAT 34 01/22/2021   Lab Results  Component Value Date   LDH 102 12/23/2021   LDH 125 07/22/2021   LDH 119 04/23/2021    STUDIES:    EXAM: 07/15/21 NUCLEAR MEDICINE PET SKULL BASE TO THIGH   TECHNIQUE:  14.55 mCi F-18 FDG was injected intravenously. Full-ring PET imaging  was performed from the skull base to thigh  after the radiotracer. CT  data was obtained and used for attenuation correction and anatomic  localization.  Fasting blood glucose: 108 mg/dl  COMPARISON: PET-CT 07/07/2020  FINDINGS:  Mediastinal blood pool activity: SUV max 1.89  NECK: No hypermetabolic lymph nodes in the neck.  Incidental CT findings: none  CHEST: No hypermetabolic mediastinal or hilar nodes. No suspicious  pulmonary nodules on the CT scan.  Incidental CT findings: Port in the anterior chest wall with tip in  distal SVC. Large hiatal hernia.  ABDOMEN/PELVIS: No abnormal hypermetabolic activity within the  liver, pancreas, adrenal glands, or spleen. No hypermetabolic lymph  nodes in the abdomen or pelvis.  Incidental CT findings: Extensive LEFT colon diverticulosis. No  diverticulitis. Postcholecystectomy. Atherosclerotic calcification  of the aorta.  SKELETON: No focal hypermetabolic activity to suggest skeletal  metastasis.  Incidental CT findings: none   IMPRESSION:  No evidence of lymphoma recurrence.  Stable incidental findings as above.  Electronically Signed  By: Suzy Bouchard M.D.  On: 07/15/2021 14:51

## 2021-12-23 NOTE — Addendum Note (Signed)
Addended by: Edwyna Shell I on: 12/23/2021 10:36 AM   Modules accepted: Orders

## 2021-12-23 NOTE — Patient Instructions (Signed)
Medication Instructions:  Your physician recommends that you continue on your current medications as directed. Please refer to the Current Medication list given to you today.  *If you need a refill on your cardiac medications before your next appointment, please call your pharmacy*   Lab Work: None If you have labs (blood work) drawn today and your tests are completely normal, you will receive your results only by: [facility] (if you have MyChart) OR A paper copy in the mail If you have any lab test that is abnormal or we need to change your treatment, we will call you to review the results.   Testing/Procedures: Your physician has requested that you have an echocardiogram. Echocardiography is a painless test that uses sound waves to create images of your heart. It provides your doctor with information about the size and shape of your heart and how well your heart's chambers and valves are working. This procedure takes approximately one hour. There are no restrictions for this procedure.    Follow-Up: At [facility], you and your health needs are our priority.  As part of our continuing mission to provide you with exceptional heart care, we have created designated Provider Care Teams.  These Care Teams include your primary Cardiologist (physician) and Advanced Practice Providers (APPs -  Physician Assistants and Nurse Practitioners) who all work together to provide you with the care you need, when you need it.  We recommend signing up for the patient portal called "MyChart".  Sign up information is provided on this After Visit Summary.  MyChart is used to connect with patients for Virtual Visits (Telemedicine).  Patients are able to view lab/test results, encounter notes, upcoming appointments, etc.  Non-urgent messages can be sent to your provider as well.   To learn more about what you can do with MyChart, go to [email or web address].    Your next appointment:   6  month(s)  The format for your next appointment:   In Person  Provider:   Jenne Campus, MD    Other Instructions None  Important Information About Sugar

## 2021-12-24 DIAGNOSIS — Z452 Encounter for adjustment and management of vascular access device: Secondary | ICD-10-CM | POA: Diagnosis not present

## 2021-12-24 DIAGNOSIS — D649 Anemia, unspecified: Secondary | ICD-10-CM | POA: Diagnosis not present

## 2021-12-24 DIAGNOSIS — C8331 Diffuse large B-cell lymphoma, lymph nodes of head, face, and neck: Secondary | ICD-10-CM | POA: Diagnosis not present

## 2021-12-24 LAB — IRON AND TIBC
Iron: 84 ug/dL (ref 45–182)
Saturation Ratios: 27 % (ref 17.9–39.5)
TIBC: 312 ug/dL (ref 250–450)
UIBC: 228 ug/dL

## 2021-12-24 LAB — VITAMIN B12: Vitamin B-12: 325 pg/mL (ref 180–914)

## 2021-12-24 LAB — FOLATE: Folate: 11.8 ng/mL (ref >5.9)

## 2021-12-24 LAB — FERRITIN: Ferritin: 29 ng/mL (ref 24–336)

## 2021-12-26 LAB — HAPTOGLOBIN: Haptoglobin: 148 mg/dL (ref 38–329)

## 2021-12-28 DIAGNOSIS — D519 Vitamin B12 deficiency anemia, unspecified: Secondary | ICD-10-CM | POA: Diagnosis not present

## 2021-12-31 ENCOUNTER — Other Ambulatory Visit: Payer: Medicare Other

## 2022-01-12 ENCOUNTER — Other Ambulatory Visit: Payer: Medicare Other

## 2022-01-13 DIAGNOSIS — D485 Neoplasm of uncertain behavior of skin: Secondary | ICD-10-CM | POA: Diagnosis not present

## 2022-01-13 DIAGNOSIS — L570 Actinic keratosis: Secondary | ICD-10-CM | POA: Diagnosis not present

## 2022-01-17 ENCOUNTER — Encounter: Payer: Self-pay | Admitting: Oncology

## 2022-01-18 DIAGNOSIS — Z6826 Body mass index (BMI) 26.0-26.9, adult: Secondary | ICD-10-CM | POA: Diagnosis not present

## 2022-01-18 DIAGNOSIS — Z8572 Personal history of non-Hodgkin lymphomas: Secondary | ICD-10-CM | POA: Diagnosis not present

## 2022-01-18 DIAGNOSIS — D510 Vitamin B12 deficiency anemia due to intrinsic factor deficiency: Secondary | ICD-10-CM | POA: Diagnosis not present

## 2022-01-18 DIAGNOSIS — Z1331 Encounter for screening for depression: Secondary | ICD-10-CM | POA: Diagnosis not present

## 2022-01-19 ENCOUNTER — Ambulatory Visit: Payer: Medicare Other | Attending: Cardiology

## 2022-01-19 DIAGNOSIS — I471 Supraventricular tachycardia: Secondary | ICD-10-CM | POA: Diagnosis not present

## 2022-01-19 DIAGNOSIS — I25118 Atherosclerotic heart disease of native coronary artery with other forms of angina pectoris: Secondary | ICD-10-CM

## 2022-01-19 DIAGNOSIS — I420 Dilated cardiomyopathy: Secondary | ICD-10-CM | POA: Diagnosis not present

## 2022-01-19 LAB — ECHOCARDIOGRAM COMPLETE
AR max vel: 1.33 cm2
AV Area VTI: 1.34 cm2
AV Area mean vel: 1.36 cm2
AV Mean grad: 9 mmHg
AV Peak grad: 15.1 mmHg
Ao pk vel: 1.94 m/s
Area-P 1/2: 2.62 cm2
MV M vel: 6.03 m/s
MV Peak grad: 145.4 mmHg
S' Lateral: 3.8 cm

## 2022-01-21 ENCOUNTER — Telehealth: Payer: Self-pay

## 2022-01-21 NOTE — Telephone Encounter (Signed)
-----   Message from Park Liter, MD sent at 01/20/2022 12:54 PM EDT ----- Ejection fraction low normal, otherwise everything looks good, continue present management

## 2022-01-21 NOTE — Telephone Encounter (Signed)
Patient notified of results.

## 2022-02-01 DIAGNOSIS — K644 Residual hemorrhoidal skin tags: Secondary | ICD-10-CM | POA: Diagnosis not present

## 2022-02-01 DIAGNOSIS — I7090 Unspecified atherosclerosis: Secondary | ICD-10-CM | POA: Diagnosis not present

## 2022-02-09 DIAGNOSIS — D519 Vitamin B12 deficiency anemia, unspecified: Secondary | ICD-10-CM | POA: Diagnosis not present

## 2022-02-26 DIAGNOSIS — Z23 Encounter for immunization: Secondary | ICD-10-CM | POA: Diagnosis not present

## 2022-03-01 DIAGNOSIS — D519 Vitamin B12 deficiency anemia, unspecified: Secondary | ICD-10-CM | POA: Diagnosis not present

## 2022-03-09 DIAGNOSIS — M8580 Other specified disorders of bone density and structure, unspecified site: Secondary | ICD-10-CM | POA: Diagnosis not present

## 2022-03-09 DIAGNOSIS — Z6826 Body mass index (BMI) 26.0-26.9, adult: Secondary | ICD-10-CM | POA: Diagnosis not present

## 2022-03-09 DIAGNOSIS — K1230 Oral mucositis (ulcerative), unspecified: Secondary | ICD-10-CM | POA: Diagnosis not present

## 2022-03-22 DIAGNOSIS — D519 Vitamin B12 deficiency anemia, unspecified: Secondary | ICD-10-CM | POA: Diagnosis not present

## 2022-03-23 DIAGNOSIS — D485 Neoplasm of uncertain behavior of skin: Secondary | ICD-10-CM | POA: Diagnosis not present

## 2022-03-23 DIAGNOSIS — L300 Nummular dermatitis: Secondary | ICD-10-CM | POA: Diagnosis not present

## 2022-04-16 DIAGNOSIS — I7090 Unspecified atherosclerosis: Secondary | ICD-10-CM | POA: Diagnosis not present

## 2022-04-16 DIAGNOSIS — L049 Acute lymphadenitis, unspecified: Secondary | ICD-10-CM | POA: Diagnosis not present

## 2022-04-16 DIAGNOSIS — Z6826 Body mass index (BMI) 26.0-26.9, adult: Secondary | ICD-10-CM | POA: Diagnosis not present

## 2022-04-20 DIAGNOSIS — J029 Acute pharyngitis, unspecified: Secondary | ICD-10-CM | POA: Diagnosis not present

## 2022-04-20 DIAGNOSIS — S51011A Laceration without foreign body of right elbow, initial encounter: Secondary | ICD-10-CM | POA: Diagnosis not present

## 2022-04-20 DIAGNOSIS — J984 Other disorders of lung: Secondary | ICD-10-CM | POA: Diagnosis not present

## 2022-04-20 DIAGNOSIS — S81811A Laceration without foreign body, right lower leg, initial encounter: Secondary | ICD-10-CM | POA: Diagnosis not present

## 2022-04-20 DIAGNOSIS — T148XXA Other injury of unspecified body region, initial encounter: Secondary | ICD-10-CM | POA: Diagnosis not present

## 2022-04-20 DIAGNOSIS — M79606 Pain in leg, unspecified: Secondary | ICD-10-CM | POA: Diagnosis not present

## 2022-04-20 DIAGNOSIS — S40011A Contusion of right shoulder, initial encounter: Secondary | ICD-10-CM | POA: Diagnosis not present

## 2022-04-20 DIAGNOSIS — M25521 Pain in right elbow: Secondary | ICD-10-CM | POA: Diagnosis not present

## 2022-04-20 DIAGNOSIS — M79643 Pain in unspecified hand: Secondary | ICD-10-CM | POA: Diagnosis not present

## 2022-04-20 DIAGNOSIS — M79661 Pain in right lower leg: Secondary | ICD-10-CM | POA: Diagnosis not present

## 2022-04-20 DIAGNOSIS — W19XXXA Unspecified fall, initial encounter: Secondary | ICD-10-CM | POA: Diagnosis not present

## 2022-04-20 DIAGNOSIS — S4991XA Unspecified injury of right shoulder and upper arm, initial encounter: Secondary | ICD-10-CM | POA: Diagnosis not present

## 2022-04-20 DIAGNOSIS — S8011XA Contusion of right lower leg, initial encounter: Secondary | ICD-10-CM | POA: Diagnosis not present

## 2022-04-23 DIAGNOSIS — Z6826 Body mass index (BMI) 26.0-26.9, adult: Secondary | ICD-10-CM | POA: Diagnosis not present

## 2022-04-23 DIAGNOSIS — T07XXXA Unspecified multiple injuries, initial encounter: Secondary | ICD-10-CM | POA: Diagnosis not present

## 2022-04-23 DIAGNOSIS — J029 Acute pharyngitis, unspecified: Secondary | ICD-10-CM | POA: Diagnosis not present

## 2022-04-23 DIAGNOSIS — R1310 Dysphagia, unspecified: Secondary | ICD-10-CM | POA: Diagnosis not present

## 2022-04-24 DIAGNOSIS — I7090 Unspecified atherosclerosis: Secondary | ICD-10-CM | POA: Diagnosis not present

## 2022-04-24 DIAGNOSIS — S81801D Unspecified open wound, right lower leg, subsequent encounter: Secondary | ICD-10-CM | POA: Diagnosis not present

## 2022-04-24 DIAGNOSIS — R1310 Dysphagia, unspecified: Secondary | ICD-10-CM | POA: Diagnosis not present

## 2022-04-24 DIAGNOSIS — S51001D Unspecified open wound of right elbow, subsequent encounter: Secondary | ICD-10-CM | POA: Diagnosis not present

## 2022-04-24 DIAGNOSIS — Z6826 Body mass index (BMI) 26.0-26.9, adult: Secondary | ICD-10-CM | POA: Diagnosis not present

## 2022-04-24 DIAGNOSIS — S81009D Unspecified open wound, unspecified knee, subsequent encounter: Secondary | ICD-10-CM | POA: Diagnosis not present

## 2022-04-24 DIAGNOSIS — D510 Vitamin B12 deficiency anemia due to intrinsic factor deficiency: Secondary | ICD-10-CM | POA: Diagnosis not present

## 2022-04-24 DIAGNOSIS — Z8572 Personal history of non-Hodgkin lymphomas: Secondary | ICD-10-CM | POA: Diagnosis not present

## 2022-04-24 DIAGNOSIS — E7800 Pure hypercholesterolemia, unspecified: Secondary | ICD-10-CM | POA: Diagnosis not present

## 2022-04-24 DIAGNOSIS — S21109D Unspecified open wound of unspecified front wall of thorax without penetration into thoracic cavity, subsequent encounter: Secondary | ICD-10-CM | POA: Diagnosis not present

## 2022-04-24 DIAGNOSIS — M8580 Other specified disorders of bone density and structure, unspecified site: Secondary | ICD-10-CM | POA: Diagnosis not present

## 2022-04-24 DIAGNOSIS — E669 Obesity, unspecified: Secondary | ICD-10-CM | POA: Diagnosis not present

## 2022-04-24 DIAGNOSIS — T07XXXD Unspecified multiple injuries, subsequent encounter: Secondary | ICD-10-CM | POA: Diagnosis not present

## 2022-04-24 DIAGNOSIS — I700 Atherosclerosis of aorta: Secondary | ICD-10-CM | POA: Diagnosis not present

## 2022-04-24 DIAGNOSIS — K219 Gastro-esophageal reflux disease without esophagitis: Secondary | ICD-10-CM | POA: Diagnosis not present

## 2022-04-27 DIAGNOSIS — S21109D Unspecified open wound of unspecified front wall of thorax without penetration into thoracic cavity, subsequent encounter: Secondary | ICD-10-CM | POA: Diagnosis not present

## 2022-04-27 DIAGNOSIS — T07XXXD Unspecified multiple injuries, subsequent encounter: Secondary | ICD-10-CM | POA: Diagnosis not present

## 2022-04-27 DIAGNOSIS — S81801D Unspecified open wound, right lower leg, subsequent encounter: Secondary | ICD-10-CM | POA: Diagnosis not present

## 2022-04-27 DIAGNOSIS — S81009D Unspecified open wound, unspecified knee, subsequent encounter: Secondary | ICD-10-CM | POA: Diagnosis not present

## 2022-04-27 DIAGNOSIS — S51001D Unspecified open wound of right elbow, subsequent encounter: Secondary | ICD-10-CM | POA: Diagnosis not present

## 2022-04-27 DIAGNOSIS — R1310 Dysphagia, unspecified: Secondary | ICD-10-CM | POA: Diagnosis not present

## 2022-04-30 DIAGNOSIS — S81801D Unspecified open wound, right lower leg, subsequent encounter: Secondary | ICD-10-CM | POA: Diagnosis not present

## 2022-04-30 DIAGNOSIS — T07XXXD Unspecified multiple injuries, subsequent encounter: Secondary | ICD-10-CM | POA: Diagnosis not present

## 2022-04-30 DIAGNOSIS — S21109D Unspecified open wound of unspecified front wall of thorax without penetration into thoracic cavity, subsequent encounter: Secondary | ICD-10-CM | POA: Diagnosis not present

## 2022-04-30 DIAGNOSIS — R1310 Dysphagia, unspecified: Secondary | ICD-10-CM | POA: Diagnosis not present

## 2022-04-30 DIAGNOSIS — S51001D Unspecified open wound of right elbow, subsequent encounter: Secondary | ICD-10-CM | POA: Diagnosis not present

## 2022-04-30 DIAGNOSIS — S81009D Unspecified open wound, unspecified knee, subsequent encounter: Secondary | ICD-10-CM | POA: Diagnosis not present

## 2022-05-05 ENCOUNTER — Telehealth: Payer: Self-pay | Admitting: Hematology and Oncology

## 2022-05-05 DIAGNOSIS — S51001D Unspecified open wound of right elbow, subsequent encounter: Secondary | ICD-10-CM | POA: Diagnosis not present

## 2022-05-05 DIAGNOSIS — S81801D Unspecified open wound, right lower leg, subsequent encounter: Secondary | ICD-10-CM | POA: Diagnosis not present

## 2022-05-05 DIAGNOSIS — R1310 Dysphagia, unspecified: Secondary | ICD-10-CM | POA: Diagnosis not present

## 2022-05-05 DIAGNOSIS — S21109D Unspecified open wound of unspecified front wall of thorax without penetration into thoracic cavity, subsequent encounter: Secondary | ICD-10-CM | POA: Diagnosis not present

## 2022-05-05 DIAGNOSIS — T07XXXD Unspecified multiple injuries, subsequent encounter: Secondary | ICD-10-CM | POA: Diagnosis not present

## 2022-05-05 DIAGNOSIS — S81009D Unspecified open wound, unspecified knee, subsequent encounter: Secondary | ICD-10-CM | POA: Diagnosis not present

## 2022-05-07 ENCOUNTER — Encounter: Payer: Self-pay | Admitting: Oncology

## 2022-05-07 DIAGNOSIS — S51001D Unspecified open wound of right elbow, subsequent encounter: Secondary | ICD-10-CM | POA: Diagnosis not present

## 2022-05-07 DIAGNOSIS — S21109D Unspecified open wound of unspecified front wall of thorax without penetration into thoracic cavity, subsequent encounter: Secondary | ICD-10-CM | POA: Diagnosis not present

## 2022-05-07 DIAGNOSIS — R1310 Dysphagia, unspecified: Secondary | ICD-10-CM | POA: Diagnosis not present

## 2022-05-07 DIAGNOSIS — S81801D Unspecified open wound, right lower leg, subsequent encounter: Secondary | ICD-10-CM | POA: Diagnosis not present

## 2022-05-07 DIAGNOSIS — T07XXXD Unspecified multiple injuries, subsequent encounter: Secondary | ICD-10-CM | POA: Diagnosis not present

## 2022-05-07 DIAGNOSIS — S81009D Unspecified open wound, unspecified knee, subsequent encounter: Secondary | ICD-10-CM | POA: Diagnosis not present

## 2022-05-07 NOTE — Telephone Encounter (Signed)
Patient returned the phone call and is scheduled for CT imaging on 1/4 and follow-up with Dr. Hinton Rao on 1/8

## 2022-05-07 NOTE — Telephone Encounter (Deleted)
Patient returned call and is scheduled for CT imaging on 1 4 and follow-up with Dr. Hinton Rao on

## 2022-05-10 ENCOUNTER — Encounter: Payer: Self-pay | Admitting: Oncology

## 2022-05-11 DIAGNOSIS — S21109D Unspecified open wound of unspecified front wall of thorax without penetration into thoracic cavity, subsequent encounter: Secondary | ICD-10-CM | POA: Diagnosis not present

## 2022-05-11 DIAGNOSIS — S51001D Unspecified open wound of right elbow, subsequent encounter: Secondary | ICD-10-CM | POA: Diagnosis not present

## 2022-05-11 DIAGNOSIS — R1310 Dysphagia, unspecified: Secondary | ICD-10-CM | POA: Diagnosis not present

## 2022-05-11 DIAGNOSIS — T07XXXD Unspecified multiple injuries, subsequent encounter: Secondary | ICD-10-CM | POA: Diagnosis not present

## 2022-05-11 DIAGNOSIS — S81009D Unspecified open wound, unspecified knee, subsequent encounter: Secondary | ICD-10-CM | POA: Diagnosis not present

## 2022-05-11 DIAGNOSIS — S81801D Unspecified open wound, right lower leg, subsequent encounter: Secondary | ICD-10-CM | POA: Diagnosis not present

## 2022-05-13 DIAGNOSIS — S2241XA Multiple fractures of ribs, right side, initial encounter for closed fracture: Secondary | ICD-10-CM | POA: Diagnosis not present

## 2022-05-13 DIAGNOSIS — N200 Calculus of kidney: Secondary | ICD-10-CM | POA: Diagnosis not present

## 2022-05-13 DIAGNOSIS — C8331 Diffuse large B-cell lymphoma, lymph nodes of head, face, and neck: Secondary | ICD-10-CM | POA: Diagnosis not present

## 2022-05-13 DIAGNOSIS — C8330 Diffuse large B-cell lymphoma, unspecified site: Secondary | ICD-10-CM | POA: Diagnosis not present

## 2022-05-13 DIAGNOSIS — M47812 Spondylosis without myelopathy or radiculopathy, cervical region: Secondary | ICD-10-CM | POA: Diagnosis not present

## 2022-05-13 DIAGNOSIS — K5730 Diverticulosis of large intestine without perforation or abscess without bleeding: Secondary | ICD-10-CM | POA: Diagnosis not present

## 2022-05-13 DIAGNOSIS — C8590 Non-Hodgkin lymphoma, unspecified, unspecified site: Secondary | ICD-10-CM | POA: Diagnosis not present

## 2022-05-13 DIAGNOSIS — K110 Atrophy of salivary gland: Secondary | ICD-10-CM | POA: Diagnosis not present

## 2022-05-13 DIAGNOSIS — I700 Atherosclerosis of aorta: Secondary | ICD-10-CM | POA: Diagnosis not present

## 2022-05-13 DIAGNOSIS — K449 Diaphragmatic hernia without obstruction or gangrene: Secondary | ICD-10-CM | POA: Diagnosis not present

## 2022-05-13 DIAGNOSIS — I6523 Occlusion and stenosis of bilateral carotid arteries: Secondary | ICD-10-CM | POA: Diagnosis not present

## 2022-05-13 LAB — CBC AND DIFFERENTIAL
EOS%: 3 %
HCT: 40 — AB (ref 41–53)
Hemoglobin: 13.1 — AB (ref 13.5–17.5)
LYMPH%: 14 %
MCV: 87 (ref 80–94)
MONO%: 12 %
NEUT%: 71 %
Neutrophils Absolute: 5.89
Platelets: 206 10*3/uL (ref 150–400)
WBC: 8.3

## 2022-05-13 LAB — BASIC METABOLIC PANEL
BUN: 10 (ref 4–21)
CO2: 23 — AB (ref 13–22)
Chloride: 107 (ref 99–108)
Creatinine: 0.8 (ref 0.6–1.3)
Glucose: 108
Potassium: 4.5 mEq/L (ref 3.5–5.1)
Sodium: 136 — AB (ref 137–147)

## 2022-05-13 LAB — IRON,TIBC AND FERRITIN PANEL
%SAT: 39
Ferritin: 68.4
Iron: 110
TIBC: 282

## 2022-05-13 LAB — COMPREHENSIVE METABOLIC PANEL
Albumin: 3.7 (ref 3.5–5.0)
Calcium: 8.7 (ref 8.7–10.7)

## 2022-05-13 LAB — VITAMIN B12: Vitamin B-12: 816

## 2022-05-13 LAB — CBC: RBC: 4.54 (ref 3.87–5.11)

## 2022-05-13 LAB — HEPATIC FUNCTION PANEL
ALT: 18 U/L (ref 10–40)
AST: 38 (ref 14–40)
Alkaline Phosphatase: 69 (ref 25–125)
Bilirubin, Total: 0.7

## 2022-05-14 ENCOUNTER — Other Ambulatory Visit: Payer: Self-pay | Admitting: Hematology and Oncology

## 2022-05-14 DIAGNOSIS — R1310 Dysphagia, unspecified: Secondary | ICD-10-CM | POA: Diagnosis not present

## 2022-05-14 DIAGNOSIS — S81801D Unspecified open wound, right lower leg, subsequent encounter: Secondary | ICD-10-CM | POA: Diagnosis not present

## 2022-05-14 DIAGNOSIS — S51001D Unspecified open wound of right elbow, subsequent encounter: Secondary | ICD-10-CM | POA: Diagnosis not present

## 2022-05-14 DIAGNOSIS — T07XXXD Unspecified multiple injuries, subsequent encounter: Secondary | ICD-10-CM | POA: Diagnosis not present

## 2022-05-14 DIAGNOSIS — S21109D Unspecified open wound of unspecified front wall of thorax without penetration into thoracic cavity, subsequent encounter: Secondary | ICD-10-CM | POA: Diagnosis not present

## 2022-05-14 DIAGNOSIS — S81009D Unspecified open wound, unspecified knee, subsequent encounter: Secondary | ICD-10-CM | POA: Diagnosis not present

## 2022-05-14 LAB — FOLATE: Folate: 17.7 (ref 2.76–?)

## 2022-05-17 ENCOUNTER — Encounter: Payer: Self-pay | Admitting: Oncology

## 2022-05-17 ENCOUNTER — Inpatient Hospital Stay: Payer: Medicare Other | Attending: Oncology | Admitting: Oncology

## 2022-05-17 ENCOUNTER — Other Ambulatory Visit: Payer: Self-pay | Admitting: Oncology

## 2022-05-17 VITALS — BP 128/69 | HR 70 | Temp 97.4°F | Resp 20 | Ht 69.0 in | Wt 166.8 lb

## 2022-05-17 DIAGNOSIS — C8331 Diffuse large B-cell lymphoma, lymph nodes of head, face, and neck: Secondary | ICD-10-CM | POA: Diagnosis not present

## 2022-05-17 NOTE — Progress Notes (Addendum)
[facility]  [address] [place],  [place]  [address] [phone]  Clinic Day:  05/17/22   Referring physician: Angelina Sheriff, MD  ASSESSMENT & PLAN:   1. Stage II diffuse large B-cell lymphoma.  He tolerated treatment with R-mini-CHOP without significant difficulty, and completed 3 cycles at the end of August.  PET imaging revealed remission with no evidence of metabolically active lymphoma.  The mass at the tongue base has resolved.  He completed consolidation radiation in November 2021.  PET imaging from September of 2022 remains stable with no evidence of residual or recurrent disease. There was a stable single 9 mm, weakly hypermetabolic left level 1B lymph node, likely inflammatory.  The latest PET scan from July 15, 2021 shows no evidence of lymphoma recurrence. CT scans from January 2024 show no evidence of recurrent lymphoma. He remains in complete remission.   2. Mildly impaired left ventricular systolic function with an EF between 45-50%. He continues to follow up with Dr. Agustin Cree, his cardiologist.    3. Anemia, which is stable.  Iron studies, B12 and folate were all normal.  His hemoglobin has decreased from 13.0 to 12.5, and now 13.1.  4. Large hiatal hernia.   Plan He remains in complete remission. He will have a port flush in 3 months and I will see him back in 6 months on 11/15/22 with CBC, CMP, and LDH and another port flush. Since he is over 2 1/2 years out, we can probably go to yearly scans unless there is a reason to do sooner. He agrees with this plan and his questions were answered. He was instructed to call sooner if concerns arise regarding his lymphoma.  I provided 30 minutes of face-to-face time during this this encounter and > 50% was spent counseling as documented under my assessment and plan.    [name], MD [place] [address] Dept: [phone] Dept Fax: [phone]    CHIEF COMPLAINT:  CC: History of stage IIA large B-cell lymphoma  Current Treatment:  Surveillance   HISTORY OF PRESENT ILLNESS:  Ryan Ball is a 89 y.o. male with stage IIA diffuse large B-cell lymphoma diagnosed in June 2021.  He presented with voice changes and dysphagia.  CT imaging revealed 2.4 x 3.9 x 4.8 cm posterior tongue base mass, as well as mild bilateral cervical lymphadenopathy.  He saw Dr. Conley Canal and underwent biopsy, which revealed diffuse large B-cell lymphoma, non-germinal center phenotype, which was CD45, CD19, CD20 and CD38 positive.  PET-CT revealed hypermetabolic tongue base mass measuring 4.8 x 2.1 cm with an SUV of 15.5 with bilateral cervical lymph nodes measuring 1.5 cm with an SUV max of 11.3.  He was seen by Dr. Cassell Clement at [facility].  She recommended starting with R-mini-CHOP given every 3 weeks, increasing the doses of chemotherapy with each cycle as tolerated.  Prior to chemotherapy he had an echocardiogram, which revealed mild concentric left ventricular hypertrophy, with mild global hypokinesis of the left ventricle and overall mildly impaired left ventricular systolic function with an ejection fraction of 45-50%.  He had his 1st cycle of R-mini-CHOP, with the chemotherapy given at 50% dose reduction, on July 12th. He received his second cycle at 75% dose and a third cycle at 90%. PET imaging from September 2021 revealed no evidence of metabolically active lymphoma within the neck, chest, abdomen or  pelvis.  The previously demonstrated mass at the base of the tongue has resolved.  He was referred for involved field radiation for consolidation.  PET imaging from February 2022 revealed a stable exam with no evidence for metabolically active lymphoma in the neck, chest, abdomen, or pelvis.  He had a skin cancer removed at Dr. Jimmye Norman office, and underwent Mohs surgery of the  right lower leg.  INTERVAL HISTORY:  I have reviewed his chart and materials related to his cancer extensively and collaborated history with the patient. Summary of oncologic history is as follows: Oncology History  Diffuse large B-cell lymphoma ([place])  10/19/2019 Cancer Staging   Staging form: Hodgkin and Non-Hodgkin Lymphoma, AJCC 8th Edition - Clinical stage from 10/19/2019: Stage II (Diffuse large B-cell lymphoma) - Signed by [name], MD on 04/13/2020 Prognostic indicators: 4.8 cm mass of posterior tongue base, CR on PET after 3 cycles R-CHOP   10/30/2019 Initial Diagnosis   Diffuse large B-cell lymphoma ([place])    INTERVAL HISTORY:  Ryan Ball is here for routine follow up for stage IIA large B-cell lymphoma diagnosed in June, 2021. He states that he is feeling well. However, he did fall recently when cleaning his dresser about 1 month ago, but seems to be doing good. His neck, chest, abdomen, and pelvis scans all look good. It does show that he fractured his 7th rib, in the same spot that he hit with the dresser He has a large hiatal hernia, and multilevel degenerative changes of the spine with kyphosis and osteopenia.  His chemistries and labs are normal. He states that he feels a fullness in the back of his throat and I see erythema and mild radiation changes. His daughter inquired about RSV and shingles vaccines and I agreed that he should receive the RSV, and would hold off on the shingles vaccine. He denies signs of infection such as sore throat, sinus drainage, cough, or urinary symptoms.  He denies fevers or recurrent chills. He denies pain. He denies nausea, vomiting, chest pain, dyspnea or cough. His weight has decreased 6 pounds over last 4 months .  HISTORY:   Allergies: No Known Allergies  Current Medications: Current Outpatient Medications  Medication Sig Dispense Refill   alendronate (FOSAMAX) 10 MG tablet Take 10 mg by mouth every other day.     Cyanocobalamin 1000  MCG/ML KIT Inject 1,000 mcg as directed every 30 (thirty) days. Every 4 weeks     doxepin (SINEQUAN) 25 MG capsule Take 1 capsule by mouth at bedtime as needed (Sleeping).     nitroGLYCERIN (NITROSTAT) 0.4 MG SL tablet Place 1 tablet (0.4 mg total) under the tongue every 5 (five) minutes as needed for chest pain. 90 tablet 3   rosuvastatin (CRESTOR) 5 MG tablet Take 1 tablet by mouth every other day.      Vitamin D, Ergocalciferol, (DRISDOL) 1.25 MG (50000 UNIT) CAPS capsule Take 50,000 Units by mouth every 14 (fourteen) days.     No current facility-administered medications for this visit.    REVIEW OF SYSTEMS:  Review of Systems  Constitutional: Negative.  Negative for appetite change, chills, diaphoresis, fatigue, fever and unexpected weight change.  HENT:   Positive for hearing loss. Negative for lump/mass, mouth sores, nosebleeds, sore throat, tinnitus, trouble swallowing and voice change.   Eyes: Negative.  Negative for eye problems and icterus.  Respiratory: Negative.  Negative for chest tightness, cough, hemoptysis, shortness of breath and wheezing.   Cardiovascular: Negative.  Negative for chest pain,  leg swelling and palpitations.  Gastrointestinal: Negative.  Negative for abdominal distention, abdominal pain, blood in stool, constipation, diarrhea, nausea, rectal pain and vomiting.  Endocrine: Negative.  Negative for hot flashes.  Genitourinary: Negative.  Negative for bladder incontinence, difficulty urinating, dyspareunia, dysuria, frequency, hematuria, nocturia, pelvic pain and penile discharge.   Musculoskeletal: Negative.  Negative for arthralgias, back pain, flank pain, gait problem, myalgias, neck pain and neck stiffness.  Skin: Negative.  Negative for itching, rash and wound.  Neurological: Negative.  Negative for dizziness, extremity weakness, gait problem, headaches, light-headedness, numbness, seizures and speech difficulty.  Hematological: Negative.  Negative for  adenopathy. Does not bruise/bleed easily.  Psychiatric/Behavioral: Negative.  Negative for confusion, decreased concentration, depression, sleep disturbance and suicidal ideas. The patient is not nervous/anxious.   All other systems reviewed and are negative.    VITALS:  Blood pressure 128/69, pulse 70, temperature (!) 97.4 F (36.3 C), resp. rate 20, height '5\' 9"'$  (1.753 m), weight 166 lb 12.8 oz (75.7 kg), SpO2 98 %.  Wt Readings from Last 3 Encounters:  05/17/22 166 lb 12.8 oz (75.7 kg)  12/23/21 173 lb 4.8 oz (78.6 kg)  12/23/21 172 lb (78 kg)    Body mass index is 24.63 kg/m.  Performance status (ECOG): 0 - Asymptomatic  PHYSICAL EXAM:  Physical Exam Vitals and nursing note reviewed. Exam conducted with a chaperone present.  Constitutional:      General: He is not in acute distress.    Appearance: Normal appearance. He is normal weight. He is not ill-appearing, toxic-appearing or diaphoretic.  HENT:     Head: Normocephalic and atraumatic.     Right Ear: Tympanic membrane, ear canal and external ear normal. There is no impacted cerumen.     Left Ear: Tympanic membrane, ear canal and external ear normal. There is no impacted cerumen.     Ears:     Comments: Hard of hearing    Nose: Nose normal. No congestion or rhinorrhea.     Mouth/Throat:     Mouth: Mucous membranes are moist.     Pharynx: Posterior oropharyngeal erythema present. No oropharyngeal exudate.     Comments: Mild radiation changes. Eyes:     General: No scleral icterus.       Right eye: No discharge.        Left eye: No discharge.     Extraocular Movements: Extraocular movements intact.     Conjunctiva/sclera: Conjunctivae normal.     Pupils: Pupils are equal, round, and reactive to light.  Neck:     Vascular: No carotid bruit.  Cardiovascular:     Rate and Rhythm: Normal rate. Rhythm irregular.     Pulses: Normal pulses.     Heart sounds: Normal heart sounds. No murmur heard.    No friction rub. No  gallop.  Pulmonary:     Effort: Pulmonary effort is normal. No respiratory distress.     Breath sounds: Normal breath sounds. No stridor. No wheezing, rhonchi or rales.  Chest:     Chest wall: No tenderness.  Abdominal:     General: Bowel sounds are normal. There is no distension.     Palpations: Abdomen is soft. There is no mass.     Tenderness: There is no abdominal tenderness. There is no right CVA tenderness, left CVA tenderness, guarding or rebound.     Hernia: No hernia is present.  Musculoskeletal:        General: No swelling, tenderness, deformity or signs of injury. Normal  range of motion.     Cervical back: Normal range of motion and neck supple. No rigidity or tenderness.     Right lower leg: No edema.     Left lower leg: No edema.  Lymphadenopathy:     Cervical: No cervical adenopathy.  Skin:    General: Skin is warm and dry.     Coloration: Skin is not jaundiced or pale.     Findings: No bruising, erythema, lesion or rash.  Neurological:     General: No focal deficit present.     Mental Status: He is alert and oriented to person, place, and time. Mental status is at baseline.     Cranial Nerves: No cranial nerve deficit.     Sensory: No sensory deficit.     Motor: No weakness.     Coordination: Coordination normal.     Gait: Gait normal.     Deep Tendon Reflexes: Reflexes normal.  Psychiatric:        Mood and Affect: Mood normal.        Behavior: Behavior normal.        Thought Content: Thought content normal.        Judgment: Judgment normal.     LABS:      Latest Ref Rng & Units 05/13/2022   12:00 AM 12/23/2021   12:00 AM 07/22/2021   12:00 AM  CBC  WBC  8.3     7.9     7.6      Hemoglobin 13.5 - 17.5 13.1     12.5     13.0      Hematocrit 41 - 53 40     39     41      Platelets 150 - 400 K/uL 206     191     190         This result is from an external source.      Latest Ref Rng & Units 05/13/2022   12:00 AM 12/23/2021   12:00 AM 07/22/2021   12:00 AM   CMP  BUN 4 - '21 10     17     17      '$ Creatinine 0.6 - 1.3 0.8     1.0     1.0      Sodium 137 - 147 136     138     139      Potassium 3.5 - 5.1 mEq/L 4.5     4.3     4.3      Chloride 99 - 108 107     107     108      CO2 13 - '22 23     26     26      '$ Calcium 8.7 - 10.7 8.7     9.0     9.1      Alkaline Phos 25 - 125 69     70     66      AST 14 - 40 38     26     26      ALT 10 - 40 U/L '18     16     17         '$ This result is from an external source.    Lab Results  Component Value Date   TIBC 282 05/13/2022   TIBC 312 12/24/2021   TIBC 334 01/22/2021   FERRITIN 68.4 05/13/2022  FERRITIN 29 12/24/2021   FERRITIN 48 01/22/2021   IRONPCTSAT 39 05/13/2022   IRONPCTSAT 27 12/24/2021   IRONPCTSAT 34 01/22/2021   Lab Results  Component Value Date   LDH 102 12/23/2021   LDH 125 07/22/2021   LDH 119 04/23/2021    STUDIES:  Exam: 05/13/22 CT Neck with Contrast Impression: No appreciable residual/recurrent tongue base mass. No cervical lymphadenopathy. Submandibular gland atrophy. Aortic Atherosclerosis (ICD10-170.0). Atherosclerotic plaque is also present within the proximal major branch vessels of the neck and carotid arteries. Same day CT chest/abdomen/pelvis separately reported.   Exam: 05/13/2022 CT Chest, Abdomen, and Pelvis with Contrast impression No developing new mass lesion, fluid collection or lymph node enlargement. No splenomegaly. Acute appearing right anterolateral seventh rib fracture. Please correlate with any known history.  Large hiatal hernia. Extensive colonic diverticulosis. Non-obstructing left-sided renal stone. A routine call report will be initiated by radiology assistant team   EXAM: 07/15/21 NUCLEAR MEDICINE PET SKULL BASE TO THIGH  TECHNIQUE:  14.55 mCi F-18 FDG was injected intravenously. Full-ring PET imaging  was performed from the skull base to thigh after the radiotracer. CT  data was obtained and used for attenuation correction  and anatomic  localization.  Fasting blood glucose: 108 mg/dl  COMPARISON: PET-CT 07/07/2020  FINDINGS:  Mediastinal blood pool activity: SUV max 1.89  NECK: No hypermetabolic lymph nodes in the neck.  Incidental CT findings: none  CHEST: No hypermetabolic mediastinal or hilar nodes. No suspicious  pulmonary nodules on the CT scan.  Incidental CT findings: Port in the anterior chest wall with tip in  distal SVC. Large hiatal hernia.  ABDOMEN/PELVIS: No abnormal hypermetabolic activity within the  liver, pancreas, adrenal glands, or spleen. No hypermetabolic lymph  nodes in the abdomen or pelvis.  Incidental CT findings: Extensive LEFT colon diverticulosis. No  diverticulitis. Postcholecystectomy. Atherosclerotic calcification  of the aorta.  SKELETON: No focal hypermetabolic activity to suggest skeletal  metastasis.  Incidental CT findings: none  IMPRESSION:  No evidence of lymphoma recurrence.  Stable incidental findings as above.     I,Jasmine M Lassiter,acting as a scribe for [name], MD.,have documented all relevant documentation on the behalf of [name], MD,as directed by  [name], MD while in the presence of [name], MD.

## 2022-05-19 DIAGNOSIS — S51001D Unspecified open wound of right elbow, subsequent encounter: Secondary | ICD-10-CM | POA: Diagnosis not present

## 2022-05-19 DIAGNOSIS — S21109D Unspecified open wound of unspecified front wall of thorax without penetration into thoracic cavity, subsequent encounter: Secondary | ICD-10-CM | POA: Diagnosis not present

## 2022-05-19 DIAGNOSIS — S81801D Unspecified open wound, right lower leg, subsequent encounter: Secondary | ICD-10-CM | POA: Diagnosis not present

## 2022-05-19 DIAGNOSIS — S81009D Unspecified open wound, unspecified knee, subsequent encounter: Secondary | ICD-10-CM | POA: Diagnosis not present

## 2022-05-19 DIAGNOSIS — R1310 Dysphagia, unspecified: Secondary | ICD-10-CM | POA: Diagnosis not present

## 2022-05-19 DIAGNOSIS — T07XXXD Unspecified multiple injuries, subsequent encounter: Secondary | ICD-10-CM | POA: Diagnosis not present

## 2022-05-21 DIAGNOSIS — S81009D Unspecified open wound, unspecified knee, subsequent encounter: Secondary | ICD-10-CM | POA: Diagnosis not present

## 2022-05-21 DIAGNOSIS — S81801D Unspecified open wound, right lower leg, subsequent encounter: Secondary | ICD-10-CM | POA: Diagnosis not present

## 2022-05-21 DIAGNOSIS — S21109D Unspecified open wound of unspecified front wall of thorax without penetration into thoracic cavity, subsequent encounter: Secondary | ICD-10-CM | POA: Diagnosis not present

## 2022-05-21 DIAGNOSIS — T07XXXD Unspecified multiple injuries, subsequent encounter: Secondary | ICD-10-CM | POA: Diagnosis not present

## 2022-05-21 DIAGNOSIS — S51001D Unspecified open wound of right elbow, subsequent encounter: Secondary | ICD-10-CM | POA: Diagnosis not present

## 2022-05-21 DIAGNOSIS — R1310 Dysphagia, unspecified: Secondary | ICD-10-CM | POA: Diagnosis not present

## 2022-06-07 DIAGNOSIS — D519 Vitamin B12 deficiency anemia, unspecified: Secondary | ICD-10-CM | POA: Diagnosis not present

## 2022-06-08 ENCOUNTER — Encounter: Payer: Self-pay | Admitting: Oncology

## 2022-06-16 DIAGNOSIS — C4441 Basal cell carcinoma of skin of scalp and neck: Secondary | ICD-10-CM | POA: Diagnosis not present

## 2022-06-16 DIAGNOSIS — C44722 Squamous cell carcinoma of skin of right lower limb, including hip: Secondary | ICD-10-CM | POA: Diagnosis not present

## 2022-06-22 DIAGNOSIS — L02415 Cutaneous abscess of right lower limb: Secondary | ICD-10-CM | POA: Diagnosis not present

## 2022-06-23 ENCOUNTER — Ambulatory Visit: Payer: Medicare Other | Admitting: Oncology

## 2022-06-29 ENCOUNTER — Encounter: Payer: Self-pay | Admitting: Cardiology

## 2022-06-29 ENCOUNTER — Ambulatory Visit: Payer: Medicare Other | Attending: Cardiology | Admitting: Cardiology

## 2022-06-29 VITALS — BP 140/74 | HR 88 | Ht 69.0 in | Wt 168.0 lb

## 2022-06-29 DIAGNOSIS — I25118 Atherosclerotic heart disease of native coronary artery with other forms of angina pectoris: Secondary | ICD-10-CM | POA: Insufficient documentation

## 2022-06-29 DIAGNOSIS — L0291 Cutaneous abscess, unspecified: Secondary | ICD-10-CM | POA: Diagnosis not present

## 2022-06-29 DIAGNOSIS — C8331 Diffuse large B-cell lymphoma, lymph nodes of head, face, and neck: Secondary | ICD-10-CM | POA: Insufficient documentation

## 2022-06-29 DIAGNOSIS — E7800 Pure hypercholesterolemia, unspecified: Secondary | ICD-10-CM | POA: Insufficient documentation

## 2022-06-29 DIAGNOSIS — I420 Dilated cardiomyopathy: Secondary | ICD-10-CM | POA: Diagnosis not present

## 2022-06-29 DIAGNOSIS — C44722 Squamous cell carcinoma of skin of right lower limb, including hip: Secondary | ICD-10-CM | POA: Diagnosis not present

## 2022-06-29 MED ORDER — NITROGLYCERIN 0.4 MG SL SUBL: 0.4000 mg | SUBLINGUAL_TABLET | SUBLINGUAL | 3 refills | Status: DC | PRN

## 2022-06-29 NOTE — Progress Notes (Signed)
Cardiology Office Note:    Date:  06/29/2022   ID:  Dennison Labombard, DOB 06/23/1930, MRN [id number]  PCP:  Angelina Sheriff, MD  Cardiologist:  Jenne Campus, MD    Referring MD: Angelina Sheriff, MD   Chief Complaint  Patient presents with   Follow-up    History of Present Illness:    Khilan Gillison is a 89 y.o. male with past medical history significant for cardiomyopathy with ejection fraction 45% in summer 2021, however last echocardiogram done in September 2023 showed low normal ejection fraction, stress test done showed minimal area of ischemia, however since he does not have any chest pain tightness squeezing pressure burning chest he prefers conservative approach.  Additional problem include diffuse large B-cell lymphoma status post chemotherapy seems to be doing well from that point review, anxiety, essential hypertension, dyslipidemia. He comes today to my office for follow-up.  Overall he is doing very well.  He denies have any chest pain tightness squeezing pressure burning chest.  Sadly he did have some surgery for skin cancer in his leg and he cannot walk right now because it burns a lot when he tried walking he is very upset about this  Past Medical History:  Diagnosis Date   Anemia due to antineoplastic chemotherapy 04/13/2020   Atypical chest pain 10/10/2018   Cardiomegaly 06/08/2016   Diffuse large B-cell lymphoma ([place]) 10/30/2019   Dilated cardiomyopathy ([place]) ejection fraction 45% in summer 2021 by echo 05/20/2020   Encounter for venous access device care 04/09/2020   Esophageal reflux 10/10/2018   Hypercholesteremia 10/10/2018   Postoperative examination 12/12/2019   Senile nuclear sclerosis 05/19/2012   Senile nuclear sclerosis, bilateral 11/02/2019   Sleep disturbance 10/10/2018   SVT (supraventricular tachycardia) 09/20/2019    Past Surgical History:  Procedure Laterality Date   CHOLECYSTECTOMY      Current Medications: Current Meds   Medication Sig   alendronate (FOSAMAX) 10 MG tablet Take 10 mg by mouth every other day.   Cyanocobalamin 1000 MCG/ML KIT Inject 1,000 mcg as directed every 30 (thirty) days. Every 4 weeks   doxepin (SINEQUAN) 25 MG capsule Take 1 capsule by mouth at bedtime as needed (Sleeping).   rosuvastatin (CRESTOR) 5 MG tablet Take 1 tablet by mouth every other day.    Vitamin D, Ergocalciferol, (DRISDOL) 1.25 MG (50000 UNIT) CAPS capsule Take 50,000 Units by mouth every 14 (fourteen) days.   [DISCONTINUED] nitroGLYCERIN (NITROSTAT) 0.4 MG SL tablet Place 1 tablet (0.4 mg total) under the tongue every 5 (five) minutes as needed for chest pain.     Allergies:   Patient has no known allergies.   Social History   Socioeconomic History   Marital status: Widowed    Spouse name: Not on file   Number of children: Not on file   Years of education: Not on file   Highest education level: Not on file  Occupational History   Not on file  Tobacco Use   Smoking status: Never   Smokeless tobacco: Never  Substance and Sexual Activity   Alcohol use: Yes    Alcohol/week: 6.0 standard drinks of alcohol    Types: 6 Glasses of wine per week    Comment: 1-2 glasses of wine 2-3 times a week   Drug use: Never   Sexual activity: Not on file  Other Topics Concern   Not on file  Social History Narrative   Not on file   Social Determinants of Health  Financial Resource Strain: Not on file  Food Insecurity: Not on file  Transportation Needs: Not on file  Physical Activity: Not on file  Stress: Not on file  Social Connections: Not on file     Family History: The patient's family history includes CAD in his brother and father. ROS:   Please see the history of present illness.    All 14 point review of systems negative except as described per history of present illness  EKGs/Labs/Other Studies Reviewed:      Recent Labs: 05/13/2022: ALT 18; BUN 10; Creatinine 0.8; Hemoglobin 13.1; Platelets 206;  Potassium 4.5; Sodium 136  Recent Lipid Panel No results found for: "CHOL", "TRIG", "HDL", "CHOLHDL", "VLDL", "LDLCALC", "LDLDIRECT"  Physical Exam:    VS:  BP (!) 140/74 (BP Location: Left Arm, Patient Position: Sitting, Cuff Size: Normal)   Pulse 88   Ht 5' 9"$  (1.753 m)   Wt 168 lb (76.2 kg)   SpO2 97%   BMI 24.81 kg/m     Wt Readings from Last 3 Encounters:  06/29/22 168 lb (76.2 kg)  05/17/22 166 lb 12.8 oz (75.7 kg)  12/23/21 173 lb 4.8 oz (78.6 kg)     GEN:  Well nourished, well developed in no acute distress HEENT: Normal NECK: No JVD; No carotid bruits LYMPHATICS: No lymphadenopathy CARDIAC: RRR, no murmurs, no rubs, no gallops RESPIRATORY:  Clear to auscultation without rales, wheezing or rhonchi  ABDOMEN: Soft, non-tender, non-distended MUSCULOSKELETAL:  No edema; No deformity  SKIN: Warm and dry LOWER EXTREMITIES: no swelling NEUROLOGIC:  Alert and oriented x 3 PSYCHIATRIC:  Normal affect   ASSESSMENT:    1. Coronary artery disease of native artery of native heart with stable angina pectoris ([facility])   2. Dilated cardiomyopathy ([facility]) ejection fraction 45% in summer 2021 by echo   3. Diffuse large B-cell lymphoma of lymph nodes of neck ([facility])   4. Hypercholesteremia    PLAN:    In order of problems listed above:  Coronary artery disease stable from that point review did not want to have any intervention done upon is that he is asymptomatic.  I will put him back on aspirin. Dilated cardiomyopathy but ejection fraction lower limits of normal he does not want to add any medications.  He tells me he has been doing well for so many years he does not want to add anything right now. Diffuse large B-cell lymphoma follow-up like always excellently by Dr. Anabel Bene.  He is apparently in complete remission. Dyslipidemia I do have K PN showing me data from summer of last year with total cholesterol 145 HDL 60 close continue present management   Medication Adjustments/Labs  and Tests Ordered: Current medicines are reviewed at length with the patient today.  Concerns regarding medicines are outlined above.  Orders Placed This Encounter  Procedures   EKG 12-Lead   Medication changes:  Meds ordered this encounter  Medications   nitroGLYCERIN (NITROSTAT) 0.4 MG SL tablet    Sig: Place 1 tablet (0.4 mg total) under the tongue every 5 (five) minutes as needed for chest pain.    Dispense:  25 tablet    Refill:  3    Signed, Park Liter, MD, [facility] 06/29/2022 10:38 AM    [facility]

## 2022-06-29 NOTE — Patient Instructions (Signed)
Medication Instructions:  Your physician recommends that you continue on your current medications as directed. Please refer to the Current Medication list given to you today.  *If you need a refill on your cardiac medications before your next appointment, please call your pharmacy*   Lab Work: NONE If you have labs (blood work) drawn today and your tests are completely normal, you will receive your results only by: [place] (if you have MyChart) OR A paper copy in the mail If you have any lab test that is abnormal or we need to change your treatment, we will call you to review the results.   Testing/Procedures: NONE   Follow-Up: At [facility], you and your health needs are our priority.  As part of our continuing mission to provide you with exceptional heart care, we have created designated Provider Care Teams.  These Care Teams include your primary Cardiologist (physician) and Advanced Practice Providers (APPs -  Physician Assistants and Nurse Practitioners) who all work together to provide you with the care you need, when you need it.  We recommend signing up for the patient portal called "MyChart".  Sign up information is provided on this After Visit Summary.  MyChart is used to connect with patients for Virtual Visits (Telemedicine).  Patients are able to view lab/test results, encounter notes, upcoming appointments, etc.  Non-urgent messages can be sent to your provider as well.   To learn more about what you can do with MyChart, go to [email or web address].    Your next appointment:   6 month(s)  Provider:   Jenne Campus, MD    Other Instructions

## 2022-07-13 DIAGNOSIS — L0100 Impetigo, unspecified: Secondary | ICD-10-CM | POA: Diagnosis not present

## 2022-07-13 DIAGNOSIS — L97911 Non-pressure chronic ulcer of unspecified part of right lower leg limited to breakdown of skin: Secondary | ICD-10-CM | POA: Diagnosis not present

## 2022-07-22 DIAGNOSIS — D519 Vitamin B12 deficiency anemia, unspecified: Secondary | ICD-10-CM | POA: Diagnosis not present

## 2022-07-28 DIAGNOSIS — C44722 Squamous cell carcinoma of skin of right lower limb, including hip: Secondary | ICD-10-CM | POA: Diagnosis not present

## 2022-08-04 DIAGNOSIS — L97911 Non-pressure chronic ulcer of unspecified part of right lower leg limited to breakdown of skin: Secondary | ICD-10-CM | POA: Diagnosis not present

## 2022-08-11 DIAGNOSIS — L97911 Non-pressure chronic ulcer of unspecified part of right lower leg limited to breakdown of skin: Secondary | ICD-10-CM | POA: Diagnosis not present

## 2022-08-13 ENCOUNTER — Inpatient Hospital Stay: Payer: Medicare Other | Attending: Oncology

## 2022-08-13 VITALS — BP 142/72 | HR 90 | Temp 97.4°F | Resp 18 | Ht 69.0 in | Wt 169.0 lb

## 2022-08-13 DIAGNOSIS — Z452 Encounter for adjustment and management of vascular access device: Secondary | ICD-10-CM | POA: Diagnosis not present

## 2022-08-13 DIAGNOSIS — C8331 Diffuse large B-cell lymphoma, lymph nodes of head, face, and neck: Secondary | ICD-10-CM | POA: Diagnosis not present

## 2022-08-13 MED ORDER — SODIUM CHLORIDE 0.9% FLUSH
10.0000 mL | INTRAVENOUS | Status: DC | PRN
  Administered 2022-08-13: 10 mL

## 2022-08-13 MED ORDER — HEPARIN SOD (PORK) LOCK FLUSH 100 UNIT/ML IV SOLN
500.0000 [IU] | Freq: Once | INTRAVENOUS | Status: AC | PRN
  Administered 2022-08-13: 500 [IU]

## 2022-08-13 NOTE — Patient Instructions (Signed)

## 2022-08-19 DIAGNOSIS — L97911 Non-pressure chronic ulcer of unspecified part of right lower leg limited to breakdown of skin: Secondary | ICD-10-CM | POA: Diagnosis not present

## 2022-09-01 DIAGNOSIS — D519 Vitamin B12 deficiency anemia, unspecified: Secondary | ICD-10-CM | POA: Diagnosis not present

## 2022-09-14 DIAGNOSIS — R269 Unspecified abnormalities of gait and mobility: Secondary | ICD-10-CM | POA: Diagnosis not present

## 2022-09-14 DIAGNOSIS — E7800 Pure hypercholesterolemia, unspecified: Secondary | ICD-10-CM | POA: Diagnosis not present

## 2022-09-14 DIAGNOSIS — I7090 Unspecified atherosclerosis: Secondary | ICD-10-CM | POA: Diagnosis not present

## 2022-09-14 DIAGNOSIS — E559 Vitamin D deficiency, unspecified: Secondary | ICD-10-CM | POA: Diagnosis not present

## 2022-09-14 DIAGNOSIS — S81801A Unspecified open wound, right lower leg, initial encounter: Secondary | ICD-10-CM | POA: Diagnosis not present

## 2022-09-14 DIAGNOSIS — M8589 Other specified disorders of bone density and structure, multiple sites: Secondary | ICD-10-CM | POA: Diagnosis not present

## 2022-09-14 DIAGNOSIS — D510 Vitamin B12 deficiency anemia due to intrinsic factor deficiency: Secondary | ICD-10-CM | POA: Diagnosis not present

## 2022-09-16 DIAGNOSIS — L97911 Non-pressure chronic ulcer of unspecified part of right lower leg limited to breakdown of skin: Secondary | ICD-10-CM | POA: Diagnosis not present

## 2022-09-16 DIAGNOSIS — L570 Actinic keratosis: Secondary | ICD-10-CM | POA: Diagnosis not present

## 2022-09-20 DIAGNOSIS — R2689 Other abnormalities of gait and mobility: Secondary | ICD-10-CM | POA: Diagnosis not present

## 2022-09-23 DIAGNOSIS — R2689 Other abnormalities of gait and mobility: Secondary | ICD-10-CM | POA: Diagnosis not present

## 2022-09-27 DIAGNOSIS — R2689 Other abnormalities of gait and mobility: Secondary | ICD-10-CM | POA: Diagnosis not present

## 2022-09-30 DIAGNOSIS — R2689 Other abnormalities of gait and mobility: Secondary | ICD-10-CM | POA: Diagnosis not present

## 2022-10-06 DIAGNOSIS — R2689 Other abnormalities of gait and mobility: Secondary | ICD-10-CM | POA: Diagnosis not present

## 2022-10-11 DIAGNOSIS — R2689 Other abnormalities of gait and mobility: Secondary | ICD-10-CM | POA: Diagnosis not present

## 2022-10-13 DIAGNOSIS — R2689 Other abnormalities of gait and mobility: Secondary | ICD-10-CM | POA: Diagnosis not present

## 2022-10-18 DIAGNOSIS — R2689 Other abnormalities of gait and mobility: Secondary | ICD-10-CM | POA: Diagnosis not present

## 2022-10-19 DIAGNOSIS — D519 Vitamin B12 deficiency anemia, unspecified: Secondary | ICD-10-CM | POA: Diagnosis not present

## 2022-10-21 DIAGNOSIS — R2689 Other abnormalities of gait and mobility: Secondary | ICD-10-CM | POA: Diagnosis not present

## 2022-10-25 DIAGNOSIS — R2689 Other abnormalities of gait and mobility: Secondary | ICD-10-CM | POA: Diagnosis not present

## 2022-10-28 DIAGNOSIS — L97911 Non-pressure chronic ulcer of unspecified part of right lower leg limited to breakdown of skin: Secondary | ICD-10-CM | POA: Diagnosis not present

## 2022-11-01 DIAGNOSIS — R2689 Other abnormalities of gait and mobility: Secondary | ICD-10-CM | POA: Diagnosis not present

## 2022-11-04 DIAGNOSIS — R2689 Other abnormalities of gait and mobility: Secondary | ICD-10-CM | POA: Diagnosis not present

## 2022-11-08 ENCOUNTER — Encounter: Payer: Self-pay | Admitting: Oncology

## 2022-11-15 ENCOUNTER — Inpatient Hospital Stay: Payer: Medicare Other | Attending: Oncology | Admitting: Hematology and Oncology

## 2022-11-15 ENCOUNTER — Inpatient Hospital Stay: Payer: Medicare Other

## 2022-11-15 VITALS — BP 166/72 | HR 80 | Temp 97.7°F | Resp 18 | Ht 69.0 in | Wt 165.5 lb

## 2022-11-15 DIAGNOSIS — Z08 Encounter for follow-up examination after completed treatment for malignant neoplasm: Secondary | ICD-10-CM | POA: Insufficient documentation

## 2022-11-15 DIAGNOSIS — Z452 Encounter for adjustment and management of vascular access device: Secondary | ICD-10-CM | POA: Insufficient documentation

## 2022-11-15 DIAGNOSIS — Z8572 Personal history of non-Hodgkin lymphomas: Secondary | ICD-10-CM | POA: Insufficient documentation

## 2022-11-15 DIAGNOSIS — D649 Anemia, unspecified: Secondary | ICD-10-CM | POA: Insufficient documentation

## 2022-11-15 DIAGNOSIS — C8331 Diffuse large B-cell lymphoma, lymph nodes of head, face, and neck: Secondary | ICD-10-CM | POA: Diagnosis not present

## 2022-11-15 LAB — LACTATE DEHYDROGENASE: LDH: 109 U/L (ref 98–192)

## 2022-11-15 LAB — CMP (CANCER CENTER ONLY)
ALT: 14 U/L (ref 0–44)
AST: 18 U/L (ref 15–41)
Albumin: 3.6 g/dL (ref 3.5–5.0)
Alkaline Phosphatase: 58 U/L (ref 38–126)
Anion gap: 6 (ref 5–15)
BUN: 19 mg/dL (ref 8–23)
CO2: 25 mmol/L (ref 22–32)
Calcium: 8.9 mg/dL (ref 8.9–10.3)
Chloride: 105 mmol/L (ref 98–111)
Creatinine: 1.03 mg/dL (ref 0.61–1.24)
GFR, Estimated: >60 mL/min (ref >60)
Glucose, Bld: 133 mg/dL — ABNORMAL HIGH (ref 70–99)
Potassium: 4.1 mmol/L (ref 3.5–5.1)
Sodium: 136 mmol/L (ref 135–145)
Total Bilirubin: 0.4 mg/dL (ref 0.3–1.2)
Total Protein: 6.4 g/dL — ABNORMAL LOW (ref 6.5–8.1)

## 2022-11-15 LAB — CBC WITH DIFFERENTIAL (CANCER CENTER ONLY)
Abs Immature Granulocytes: 0.01 10*3/uL (ref 0.00–0.07)
Basophils Absolute: 0 10*3/uL (ref 0.0–0.1)
Basophils Relative: 0 %
Eosinophils Absolute: 0.2 10*3/uL (ref 0.0–0.5)
Eosinophils Relative: 2 %
HCT: 36.4 % — ABNORMAL LOW (ref 39.0–52.0)
Hemoglobin: 11.7 g/dL — ABNORMAL LOW (ref 13.0–17.0)
Immature Granulocytes: 0 %
Lymphocytes Relative: 16 %
Lymphs Abs: 1.2 10*3/uL (ref 0.7–4.0)
MCH: 28.7 pg (ref 26.0–34.0)
MCHC: 32.1 g/dL (ref 30.0–36.0)
MCV: 89.2 fL (ref 80.0–100.0)
Monocytes Absolute: 0.8 10*3/uL (ref 0.1–1.0)
Monocytes Relative: 11 %
Neutro Abs: 5.3 10*3/uL (ref 1.7–7.7)
Neutrophils Relative %: 71 %
Platelet Count: 191 10*3/uL (ref 150–400)
RBC: 4.08 MIL/uL — ABNORMAL LOW (ref 4.22–5.81)
RDW: 14.4 % (ref 11.5–15.5)
WBC Count: 7.5 10*3/uL (ref 4.0–10.5)
nRBC: 0 % (ref 0.0–0.2)

## 2022-11-15 MED ORDER — HEPARIN SOD (PORK) LOCK FLUSH 100 UNIT/ML IV SOLN
500.0000 [IU] | Freq: Once | INTRAVENOUS | Status: AC | PRN
  Administered 2022-11-15: 500 [IU]

## 2022-11-15 MED ORDER — SODIUM CHLORIDE 0.9% FLUSH
10.0000 mL | INTRAVENOUS | Status: DC | PRN
  Administered 2022-11-15: 10 mL

## 2022-11-15 NOTE — Progress Notes (Unsigned)
Patient does have a dressing on right lower back of leg.  He states it is skin cancer.

## 2022-11-15 NOTE — Progress Notes (Signed)
Ryan Ball Ryan Ball  [address] [place],  [place]  [address] [phone]  Clinic Day:  11/15/2022  Referring physician: Noni Saupe, MD  ASSESSMENT & PLAN:   Assessment & Plan: Diffuse large B-cell lymphoma (HCC) Stage II diffuse large B-cell lymphoma noticed in June 2021.  He was treated with 3 cycles of R-mini-CHOP without significant difficulty.  PET imaging revealed remission with no evidence of metabolically active lymphoma.  The mass at the tongue base had resolved.  He then received consolidation radiation completed in November 2021.  CT neck, chest, abdomen and pelvis in January did not reveal any evidence of recurrence.  He remains without clinical evidence of recurrence.  We will plan to see him back in 6 months with a CBC, CMP, LDH, and CT neck.  Anemia Worsening anemia.  He has had mild anemia since chemotherapy.  I will evaluate with nutritional studies, as well as check for hemolysis.    The patient understands the plans discussed today and is in agreement with them.  He knows to contact our office if he develops concerns prior to his next appointment.   I provided 15 minutes of face-to-face time during this encounter and > 50% was spent counseling as documented under my assessment and plan.    Ryan Perl, PA-C  [name] AT [name] [address] [place] [address] Dept: [phone] Dept Fax: [phone]   Orders Placed This Encounter  Procedures   CT Soft Tissue Neck W Contrast    Standing Status:   Future    Standing Expiration Date:   11/15/2023    Scheduling Instructions:     RH    Order Specific Question:   If indicated for the ordered procedure, I authorize the administration of contrast media per Radiology protocol    Answer:   Yes    Order Specific Question:   Does the patient have a contrast media/X-ray dye allergy?    Answer:   No    Order Specific  Question:   Preferred imaging location?    Answer:   External   CBC with Differential (Cancer Center Only)    Standing Status:   Future    Standing Expiration Date:   11/15/2023   CMP (Cancer Center only)    Standing Status:   Future    Standing Expiration Date:   11/15/2023   Lactate dehydrogenase    Standing Status:   Future    Standing Expiration Date:   11/15/2023   Ferritin    Standing Status:   Future    Number of Occurrences:   1    Standing Expiration Date:   11/16/2023   Iron and TIBC    Standing Status:   Future    Number of Occurrences:   1    Standing Expiration Date:   11/16/2023   Vitamin B12    Standing Status:   Future    Number of Occurrences:   1    Standing Expiration Date:   11/16/2023      CHIEF COMPLAINT:  CC: Diffuse large B-cell lymphoma  Current Treatment: Surveillance  HISTORY OF PRESENT ILLNESS:  Ryan Ball is a 89 y.o. male with stage IIA diffuse large B-cell lymphoma diagnosed in June 2021.  He presented with voice changes and dysphagia.  CT imaging revealed 2.4 x 3.9 x 4.8 cm posterior tongue base mass, as well as mild bilateral cervical lymphadenopathy.  He saw Dr. Lendell Ball and underwent biopsy, which revealed diffuse large B-cell lymphoma, non-germinal center phenotype, which was CD45, CD19, CD20 and CD38 positive.  PET-CT revealed hypermetabolic tongue base mass measuring 4.8 x 2.1 cm with an SUV of 15.5 with bilateral cervical lymph nodes measuring 1.5 cm with an SUV max of 11.3.  He was seen by Dr. Alphonsa Ball at [facility].  She recommended starting with R-mini-CHOP given every 3 weeks, increasing the doses of chemotherapy with each cycle as tolerated.  Prior to chemotherapy he had an echocardiogram, which revealed mild concentric left ventricular hypertrophy, with mild global hypokinesis of the left ventricle and overall mildly impaired left ventricular systolic function with an ejection fraction of 45-50%.  He had his 1st cycle of  R-mini-CHOP, with the chemotherapy given at 50% dose reduction, on July 12th. He received his second cycle at 75% dose and a third cycle at 90%. PET imaging from September 2021 revealed no evidence of metabolically active lymphoma within the neck, chest, abdomen or pelvis.  The previously demonstrated mass at the base of the tongue has resolved.  He was referred for involved field radiation for consolidation.  PET in February 2022 revealed a stable exam with no evidence for metabolically active lymphoma in the neck, chest, abdomen, or pelvis.  PET in September 2022 revealed a single weakly hypermetabolic left level 1B lymph node, to be most likely inflammatory but attention on future studies is suggested.  PET in March 2023 did not reveal any hypermetabolic activity or other evidence of recurrent lymphoma.  CT neck, chest, abdomen and pelvis in January did not reveal any evidence of recurrence.  Imaging in 1 year was recommended by Dr. Gilman Ball.  He had skin cancer removed from the right lower extremity at Dr. Mayford Ball office.    Oncology History  Diffuse large B-cell lymphoma (HCC)  10/19/2019 Cancer Staging   Staging form: Hodgkin and Non-Hodgkin Lymphoma, AJCC 8th Edition - Clinical stage from 10/19/2019: Stage II (Diffuse large B-cell lymphoma) - Signed by Ryan Beckwith, MD on 04/13/2020 Prognostic indicators: 4.8 cm mass of posterior tongue base, CR on PET after 3 cycles R-CHOP   10/30/2019 Initial Diagnosis   Diffuse large B-cell lymphoma (HCC)       INTERVAL HISTORY:  Ryan Ball is here today for repeat clinical assessment.  He denies fatigue. He denies fevers, chills or night sweats. He denies pain. His appetite is good. His weight has decreased 4 pounds over last 6 months .  He states the wound of the right lower extremity has not completely healed.  He is hard of hearing, but has hearing aids in today.  REVIEW OF SYSTEMS:  Review of Systems  Constitutional:  Negative for appetite  change, chills, fatigue, fever and unexpected weight change.  HENT:   Negative for lump/mass, mouth sores and sore throat.   Respiratory:  Negative for cough and shortness of breath.   Cardiovascular:  Negative for chest pain and leg swelling.  Gastrointestinal:  Negative for abdominal pain, constipation, diarrhea, nausea and vomiting.  Genitourinary:  Negative for difficulty urinating, dysuria, frequency and hematuria.   Musculoskeletal:  Negative for arthralgias, back pain and myalgias.  Skin:  Negative for itching, rash and wound.  Neurological:  Negative for dizziness, extremity weakness, headaches, light-headedness and numbness.  Hematological:  Negative for adenopathy.  Psychiatric/Behavioral:  Negative for depression and sleep disturbance. The patient is not nervous/anxious.      VITALS:  Blood pressure (!) 166/72, pulse 80, temperature  97.7 F (36.5 C), temperature source Oral, resp. rate 18, height 5\' 9"  (1.753 m), weight 165 lb 8 oz (75.1 kg), SpO2 95 %.  Wt Readings from Last 3 Encounters:  11/15/22 165 lb 8 oz (75.1 kg)  08/13/22 169 lb 0.6 oz (76.7 kg)  06/29/22 168 lb (76.2 kg)    Body mass index is 24.44 kg/m.  Performance status (ECOG): 0 - Asymptomatic  PHYSICAL EXAM:  Physical Exam Vitals and nursing note reviewed.  Constitutional:      General: He is not in acute distress.    Appearance: Normal appearance. He is normal weight.  HENT:     Head: Normocephalic and atraumatic.     Mouth/Throat:     Mouth: Mucous membranes are moist. No oral lesions.     Tongue: No lesions.     Palate: No mass.     Pharynx: Oropharynx is clear. No oropharyngeal exudate or posterior oropharyngeal erythema.  Eyes:     General: No scleral icterus.    Extraocular Movements: Extraocular movements intact.     Conjunctiva/sclera: Conjunctivae normal.     Pupils: Pupils are equal, round, and reactive to light.  Cardiovascular:     Rate and Rhythm: Normal rate and regular rhythm.      Heart sounds: Normal heart sounds. No murmur heard.    No friction rub. No gallop.  Pulmonary:     Effort: Pulmonary effort is normal.     Breath sounds: Normal breath sounds. No wheezing, rhonchi or rales.  Abdominal:     General: Bowel sounds are normal. There is no distension.     Palpations: Abdomen is soft. There is no hepatomegaly, splenomegaly or mass.     Tenderness: There is no abdominal tenderness.  Musculoskeletal:        General: Normal range of motion.     Cervical back: Normal range of motion and neck supple. No tenderness.     Right lower leg: No edema.     Left lower leg: No edema.  Lymphadenopathy:     Cervical: No cervical adenopathy.     Upper Body:     Right upper body: No supraclavicular or axillary adenopathy.     Left upper body: No supraclavicular or axillary adenopathy.     Lower Body: No right inguinal adenopathy. No left inguinal adenopathy.  Skin:    General: Skin is warm and dry.     Coloration: Skin is not jaundiced.     Findings: Wound (About 2 cm wound with overlying eschar of the right distal lower extremity posteriorly) present. No rash.  Neurological:     Mental Status: He is alert and oriented to person, place, and time.     Cranial Nerves: No cranial nerve deficit.  Psychiatric:        Mood and Affect: Mood normal.        Behavior: Behavior normal.        Thought Content: Thought content normal.     LABS:      Latest Ref Rng & Units 11/15/2022    2:29 PM 05/13/2022   12:00 AM 12/23/2021   12:00 AM  CBC  WBC 4.0 - 10.5 K/uL 7.5  8.3     7.9      Hemoglobin 13.0 - 17.0 g/dL 13.2  44.0     10.2      Hematocrit 39.0 - 52.0 % 36.4  40     39      Platelets 150 - 400 K/uL  191  206     191         This result is from an external source.      Latest Ref Rng & Units 11/15/2022    2:29 PM 05/13/2022   12:00 AM 12/23/2021   12:00 AM  CMP  Glucose 70 - 99 mg/dL 161     BUN 8 - 23 mg/dL 19  10     17       Creatinine 0.61 - 1.24 mg/dL 0.96  0.8      1.0      Sodium 135 - 145 mmol/L 136  136     138      Potassium 3.5 - 5.1 mmol/L 4.1  4.5     4.3      Chloride 98 - 111 mmol/L 105  107     107      CO2 22 - 32 mmol/L 25  23     26       Calcium 8.9 - 10.3 mg/dL 8.9  8.7     9.0      Total Protein 6.5 - 8.1 g/dL 6.4     Total Bilirubin 0.3 - 1.2 mg/dL 0.4     Alkaline Phos 38 - 126 U/L 58  69     70      AST 15 - 41 U/L 18  38     26      ALT 0 - 44 U/L 14  18     16          This result is from an external source.     No results found for: "CEA1", "CEA" / No results found for: "CEA1", "CEA" No results found for: "PSA1" No results found for: "EAV409" No results found for: "CAN125"  No results found for: "TOTALPROTELP", "ALBUMINELP", "A1GS", "A2GS", "BETS", "BETA2SER", "GAMS", "MSPIKE", "SPEI" Lab Results  Component Value Date   TIBC 282 05/13/2022   TIBC 312 12/24/2021   TIBC 334 01/22/2021   FERRITIN 68.4 05/13/2022   FERRITIN 29 12/24/2021   FERRITIN 48 01/22/2021   IRONPCTSAT 39 05/13/2022   IRONPCTSAT 27 12/24/2021   IRONPCTSAT 34 01/22/2021   Lab Results  Component Value Date   LDH 109 11/15/2022   LDH 102 12/23/2021   LDH 125 07/22/2021    STUDIES:  No results found.    HISTORY:   Past Medical History:  Diagnosis Date   Anemia 11/16/2022   Anemia due to antineoplastic chemotherapy 04/13/2020   Atypical chest pain 10/10/2018   Cardiomegaly 06/08/2016   Diffuse large B-cell lymphoma (HCC) 10/30/2019   Dilated cardiomyopathy (HCC) ejection fraction 45% in summer 2021 by echo 05/20/2020   Encounter for venous access device care 04/09/2020   Esophageal reflux 10/10/2018   Hypercholesteremia 10/10/2018   Postoperative examination 12/12/2019   Senile nuclear sclerosis 05/19/2012   Senile nuclear sclerosis, bilateral 11/02/2019   Sleep disturbance 10/10/2018   SVT (supraventricular tachycardia) 09/20/2019    Past Surgical History:  Procedure Laterality Date   CHOLECYSTECTOMY      Family History   Problem Relation Age of Onset   CAD Father    CAD Brother     Social History:  reports that he has never smoked. He has never used smokeless tobacco. He reports current alcohol use of about 6.0 standard drinks of alcohol per week. He reports that he does not use drugs.The patient is alone today.  Allergies: No Known Allergies  Current Medications: Current Outpatient Medications  Medication Sig Dispense Refill   esomeprazole (NEXIUM) 40 MG capsule Take by mouth.     Methylcobalamin 40981 MCG SOLR Inject as directed See admin instructions.     alendronate (FOSAMAX) 10 MG tablet Take 10 mg by mouth every other day.     doxepin (SINEQUAN) 25 MG capsule Take 1 capsule by mouth at bedtime as needed (Sleeping).     nitroGLYCERIN (NITROSTAT) 0.4 MG SL tablet Place 1 tablet (0.4 mg total) under the tongue every 5 (five) minutes as needed for chest pain. 25 tablet 3   REPLESTA 1.25 MG (50000 UT) WAFR SMARTSIG:1 Wafer By Mouth Every 2 Weeks     rosuvastatin (CRESTOR) 5 MG tablet Take 1 tablet by mouth every other day.      Current Facility-Administered Medications  Medication Dose Route Frequency Provider Last Rate Last Admin   sodium chloride flush (NS) 0.9 % injection 10 mL  10 mL Intracatheter PRN Ilda Basset A, NP   10 mL at 11/15/22 1548

## 2022-11-15 NOTE — Assessment & Plan Note (Signed)
Stage II diffuse large B-cell lymphoma noticed in June 2021.  He was treated with 3 cycles of R-mini-CHOP without significant difficulty.  PET imaging revealed remission with no evidence of metabolically active lymphoma.  The mass at the tongue base had resolved.  He then received consolidation radiation completed in November 2021.  CT neck, chest, abdomen and pelvis in January did not reveal any evidence of recurrence.  He remains without clinical evidence of recurrence.  We will plan to see him back in 6 months with a CBC, CMP, LDH, and CT neck.

## 2022-11-16 ENCOUNTER — Encounter: Payer: Self-pay | Admitting: Hematology and Oncology

## 2022-11-16 ENCOUNTER — Encounter: Payer: Self-pay | Admitting: Oncology

## 2022-11-16 ENCOUNTER — Other Ambulatory Visit: Payer: Self-pay

## 2022-11-16 DIAGNOSIS — D649 Anemia, unspecified: Secondary | ICD-10-CM | POA: Diagnosis not present

## 2022-11-16 DIAGNOSIS — Z08 Encounter for follow-up examination after completed treatment for malignant neoplasm: Secondary | ICD-10-CM | POA: Diagnosis not present

## 2022-11-16 DIAGNOSIS — Z452 Encounter for adjustment and management of vascular access device: Secondary | ICD-10-CM | POA: Diagnosis not present

## 2022-11-16 DIAGNOSIS — Z8572 Personal history of non-Hodgkin lymphomas: Secondary | ICD-10-CM | POA: Diagnosis not present

## 2022-11-16 HISTORY — DX: Anemia, unspecified: D64.9

## 2022-11-16 LAB — IRON AND TIBC
Iron: 93 ug/dL (ref 45–182)
Saturation Ratios: 30 % (ref 17.9–39.5)
TIBC: 309 ug/dL (ref 250–450)
UIBC: 216 ug/dL

## 2022-11-16 LAB — VITAMIN B12: Vitamin B-12: 292 pg/mL (ref 180–914)

## 2022-11-16 LAB — FERRITIN: Ferritin: 34 ng/mL (ref 24–336)

## 2022-11-16 LAB — FOLATE: Folate: 12.2 ng/mL (ref >5.9)

## 2022-11-16 NOTE — Assessment & Plan Note (Signed)
Worsening anemia.  He has had mild anemia since chemotherapy.  I will evaluate with nutritional studies, as well as check for hemolysis.

## 2022-11-17 DIAGNOSIS — D519 Vitamin B12 deficiency anemia, unspecified: Secondary | ICD-10-CM | POA: Diagnosis not present

## 2022-11-18 ENCOUNTER — Other Ambulatory Visit: Payer: Self-pay | Admitting: Hematology and Oncology

## 2022-11-18 DIAGNOSIS — D649 Anemia, unspecified: Secondary | ICD-10-CM

## 2022-11-18 LAB — HAPTOGLOBIN: Haptoglobin: 146 mg/dL (ref 38–329)

## 2022-12-09 ENCOUNTER — Other Ambulatory Visit: Payer: Self-pay | Admitting: Oncology

## 2022-12-09 ENCOUNTER — Inpatient Hospital Stay: Payer: Medicare Other | Attending: Oncology

## 2022-12-09 DIAGNOSIS — Z08 Encounter for follow-up examination after completed treatment for malignant neoplasm: Secondary | ICD-10-CM | POA: Diagnosis not present

## 2022-12-09 DIAGNOSIS — K449 Diaphragmatic hernia without obstruction or gangrene: Secondary | ICD-10-CM | POA: Insufficient documentation

## 2022-12-09 DIAGNOSIS — Z8572 Personal history of non-Hodgkin lymphomas: Secondary | ICD-10-CM | POA: Insufficient documentation

## 2022-12-09 DIAGNOSIS — D649 Anemia, unspecified: Secondary | ICD-10-CM | POA: Diagnosis not present

## 2022-12-09 DIAGNOSIS — D6481 Anemia due to antineoplastic chemotherapy: Secondary | ICD-10-CM

## 2022-12-09 LAB — CMP (CANCER CENTER ONLY)
ALT: 18 U/L (ref 0–44)
AST: 22 U/L (ref 15–41)
Albumin: 3.6 g/dL (ref 3.5–5.0)
Alkaline Phosphatase: 53 U/L (ref 38–126)
Anion gap: 9 (ref 5–15)
BUN: 14 mg/dL (ref 8–23)
CO2: 27 mmol/L (ref 22–32)
Calcium: 8.8 mg/dL — ABNORMAL LOW (ref 8.9–10.3)
Chloride: 104 mmol/L (ref 98–111)
Creatinine: 1.1 mg/dL (ref 0.61–1.24)
GFR, Estimated: >60 mL/min (ref >60)
Glucose, Bld: 125 mg/dL — ABNORMAL HIGH (ref 70–99)
Potassium: 3.8 mmol/L (ref 3.5–5.1)
Sodium: 140 mmol/L (ref 135–145)
Total Bilirubin: 0.7 mg/dL (ref 0.3–1.2)
Total Protein: 6.3 g/dL — ABNORMAL LOW (ref 6.5–8.1)

## 2022-12-09 LAB — RETICULOCYTES
Immature Retic Fract: 6.5 % (ref 2.3–15.9)
RBC.: 4.26 MIL/uL (ref 4.22–5.81)
Retic Count, Absolute: 39.2 10*3/uL (ref 19.0–186.0)
Retic Ct Pct: 0.9 % (ref 0.4–3.1)

## 2022-12-09 LAB — CBC WITH DIFFERENTIAL (CANCER CENTER ONLY)
Abs Immature Granulocytes: 0.02 10*3/uL (ref 0.00–0.07)
Basophils Absolute: 0 10*3/uL (ref 0.0–0.1)
Basophils Relative: 0 %
Eosinophils Absolute: 0.2 10*3/uL (ref 0.0–0.5)
Eosinophils Relative: 2 %
HCT: 39.2 % (ref 39.0–52.0)
Hemoglobin: 12.3 g/dL — ABNORMAL LOW (ref 13.0–17.0)
Immature Granulocytes: 0 %
Lymphocytes Relative: 15 %
Lymphs Abs: 1.3 10*3/uL (ref 0.7–4.0)
MCH: 28.5 pg (ref 26.0–34.0)
MCHC: 31.4 g/dL (ref 30.0–36.0)
MCV: 91 fL (ref 80.0–100.0)
Monocytes Absolute: 0.8 10*3/uL (ref 0.1–1.0)
Monocytes Relative: 9 %
Neutro Abs: 6.3 10*3/uL (ref 1.7–7.7)
Neutrophils Relative %: 74 %
Platelet Count: 199 10*3/uL (ref 150–400)
RBC: 4.31 MIL/uL (ref 4.22–5.81)
RDW: 14.7 % (ref 11.5–15.5)
WBC Count: 8.5 10*3/uL (ref 4.0–10.5)
nRBC: 0 % (ref 0.0–0.2)

## 2022-12-09 LAB — LACTATE DEHYDROGENASE: LDH: 115 U/L (ref 98–192)

## 2022-12-10 ENCOUNTER — Inpatient Hospital Stay (INDEPENDENT_AMBULATORY_CARE_PROVIDER_SITE_OTHER): Payer: Medicare Other | Admitting: Oncology

## 2022-12-10 ENCOUNTER — Encounter: Payer: Self-pay | Admitting: Oncology

## 2022-12-10 VITALS — BP 123/71 | HR 86 | Temp 97.5°F | Resp 20 | Ht 69.0 in | Wt 167.6 lb

## 2022-12-10 DIAGNOSIS — C8331 Diffuse large B-cell lymphoma, lymph nodes of head, face, and neck: Secondary | ICD-10-CM | POA: Diagnosis not present

## 2022-12-10 DIAGNOSIS — T451X5A Adverse effect of antineoplastic and immunosuppressive drugs, initial encounter: Secondary | ICD-10-CM

## 2022-12-10 DIAGNOSIS — D6481 Anemia due to antineoplastic chemotherapy: Secondary | ICD-10-CM | POA: Diagnosis not present

## 2022-12-10 NOTE — Progress Notes (Addendum)
[facility]  [address] [place],  [place]  [address] [phone]  Clinic Day:  12/10/22  Referring physician: Noni Saupe, MD  ASSESSMENT & PLAN:  Assessment:  1. Stage II diffuse large B-cell lymphoma.  He tolerated treatment with R-mini-CHOP without significant difficulty, and completed 3 cycles at the end of August.  PET imaging revealed remission with no evidence of metabolically active lymphoma.  The mass at the tongue base has resolved.  He completed consolidation radiation in November 2021.  PET imaging from September of 2022 remains stable with no evidence of residual or recurrent disease. There was a stable single 9 mm, weakly hypermetabolic left level 1B lymph node, likely inflammatory.  The latest PET scan from July 15, 2021 shows no evidence of lymphoma recurrence. CT scans from January 2024 show no evidence of recurrent lymphoma. He remains in complete remission.   2. Mildly impaired left ventricular systolic function with an EF between 45-50%. He continues to follow up with Dr. Bing Matter, his cardiologist.    3. Anemia, which is mild.  Iron studies, B12 and folate were all normal. His hemoglobin has decreased from 13.0 to 11.7, but now back up to 12.3. I think he is being unrealistic in his expectations of his capabilities for someone who will turn 89 in November. He is active in the yard and driving to [facility]. He has multiple trips planned such as [place] and [place]. I advised that he drink a boost daily, but fatigue is to be expected and not completely caused by his mild anemia. I don't find any treatable cause and it has improved. I will check for myeloma or hemolytic anemia.  4. Large hiatal hernia.   Plan He notes feeling light-headed when he stands up and has to stand still for a minute until it subsides. He is not on any hypertensive medication. When he was seen 1 month ago, he was found to be anemic with a  hemoglobin going from 13.1 to 11.7. His B-12, folate, and iron levels are normal but his B12 was only 292 so low-normal, especially for someone on B12 injections monthly.  He informed me that he gets a B12 injection once a month and was recently switched to receive it every 3 weeks instead. I agree completely. He has been receiving this for the last 3 years. I informed him to continue the injections every 3 weeks and start an oral B-complex. He has become very worried about being anemic and wants it 'fixed'. I have not found any treatable cause. I have reassured him that this is mild and has already improved since last month. The main thing different is that he is drinking boost occasionally. His WBC is 8.5, hemoglobin is 12.3 up from 11.7, platelet count of 199,000., his MCV is 91.0 as of 12/09/2022.  His CMP is nearly normal besides a mildly low calcium of 8.8 and protein of 6.3. I instructed him to drink a boost once daily. He denies bleeding in the urine, bowels, and nose. During his physical exam I noticed a ulceration at the posterior right ankle measuring 2cm across and he informed me that he had a skin cancer removed. It is healing but very slowly and occasionally drains. I will see him back in 2 months with CBC and CMP. He agrees with this plan and his questions were answered. He was instructed to call sooner if concerns arise regarding his lymphoma.  I provided 21 minutes of face-to-face  time during this this encounter and > 50% was spent counseling as documented under my assessment and plan.    Dellia Beckwith, MD [facility] AT [facility] [address] [place] [address] Dept: [phone] Dept Fax: [phone]    CHIEF COMPLAINT:  CC: History of stage IIA large B-cell lymphoma  Current Treatment:  Surveillance   HISTORY OF PRESENT ILLNESS:  Ryan Ball is a 89 y.o. male with stage IIA diffuse large B-cell lymphoma  diagnosed in June 2021.  He presented with voice changes and dysphagia.  CT imaging revealed 2.4 x 3.9 x 4.8 cm posterior tongue base mass, as well as mild bilateral cervical lymphadenopathy.  He saw [name] and underwent biopsy, which revealed diffuse large B-cell lymphoma, non-germinal center phenotype, which was CD45, CD19, CD20 and CD38 positive.  PET-CT revealed hypermetabolic tongue base mass measuring 4.8 x 2.1 cm with an SUV of 15.5 with bilateral cervical lymph nodes measuring 1.5 cm with an SUV max of 11.3.  He was seen by [name] at [facility].  She recommended starting with R-mini-CHOP given every 3 weeks, increasing the doses of chemotherapy with each cycle as tolerated.  Prior to chemotherapy he had an echocardiogram, which revealed mild concentric left ventricular hypertrophy, with mild global hypokinesis of the left ventricle and overall mildly impaired left ventricular systolic function with an ejection fraction of 45-50%.  He had his 1st cycle of R-mini-CHOP, with the chemotherapy given at 50% dose reduction, on July 12th. He received his second cycle at 75% dose and a third cycle at 90%. PET imaging from September 2021 revealed no evidence of metabolically active lymphoma within the neck, chest, abdomen or pelvis.  The previously demonstrated mass at the base of the tongue has resolved.  He was referred for involved field radiation for consolidation.  PET imaging from February 2022 revealed a stable exam with no evidence for metabolically active lymphoma in the neck, chest, abdomen, or pelvis.  He had a skin cancer removed at [name] office, and underwent Mohs surgery of the right lower leg.  INTERVAL HISTORY:  I have reviewed his chart and materials related to his cancer extensively and collaborated history with the patient. Summary of oncologic history is as follows: Oncology History  Diffuse large B-cell lymphoma (HCC)  10/19/2019 Cancer Staging   Staging  form: Hodgkin and Non-Hodgkin Lymphoma, AJCC 8th Edition - Clinical stage from 10/19/2019: Stage II (Diffuse large B-cell lymphoma) - Signed by Dellia Beckwith, MD on 04/13/2020 Prognostic indicators: 4.8 cm mass of posterior tongue base, CR on PET after 3 cycles R-CHOP   10/30/2019 Initial Diagnosis   Diffuse large B-cell lymphoma (HCC)    INTERVAL HISTORY: Ryan Ball is here for routine follow up for stage IIA large B-cell lymphoma diagnosed in June, 2021. Patient states that he feels ok but complains of weakness and fatigue. He notes feeling light-headed when he stands up and has to stand still for a minute until it subsides. He is not on any hypertensive medication. He informed me that this constant feeling is interfering with his heart exercises as he can't walk as far without being tired. When he was seen 1 month ago, he was found to be anemic with a hemoglobin going from 13.1 to 11.7. His B-12, folate, and iron levels are normal but his B12 was only 292 so low-normal, especially for someone on B12 injections monthly.  He informed me that he gets  a B12 injection once a month and was recently switched to receive it every 3 weeks instead. I agree completely. He has been receiving this for the last 3 years. I informed him to continue the injections every 3 weeks and start a oral B-complex. He has become very worried about being anemic and wants it 'fixed'. I have reassured him that this is mild as this has already improved since last month. I find no other treatable cause. The main thing different is that he is drinking boost occasionally. His WBC is 8.5, hemoglobin is 12.3 up from 11.7, platelet count of 199,000., his MCV is 91.0 as of 12/09/2022.  His CMP is nearly normal besides a mildly low calcium of 8.8 and protein of 6.3. He denies bleeding in the urine, bowels, and nose. During his physical exam I noticed a ulceration at the posterior right ankle measuring 2cm across and he informed me that he had  a skin cancer removed. It is healing but very slowly and occasionally drains. I will see him back in 2 months with CBC and CMP. He denies signs of infection such as sore throat, sinus drainage, cough, or urinary symptoms.  He denies fevers or recurrent chills. He denies pain. He denies nausea, vomiting, chest pain, dyspnea or cough. His appetite is the same as usual and his weight has increased 2 pounds over last month . For completeness I will check for myeloma or hemolytic anemia.  HISTORY:   Allergies: No Known Allergies  Current Medications: Current Outpatient Medications  Medication Sig Dispense Refill   alendronate (FOSAMAX) 10 MG tablet Take 10 mg by mouth every other day.     doxepin (SINEQUAN) 25 MG capsule Take 1 capsule by mouth at bedtime as needed (Sleeping).     REPLESTA 1.25 MG (50000 UT) WAFR Inject 5,000 Units into the skin every 14 (fourteen) days.     rosuvastatin (CRESTOR) 5 MG tablet Take 1 tablet by mouth every other day.      No current facility-administered medications for this visit.    REVIEW OF SYSTEMS:  Review of Systems  Constitutional:  Positive for fatigue. Negative for appetite change, chills, diaphoresis, fever and unexpected weight change.  HENT:   Positive for hearing loss. Negative for lump/mass, mouth sores, nosebleeds, sore throat, tinnitus, trouble swallowing and voice change.   Eyes: Negative.  Negative for eye problems and icterus.  Respiratory: Negative.  Negative for chest tightness, cough, hemoptysis, shortness of breath and wheezing.   Cardiovascular: Negative.  Negative for chest pain, leg swelling and palpitations.  Gastrointestinal: Negative.  Negative for abdominal distention, abdominal pain, blood in stool, constipation, diarrhea, nausea, rectal pain and vomiting.  Endocrine: Negative.  Negative for hot flashes.  Genitourinary: Negative.  Negative for bladder incontinence, difficulty urinating, dyspareunia, dysuria, frequency, hematuria,  nocturia, pelvic pain and penile discharge.   Musculoskeletal:  Negative for arthralgias, back pain, flank pain, gait problem, myalgias, neck pain and neck stiffness.  Skin: Negative.  Negative for itching, rash and wound.  Neurological:  Positive for extremity weakness and light-headedness. Negative for dizziness, gait problem, headaches, numbness, seizures and speech difficulty.  Hematological: Negative.  Negative for adenopathy. Does not bruise/bleed easily.  Psychiatric/Behavioral: Negative.  Negative for confusion, decreased concentration, depression, sleep disturbance and suicidal ideas. The patient is not nervous/anxious.   All other systems reviewed and are negative.    VITALS:  Blood pressure 123/71, pulse 86, temperature (!) 97.5 F (36.4 C), temperature source Oral, resp. rate 20, height 5'  9" (1.753 m), weight 167 lb 9.6 oz (76 kg), SpO2 97%.  Wt Readings from Last 3 Encounters:  12/16/22 167 lb 6.4 oz (75.9 kg)  12/10/22 167 lb 9.6 oz (76 kg)  11/15/22 165 lb 8 oz (75.1 kg)    Body mass index is 24.75 kg/m.  Performance status (ECOG): 0 - Asymptomatic  PHYSICAL EXAM:  Physical Exam Vitals and nursing note reviewed. Exam conducted with a chaperone present.  Constitutional:      General: He is not in acute distress.    Appearance: Normal appearance. He is normal weight. He is not ill-appearing, toxic-appearing or diaphoretic.  HENT:     Head: Normocephalic and atraumatic.     Right Ear: Tympanic membrane, ear canal and external ear normal. There is no impacted cerumen.     Left Ear: Tympanic membrane, ear canal and external ear normal. There is no impacted cerumen.     Ears:     Comments: Hard of hearing    Nose: Nose normal. No congestion or rhinorrhea.     Mouth/Throat:     Mouth: Mucous membranes are moist.     Pharynx: Posterior oropharyngeal erythema present. No oropharyngeal exudate.     Comments: Mild erythema in the right posterior pharynx. Eyes:      General: No scleral icterus.       Right eye: No discharge.        Left eye: No discharge.     Extraocular Movements: Extraocular movements intact.     Conjunctiva/sclera: Conjunctivae normal.     Pupils: Pupils are equal, round, and reactive to light.  Neck:     Vascular: No carotid bruit.  Cardiovascular:     Rate and Rhythm: Normal rate and regular rhythm.     Pulses: Normal pulses.     Heart sounds: Normal heart sounds. No murmur heard.    No friction rub. No gallop.  Pulmonary:     Effort: Pulmonary effort is normal. No respiratory distress.     Breath sounds: Normal breath sounds. No stridor. No wheezing, rhonchi or rales.  Chest:     Chest wall: No tenderness.  Abdominal:     General: Bowel sounds are normal. There is no distension.     Palpations: Abdomen is soft. There is no mass.     Tenderness: There is no abdominal tenderness. There is no right CVA tenderness, left CVA tenderness, guarding or rebound.     Hernia: No hernia is present.  Musculoskeletal:        General: No swelling, tenderness, deformity or signs of injury. Normal range of motion.     Cervical back: Normal range of motion and neck supple. No rigidity or tenderness.     Right lower leg: No edema.     Left lower leg: No edema.  Lymphadenopathy:     Cervical: No cervical adenopathy.  Skin:    General: Skin is warm and dry.     Coloration: Skin is not jaundiced or pale.     Findings: No bruising, erythema, lesion or rash.     Comments: Ulceration at the posterior right ankle 2cm across it is healing but very slowly.    Neurological:     General: No focal deficit present.     Mental Status: He is alert and oriented to person, place, and time. Mental status is at baseline.     Cranial Nerves: No cranial nerve deficit.     Sensory: No sensory deficit.  Motor: No weakness.     Coordination: Coordination normal.     Gait: Gait normal.     Deep Tendon Reflexes: Reflexes normal.  Psychiatric:         Mood and Affect: Mood normal.        Behavior: Behavior normal.        Thought Content: Thought content normal.        Judgment: Judgment normal.     LABS:      Latest Ref Rng & Units 12/09/2022    1:47 PM 11/15/2022    2:29 PM 05/13/2022   12:00 AM  CBC  WBC 4.0 - 10.5 K/uL 8.5  7.5  8.3      Hemoglobin 13.0 - 17.0 g/dL 16.1  09.6  04.5      Hematocrit 39.0 - 52.0 % 39.2  36.4  40      Platelets 150 - 400 K/uL 199  191  206         This result is from an external source.      Latest Ref Rng & Units 12/09/2022    1:47 PM 11/15/2022    2:29 PM 05/13/2022   12:00 AM  CMP  Glucose 70 - 99 mg/dL 409  811    BUN 8 - 23 mg/dL 14  19  10       Creatinine 0.61 - 1.24 mg/dL 9.14  7.82  0.8      Sodium 135 - 145 mmol/L 140  136  136      Potassium 3.5 - 5.1 mmol/L 3.8  4.1  4.5      Chloride 98 - 111 mmol/L 104  105  107      CO2 22 - 32 mmol/L 27  25  23       Calcium 8.9 - 10.3 mg/dL 8.8  8.9  8.7      Total Protein 6.5 - 8.1 g/dL 6.3  6.4    Total Bilirubin 0.3 - 1.2 mg/dL 0.7  0.4    Alkaline Phos 38 - 126 U/L 53  58  69      AST 15 - 41 U/L 22  18  38      ALT 0 - 44 U/L 18  14  18          This result is from an external source.    Lab Results  Component Value Date   TIBC 309 11/16/2022   TIBC 282 05/13/2022   TIBC 312 12/24/2021   FERRITIN 34 11/16/2022   FERRITIN 68.4 05/13/2022   FERRITIN 29 12/24/2021   IRONPCTSAT 30 11/16/2022   IRONPCTSAT 39 05/13/2022   IRONPCTSAT 27 12/24/2021   Lab Results  Component Value Date   LDH 115 12/09/2022   LDH 109 11/15/2022   LDH 102 12/23/2021   Component Ref Range & Units 12/09/2022  Retic Ct Pct 0.4 - 3.1 % 0.9  RBC. 4.22 - 5.81 MIL/uL 4.26  Retic Count, Absolute 19.0 - 186.0 K/uL 39.2  Immature Retic Fract 2.3 - 15.9 % 6.      STUDIES:  Exam: 05/13/22 CT Neck with Contrast Impression: No appreciable residual/recurrent tongue base mass. No cervical lymphadenopathy. Submandibular gland atrophy. Aortic  Atherosclerosis (ICD10-170.0). Atherosclerotic plaque is also present within the proximal major branch vessels of the neck and carotid arteries. Same day CT chest/abdomen/pelvis separately reported.   Exam: 05/13/2022 CT Chest, Abdomen, and Pelvis with Contrast impression No developing new mass lesion, fluid collection or lymph node enlargement. No  splenomegaly. Acute appearing right anterolateral seventh rib fracture. Please correlate with any known history.  Large hiatal hernia. Extensive colonic diverticulosis. Non-obstructing left-sided renal stone. A routine call report will be initiated by radiology assistant team    Rulon Sera Lassiter,acting as a scribe for Dellia Beckwith, MD.,have documented all relevant documentation on the behalf of Dellia Beckwith, MD,as directed by  Dellia Beckwith, MD while in the presence of Dellia Beckwith, MD.

## 2022-12-16 ENCOUNTER — Ambulatory Visit: Payer: Medicare Other | Attending: Cardiology | Admitting: Cardiology

## 2022-12-16 ENCOUNTER — Encounter: Payer: Self-pay | Admitting: Cardiology

## 2022-12-16 VITALS — BP 138/68 | HR 96 | Ht 69.0 in | Wt 167.4 lb

## 2022-12-16 DIAGNOSIS — I420 Dilated cardiomyopathy: Secondary | ICD-10-CM

## 2022-12-16 DIAGNOSIS — D6481 Anemia due to antineoplastic chemotherapy: Secondary | ICD-10-CM

## 2022-12-16 DIAGNOSIS — I471 Supraventricular tachycardia, unspecified: Secondary | ICD-10-CM | POA: Diagnosis not present

## 2022-12-16 DIAGNOSIS — C8331 Diffuse large B-cell lymphoma, lymph nodes of head, face, and neck: Secondary | ICD-10-CM

## 2022-12-16 DIAGNOSIS — I25118 Atherosclerotic heart disease of native coronary artery with other forms of angina pectoris: Secondary | ICD-10-CM | POA: Diagnosis not present

## 2022-12-16 DIAGNOSIS — I350 Nonrheumatic aortic (valve) stenosis: Secondary | ICD-10-CM | POA: Insufficient documentation

## 2022-12-16 DIAGNOSIS — T451X5A Adverse effect of antineoplastic and immunosuppressive drugs, initial encounter: Secondary | ICD-10-CM | POA: Insufficient documentation

## 2022-12-16 NOTE — Progress Notes (Signed)
Cardiology Office Note:    Date:  12/16/2022   ID:  [name], DOB Dec 29, 1930, MRN [id number]  PCP:  Noni Saupe, MD  Cardiologist:  Gypsy Balsam, MD    Referring MD: Noni Saupe, MD   Chief Complaint  Patient presents with   Follow-up    History of Present Illness:    Ryan Ball is a 89 y.o. male with past medical history significant for cardiomyopathy ejection fraction 45% in summer 2021, hemodynamically insignificant aortic stenosis, his ejection fraction normalized in September 2023, at that time he got stress test done which showed minimal area of ischemia but since he was completely asymptomatic medical therapy which we will continue.  Additional problem include diffuse large B-cell lymphoma status postchemotherapy that we follow like always excellently by our oncology team.  Essential hypertension dyslipidemia. Comes today to my office for follow-up he said he was at the beach and he became very weak tired and exhausted over there.  He was find to have significant anemia he is iron has been supplemented he is doing better still not to the point that he was before that she was able [place] walk around the stadium many times a day.  Denies have any chest pain tightness squeezing pressure burning chest no dizziness no passing out.  Past Medical History:  Diagnosis Date   Anemia 11/16/2022   Anemia due to antineoplastic chemotherapy 04/13/2020   Atypical chest pain 10/10/2018   Cardiomegaly 06/08/2016   Diffuse large B-cell lymphoma (HCC) 10/30/2019   Dilated cardiomyopathy (HCC) ejection fraction 45% in summer 2021 by echo 05/20/2020   Encounter for venous access device care 04/09/2020   Esophageal reflux 10/10/2018   Hypercholesteremia 10/10/2018   Postoperative examination 12/12/2019   Senile nuclear sclerosis 05/19/2012   Senile nuclear sclerosis, bilateral 11/02/2019   Sleep disturbance 10/10/2018   SVT (supraventricular tachycardia)  09/20/2019    Past Surgical History:  Procedure Laterality Date   CHOLECYSTECTOMY      Current Medications: Current Meds  Medication Sig   alendronate (FOSAMAX) 10 MG tablet Take 10 mg by mouth every other day.   doxepin (SINEQUAN) 25 MG capsule Take 1 capsule by mouth at bedtime as needed (Sleeping).   REPLESTA 1.25 MG (50000 UT) WAFR Inject 5,000 Units into the skin every 14 (fourteen) days.   rosuvastatin (CRESTOR) 5 MG tablet Take 1 tablet by mouth every other day.    [DISCONTINUED] esomeprazole (NEXIUM) 40 MG capsule Take 40 mg by mouth daily at 12 noon.   [DISCONTINUED] Methylcobalamin 95284 MCG SOLR Inject 1 tablet as directed See admin instructions.   [DISCONTINUED] nitroGLYCERIN (NITROSTAT) 0.4 MG SL tablet Place 1 tablet (0.4 mg total) under the tongue every 5 (five) minutes as needed for chest pain.     Allergies:   Patient has no known allergies.   Social History   Socioeconomic History   Marital status: Widowed    Spouse name: Not on file   Number of children: Not on file   Years of education: Not on file   Highest education level: Not on file  Occupational History   Not on file  Tobacco Use   Smoking status: Never   Smokeless tobacco: Never  Substance and Sexual Activity   Alcohol use: Yes    Alcohol/week: 6.0 standard drinks of alcohol    Types: 6 Glasses of wine per week    Comment: 1-2 glasses of wine 2-3 times a week   Drug use: Never  Sexual activity: Not on file  Other Topics Concern   Not on file  Social History Narrative   Not on file   Social Determinants of Health   Financial Resource Strain: Not on file  Food Insecurity: Not on file  Transportation Needs: Not on file  Physical Activity: Not on file  Stress: Not on file  Social Connections: Not on file     Family History: The patient's family history includes CAD in his brother and father. ROS:   Please see the history of present illness.    All 14 point review of systems negative  except as described per history of present illness  EKGs/Labs/Other Studies Reviewed:         Recent Labs: 12/09/2022: ALT 18; BUN 14; Creatinine 1.10; Hemoglobin 12.3; Platelet Count 199; Potassium 3.8; Sodium 140  Recent Lipid Panel No results found for: "CHOL", "TRIG", "HDL", "CHOLHDL", "VLDL", "LDLCALC", "LDLDIRECT"  Physical Exam:    VS:  BP 138/68 (BP Location: Left Arm, Patient Position: Sitting)   Pulse 96   Ht 5\' 9"  (1.753 m)   Wt 167 lb 6.4 oz (75.9 kg)   SpO2 99%   BMI 24.72 kg/m     Wt Readings from Last 3 Encounters:  12/16/22 167 lb 6.4 oz (75.9 kg)  12/10/22 167 lb 9.6 oz (76 kg)  11/15/22 165 lb 8 oz (75.1 kg)     GEN:  Well nourished, well developed in no acute distress HEENT: Normal NECK: No JVD; No carotid bruits LYMPHATICS: No lymphadenopathy CARDIAC: RRR, systolic ejection murmur grade 2/6 best heard right upper portion of the sternum, no rubs, no gallops RESPIRATORY:  Clear to auscultation without rales, wheezing or rhonchi  ABDOMEN: Soft, non-tender, non-distended MUSCULOSKELETAL:  No edema; No deformity  SKIN: Warm and dry LOWER EXTREMITIES: no swelling NEUROLOGIC:  Alert and oriented x 3 PSYCHIATRIC:  Normal affect   ASSESSMENT:    1. Nonrheumatic aortic valve stenosis   2. Coronary artery disease of native artery of native heart with stable angina pectoris (HCC)   3. SVT (supraventricular tachycardia)   4. Dilated cardiomyopathy (HCC) ejection fraction 45% in summer 2021 by echo   5. Anemia due to antineoplastic chemotherapy   6. Diffuse large B-cell lymphoma of lymph nodes of neck (HCC)    PLAN:    In order of problems listed above:  Nonrheumatic arctic valve stenosis: Will repeat echocardiogram S2 is still present so I do not think we dealing with critical stenosis but degree of the stenosis need to be assessed since last echocardiogram being done 2 years ago.  Previously did not want to have repeated echocardiogram since he was doing  fine now he agree so echocardiogram will be done. History of cardiomyopathy again echocardiogram will be done to assess left ventricle ejection fraction. Anemia follow-up by oncology team.  He is doing better overall he said. History of SVT denies having any sustained arrhythmia   Medication Adjustments/Labs and Tests Ordered: Current medicines are reviewed at length with the patient today.  Concerns regarding medicines are outlined above.  No orders of the defined types were placed in this encounter.  Medication changes: No orders of the defined types were placed in this encounter.   Signed, Georgeanna Lea, MD, [facility] 12/16/2022 1:15 PM    [facility]

## 2022-12-16 NOTE — Addendum Note (Signed)
Addended by: Baldo Ash D on: 12/16/2022 01:19 PM   Modules accepted: Orders

## 2022-12-16 NOTE — Patient Instructions (Signed)

## 2022-12-23 ENCOUNTER — Encounter: Payer: Self-pay | Admitting: Oncology

## 2022-12-27 DIAGNOSIS — D519 Vitamin B12 deficiency anemia, unspecified: Secondary | ICD-10-CM | POA: Diagnosis not present

## 2023-01-11 ENCOUNTER — Ambulatory Visit: Payer: Medicare Other | Attending: Cardiology

## 2023-01-11 DIAGNOSIS — I420 Dilated cardiomyopathy: Secondary | ICD-10-CM | POA: Insufficient documentation

## 2023-01-11 LAB — ECHOCARDIOGRAM COMPLETE
AR max vel: 1.45 cm2
AV Area VTI: 1.48 cm2
AV Area mean vel: 1.38 cm2
AV Mean grad: 10 mmHg
AV Peak grad: 17.7 mmHg
Ao pk vel: 2.1 m/s
Calc EF: 53 %
P 1/2 time: 579 ms
S' Lateral: 2.8 cm
Single Plane A2C EF: 53.5 %
Single Plane A4C EF: 52.2 %

## 2023-01-13 ENCOUNTER — Telehealth: Payer: Self-pay

## 2023-01-13 NOTE — Telephone Encounter (Signed)
Echo Results reviewed with pt as per Dr. Krasowski's note.  Pt verbalized understanding and had no additional questions. Routed to PCP 

## 2023-01-25 DIAGNOSIS — Z0000 Encounter for general adult medical examination without abnormal findings: Secondary | ICD-10-CM | POA: Diagnosis not present

## 2023-01-25 DIAGNOSIS — E7800 Pure hypercholesterolemia, unspecified: Secondary | ICD-10-CM | POA: Diagnosis not present

## 2023-01-25 DIAGNOSIS — E559 Vitamin D deficiency, unspecified: Secondary | ICD-10-CM | POA: Diagnosis not present

## 2023-01-25 DIAGNOSIS — D649 Anemia, unspecified: Secondary | ICD-10-CM | POA: Diagnosis not present

## 2023-01-31 DIAGNOSIS — L570 Actinic keratosis: Secondary | ICD-10-CM | POA: Diagnosis not present

## 2023-02-17 DIAGNOSIS — Z23 Encounter for immunization: Secondary | ICD-10-CM | POA: Diagnosis not present

## 2023-02-23 ENCOUNTER — Other Ambulatory Visit: Payer: Self-pay | Admitting: Hematology and Oncology

## 2023-02-23 ENCOUNTER — Inpatient Hospital Stay: Payer: Medicare Other | Admitting: Hematology and Oncology

## 2023-02-23 ENCOUNTER — Inpatient Hospital Stay: Payer: Medicare Other

## 2023-02-23 DIAGNOSIS — C8331 Diffuse large B-cell lymphoma, lymph nodes of head, face, and neck: Secondary | ICD-10-CM

## 2023-02-23 DIAGNOSIS — D649 Anemia, unspecified: Secondary | ICD-10-CM

## 2023-02-24 NOTE — Progress Notes (Signed)
[facility]  [address] [place],  [place]  [address] [phone]  Clinic Day:  02/25/23  Referring physician: Noni Saupe, MD  ASSESSMENT & PLAN:  Assessment:  1. Stage II diffuse large B-cell lymphoma.  He tolerated treatment with R-mini-CHOP without significant difficulty, and completed 3 cycles at the end of August.  PET imaging revealed remission with no evidence of metabolically active lymphoma.  The mass at the tongue base has resolved.  He completed consolidation radiation in November 2021.  PET imaging from September of 2022 remains stable with no evidence of residual or recurrent disease. There was a stable single 9 mm, weakly hypermetabolic left level 1B lymph node, likely inflammatory.  The latest PET scan from July 15, 2021 shows no evidence of lymphoma recurrence. CT scans from January 2024 show no evidence of recurrent lymphoma. He remains in complete remission.   2. Mildly impaired left ventricular systolic function with an EF between 45-50%. He continues to follow up with Dr. Bing Matter, his cardiologist.    3. Anemia, which is mild.  Iron studies, B12 and folate were all normal, but his B-12 was low normal for someone on regular injections. His SPEP was negative for a monoclonal spike I will check a haptoglobin today to rule out hemolytic anemia and repeat his B-12.   4. Large hiatal hernia.   Plan I had seen him in August for evaluation of anemia and iron studies were good, folate normal, SPEP had no M-spike, but his B-12 was low normal. Dr. Jeanie Sewer recommended that he get injections every 3 weeks but his insurance will only cover monthly. We have repeated a level today. He informed me that his hearing aid is broken and he is waiting on a replacement. He has been getting regular exercise by walking. His BP today is 156/79 today and he informed me his BP is usually elevated when he goes to the doctors. He informed me that his doctor  told him to stop taking Alendronate as it had side effects on his teeth. His last CT scan was in January, 2024. His labs today are pending and he receives a B-12 injection once a month. I will see him back in 5 months with CBC, CMP, LDH, and repeat CT neck, chest, abdomen and pelvis. He agrees with this plan and his questions were answered. He was instructed to call sooner if concerns arise regarding his lymphoma.  I provided 21 minutes of face-to-face time during this this encounter and > 50% was spent counseling as documented under my assessment and plan.   Dellia Beckwith, MD [facility] [address] [place] [address] Dept: [phone] Dept Fax: [phone]    CHIEF COMPLAINT:  CC: History of stage IIA large B-cell lymphoma  Current Treatment:  Surveillance   HISTORY OF PRESENT ILLNESS:  Ryan Ball is a 89 y.o. male with stage IIA diffuse large B-cell lymphoma diagnosed in June 2021.  He presented with voice changes and dysphagia.  CT imaging revealed 2.4 x 3.9 x 4.8 cm posterior tongue base mass, as well as mild bilateral cervical lymphadenopathy.  He saw [name] and underwent biopsy, which revealed diffuse large B-cell lymphoma, non-germinal center phenotype, which was CD45, CD19, CD20 and CD38 positive.  PET-CT revealed hypermetabolic tongue base mass measuring 4.8 x 2.1 cm with an SUV of 15.5 with bilateral cervical lymph nodes measuring 1.5 cm with an SUV max  of 11.3.  He was seen by [name] at [facility].  She recommended starting with R-mini-CHOP given every 3 weeks, increasing the doses of chemotherapy with each cycle as tolerated.  Prior to chemotherapy he had an echocardiogram, which revealed mild concentric left ventricular hypertrophy, with mild global hypokinesis of the left ventricle and overall mildly impaired left ventricular systolic function with an ejection fraction  of 45-50%.  He had his 1st cycle of R-mini-CHOP, with the chemotherapy given at 50% dose reduction, on July 12th. He received his second cycle at 75% dose and a third cycle at 90%. PET imaging from September 2021 revealed no evidence of metabolically active lymphoma within the neck, chest, abdomen or pelvis.  The previously demonstrated mass at the base of the tongue has resolved.  He was referred for involved field radiation for consolidation.  PET imaging from February 2022 revealed a stable exam with no evidence for metabolically active lymphoma in the neck, chest, abdomen, or pelvis.  He had a skin cancer removed at [name] office, and underwent Mohs surgery of the right lower leg.  INTERVAL HISTORY:  I have reviewed his chart and materials related to his cancer extensively and collaborated history with the patient. Summary of oncologic history is as follows: Oncology History  Diffuse large B-cell lymphoma (HCC)  10/19/2019 Cancer Staging   Staging form: Hodgkin and Non-Hodgkin Lymphoma, AJCC 8th Edition - Clinical stage from 10/19/2019: Stage II (Diffuse large B-cell lymphoma) - Signed by Dellia Beckwith, MD on 04/13/2020 Prognostic indicators: 4.8 cm mass of posterior tongue base, CR on PET after 3 cycles R-CHOP   10/30/2019 Initial Diagnosis   Diffuse large B-cell lymphoma (HCC)    INTERVAL HISTORY: Ryan Ball is here for routine follow up for his history of stage IIA large B-cell lymphoma diagnosed in June, 2021. I had seen him in August for evaluation of anemia and iron studies were good, folate normal, SPEP had no M-spike, but his B-12 was low normal. Dr. Jeanie Sewer recommended that he get injections every 3 weeks but his insurance will only cover monthly. We have repeated a level today. Patient states that he feels well and has no complaints of pain. He informed me that his hearing aid is broken and he is waiting on a replacement. He has been getting regular exercise by walking. His BP  today is 156/79 today and he informed me his BP is usually elevated when he goes to the doctors. He informed me that his doctor told him to stop taking Alendronate as it had side effects on his teeth. His last CT scan was in January, 2024. His labs today are pending and he receives a B-12 injection once a month. I will see him back in 5 months with CBC, CMP, LDH, and repeat CT neck, chest, abdomen and pelvis. He denies signs of infection such as sore throat, sinus drainage, cough, or urinary symptoms.  He denies fevers or recurrent chills. He denies pain. He denies nausea, vomiting, chest pain, dyspnea or cough. His appetite is good and his weight has increased 2 pounds over last 2 months .  HISTORY:   Allergies: No Known Allergies  Current Medications: Current Outpatient Medications  Medication Sig Dispense Refill   doxepin (SINEQUAN) 25 MG capsule Take 1 capsule by mouth at bedtime as needed (Sleeping).     REPLESTA 1.25 MG (50000 UT) WAFR Inject 5,000 Units into the skin every 14 (fourteen) days.     rosuvastatin (CRESTOR) 5 MG  tablet Take 1 tablet by mouth every other day.      No current facility-administered medications for this visit.    REVIEW OF SYSTEMS:  Review of Systems  Constitutional:  Positive for fatigue. Negative for appetite change, chills, diaphoresis, fever and unexpected weight change.  HENT:   Positive for hearing loss. Negative for lump/mass, mouth sores, nosebleeds, sore throat, tinnitus, trouble swallowing and voice change.   Eyes: Negative.  Negative for eye problems and icterus.  Respiratory: Negative.  Negative for chest tightness, cough, hemoptysis, shortness of breath and wheezing.   Cardiovascular: Negative.  Negative for chest pain, leg swelling and palpitations.  Gastrointestinal: Negative.  Negative for abdominal distention, abdominal pain, blood in stool, constipation, diarrhea, nausea, rectal pain and vomiting.  Endocrine: Negative.  Negative for hot  flashes.  Genitourinary: Negative.  Negative for bladder incontinence, difficulty urinating, dyspareunia, dysuria, frequency, hematuria, nocturia, pelvic pain and penile discharge.   Musculoskeletal:  Negative for arthralgias, back pain, flank pain, gait problem, myalgias, neck pain and neck stiffness.  Skin: Negative.  Negative for itching, rash and wound.  Neurological: Negative.  Negative for dizziness, extremity weakness, gait problem, headaches, light-headedness, numbness, seizures and speech difficulty.  Hematological: Negative.  Negative for adenopathy. Does not bruise/bleed easily.  Psychiatric/Behavioral: Negative.  Negative for confusion, decreased concentration, depression, sleep disturbance and suicidal ideas. The patient is not nervous/anxious.   All other systems reviewed and are negative.    VITALS:  Blood pressure (!) 156/79, pulse 80, temperature 97.6 F (36.4 C), temperature source Oral, resp. rate 18, height 5\' 9"  (1.753 m), weight 169 lb 4.8 oz (76.8 kg), SpO2 98%.  Wt Readings from Last 3 Encounters:  02/25/23 169 lb 4.8 oz (76.8 kg)  12/16/22 167 lb 6.4 oz (75.9 kg)  12/10/22 167 lb 9.6 oz (76 kg)    Body mass index is 25 kg/m.  Performance status (ECOG): 0 - Asymptomatic  PHYSICAL EXAM:  Physical Exam Vitals and nursing note reviewed. Exam conducted with a chaperone present.  Constitutional:      General: He is not in acute distress.    Appearance: Normal appearance. He is normal weight. He is not ill-appearing, toxic-appearing or diaphoretic.  HENT:     Head: Normocephalic and atraumatic.     Right Ear: Tympanic membrane, ear canal and external ear normal. There is no impacted cerumen.     Left Ear: Tympanic membrane, ear canal and external ear normal. There is no impacted cerumen.     Ears:     Comments: Hard of hearing    Nose: Nose normal. No congestion or rhinorrhea.     Mouth/Throat:     Mouth: Mucous membranes are moist.     Pharynx: No  oropharyngeal exudate or posterior oropharyngeal erythema.  Eyes:     General: No scleral icterus.       Right eye: No discharge.        Left eye: No discharge.     Extraocular Movements: Extraocular movements intact.     Conjunctiva/sclera: Conjunctivae normal.     Pupils: Pupils are equal, round, and reactive to light.  Neck:     Vascular: No carotid bruit.  Cardiovascular:     Rate and Rhythm: Normal rate and regular rhythm.     Pulses: Normal pulses.     Heart sounds: Murmur heard.     Systolic murmur is present with a grade of 2/6.     No friction rub. No gallop.  Pulmonary:  Effort: Pulmonary effort is normal. No respiratory distress.     Breath sounds: Normal breath sounds. No stridor. No wheezing, rhonchi or rales.  Chest:     Chest wall: No tenderness.  Abdominal:     General: Bowel sounds are normal. There is no distension.     Palpations: Abdomen is soft. There is no mass.     Tenderness: There is no abdominal tenderness. There is no right CVA tenderness, left CVA tenderness, guarding or rebound.     Hernia: No hernia is present.  Musculoskeletal:        General: No swelling, tenderness, deformity or signs of injury. Normal range of motion.     Cervical back: Normal range of motion and neck supple. No rigidity or tenderness.     Right lower leg: No edema.     Left lower leg: No edema.  Lymphadenopathy:     Cervical: No cervical adenopathy.  Skin:    General: Skin is warm and dry.     Coloration: Skin is not jaundiced or pale.     Findings: No bruising, erythema, lesion or rash.  Neurological:     General: No focal deficit present.     Mental Status: He is alert and oriented to person, place, and time. Mental status is at baseline.     Cranial Nerves: No cranial nerve deficit.     Sensory: No sensory deficit.     Motor: No weakness.     Coordination: Coordination normal.     Gait: Gait normal.     Deep Tendon Reflexes: Reflexes normal.  Psychiatric:         Mood and Affect: Mood normal.        Behavior: Behavior normal.        Thought Content: Thought content normal.        Judgment: Judgment normal.     LABS:      Latest Ref Rng & Units 02/25/2023    4:07 PM 12/09/2022    1:47 PM 11/15/2022    2:29 PM  CBC  WBC 4.0 - 10.5 K/uL 8.2  8.5  7.5   Hemoglobin 13.0 - 17.0 g/dL 29.5  28.4  13.2   Hematocrit 39.0 - 52.0 % 42.8  39.2  36.4   Platelets 150 - 400 K/uL 239  199  191       Latest Ref Rng & Units 02/25/2023    4:07 PM 12/09/2022    1:47 PM 11/15/2022    2:29 PM  CMP  Glucose 70 - 99 mg/dL 85  440  102   BUN 8 - 23 mg/dL 21  14  19    Creatinine 0.61 - 1.24 mg/dL 7.25  3.66  4.40   Sodium 135 - 145 mmol/L 136  140  136   Potassium 3.5 - 5.1 mmol/L 4.2  3.8  4.1   Chloride 98 - 111 mmol/L 102  104  105   CO2 22 - 32 mmol/L 27  27  25    Calcium 8.9 - 10.3 mg/dL 9.3  8.8  8.9   Total Protein 6.5 - 8.1 g/dL 7.2  6.3  6.4   Total Bilirubin 0.3 - 1.2 mg/dL 0.4  0.7  0.4   Alkaline Phos 38 - 126 U/L 61  53  58   AST 15 - 41 U/L 22  22  18    ALT 0 - 44 U/L 16  18  14      Lab Results  Component Value Date  TIBC 309 11/16/2022   TIBC 282 05/13/2022   TIBC 312 12/24/2021   FERRITIN 32 02/25/2023   FERRITIN 34 11/16/2022   FERRITIN 68.4 05/13/2022   IRONPCTSAT 30 11/16/2022   IRONPCTSAT 39 05/13/2022   IRONPCTSAT 27 12/24/2021   Lab Results  Component Value Date   LDH 115 12/09/2022   LDH 109 11/15/2022   LDH 102 12/23/2021   Component Ref Range & Units 12/09/2022  Retic Ct Pct 0.4 - 3.1 % 0.9  RBC. 4.22 - 5.81 MIL/uL 4.26  Retic Count, Absolute 19.0 - 186.0 K/uL 39.2  Immature Retic Fract 2.3 - 15.9 % 6.     Component Ref Range & Units 12/09/2022  Total Protein ELP 6.0 - 8.5 g/dL 5.8 Low   Albumin ELP 2.9 - 4.4 g/dL 3.6  [id number]-6-[id number] 0.0 - 0.4 g/dL 0.1  XBJYN-8-GNFAOZHY 0.4 - 1.0 g/dL 0.6  Beta Globulin 0.7 - 1.3 g/dL 0.8  Gamma Globulin 0.4 - 1.8 g/dL 0.7  M-Spike, % Not Observed g/dL Not  Observed   Component Ref Range & Units 12/09/2022 3 mo ago 1 yr ago  Haptoglobin 38 - 329 mg/dL 865 784 CM 696 CM     Component Ref Range & Units 2 mo ago (12/09/22) 3 mo ago (11/15/22) 1 yr ago (12/23/21) 1 yr ago (07/22/21) 1 yr ago (04/23/21) 2 yr ago (10/09/20) 2 yr ago (04/09/20)  LDH 98 - 192 U/L 115 109 CM 102 CM 125 CM 119 CM 110 CM 113 CM      STUDIES:  Exam: 05/13/22 CT Neck with Contrast Impression: No appreciable residual/recurrent tongue base mass. No cervical lymphadenopathy. Submandibular gland atrophy. Aortic Atherosclerosis (ICD10-170.0). Atherosclerotic plaque is also present within the proximal major branch vessels of the neck and carotid arteries. Same day CT chest/abdomen/pelvis separately reported.   Exam: 05/13/2022 CT Chest, Abdomen, and Pelvis with Contrast impression No developing new mass lesion, fluid collection or lymph node enlargement. No splenomegaly. Acute appearing right anterolateral seventh rib fracture. Please correlate with any known history.  Large hiatal hernia. Extensive colonic diverticulosis. Non-obstructing left-sided renal stone. A routine call report will be initiated by radiology assistant team    Rulon Sera Lassiter,acting as a scribe for Dellia Beckwith, MD.,have documented all relevant documentation on the behalf of Dellia Beckwith, MD,as directed by  Dellia Beckwith, MD while in the presence of Dellia Beckwith, MD.

## 2023-02-25 ENCOUNTER — Inpatient Hospital Stay (INDEPENDENT_AMBULATORY_CARE_PROVIDER_SITE_OTHER): Payer: Medicare Other | Admitting: Oncology

## 2023-02-25 ENCOUNTER — Encounter: Payer: Self-pay | Admitting: Oncology

## 2023-02-25 ENCOUNTER — Inpatient Hospital Stay: Payer: Medicare Other | Attending: Oncology

## 2023-02-25 ENCOUNTER — Other Ambulatory Visit: Payer: Self-pay | Admitting: Oncology

## 2023-02-25 VITALS — BP 156/79 | HR 80 | Temp 97.6°F | Resp 18 | Ht 69.0 in | Wt 169.3 lb

## 2023-02-25 DIAGNOSIS — D649 Anemia, unspecified: Secondary | ICD-10-CM | POA: Insufficient documentation

## 2023-02-25 DIAGNOSIS — Z8572 Personal history of non-Hodgkin lymphomas: Secondary | ICD-10-CM | POA: Diagnosis not present

## 2023-02-25 DIAGNOSIS — C8331 Diffuse large B-cell lymphoma, lymph nodes of head, face, and neck: Secondary | ICD-10-CM

## 2023-02-25 DIAGNOSIS — K449 Diaphragmatic hernia without obstruction or gangrene: Secondary | ICD-10-CM | POA: Diagnosis not present

## 2023-02-25 DIAGNOSIS — Z08 Encounter for follow-up examination after completed treatment for malignant neoplasm: Secondary | ICD-10-CM | POA: Insufficient documentation

## 2023-02-25 DIAGNOSIS — C833A Diffuse large b-cell lymphoma, in remission: Secondary | ICD-10-CM

## 2023-02-25 LAB — CBC WITH DIFFERENTIAL (CANCER CENTER ONLY)
Abs Immature Granulocytes: 0.02 10*3/uL (ref 0.00–0.07)
Basophils Absolute: 0 10*3/uL (ref 0.0–0.1)
Basophils Relative: 0 %
Eosinophils Absolute: 0.2 10*3/uL (ref 0.0–0.5)
Eosinophils Relative: 2 %
HCT: 42.8 % (ref 39.0–52.0)
Hemoglobin: 13.6 g/dL (ref 13.0–17.0)
Immature Granulocytes: 0 %
Lymphocytes Relative: 16 %
Lymphs Abs: 1.3 10*3/uL (ref 0.7–4.0)
MCH: 29 pg (ref 26.0–34.0)
MCHC: 31.8 g/dL (ref 30.0–36.0)
MCV: 91.3 fL (ref 80.0–100.0)
Monocytes Absolute: 0.9 10*3/uL (ref 0.1–1.0)
Monocytes Relative: 11 %
Neutro Abs: 5.8 10*3/uL (ref 1.7–7.7)
Neutrophils Relative %: 71 %
Platelet Count: 239 10*3/uL (ref 150–400)
RBC: 4.69 MIL/uL (ref 4.22–5.81)
RDW: 13.6 % (ref 11.5–15.5)
WBC Count: 8.2 10*3/uL (ref 4.0–10.5)
nRBC: 0 % (ref 0.0–0.2)

## 2023-02-25 LAB — DIRECT ANTIGLOBULIN TEST (NOT AT ARMC)
DAT, IgG: NEGATIVE
DAT, complement: NEGATIVE

## 2023-02-25 LAB — CMP (CANCER CENTER ONLY)
ALT: 16 U/L (ref 0–44)
AST: 22 U/L (ref 15–41)
Albumin: 4.3 g/dL (ref 3.5–5.0)
Alkaline Phosphatase: 61 U/L (ref 38–126)
Anion gap: 7 (ref 5–15)
BUN: 21 mg/dL (ref 8–23)
CO2: 27 mmol/L (ref 22–32)
Calcium: 9.3 mg/dL (ref 8.9–10.3)
Chloride: 102 mmol/L (ref 98–111)
Creatinine: 1.06 mg/dL (ref 0.61–1.24)
GFR, Estimated: >60 mL/min (ref >60)
Glucose, Bld: 85 mg/dL (ref 70–99)
Potassium: 4.2 mmol/L (ref 3.5–5.1)
Sodium: 136 mmol/L (ref 135–145)
Total Bilirubin: 0.4 mg/dL (ref 0.3–1.2)
Total Protein: 7.2 g/dL (ref 6.5–8.1)

## 2023-02-25 LAB — FERRITIN: Ferritin: 32 ng/mL (ref 24–336)

## 2023-02-25 LAB — VITAMIN B12: Vitamin B-12: 707 pg/mL (ref 180–914)

## 2023-02-27 LAB — HAPTOGLOBIN: Haptoglobin: 158 mg/dL (ref 38–329)

## 2023-03-01 ENCOUNTER — Telehealth: Payer: Self-pay | Admitting: Oncology

## 2023-03-01 LAB — ZINC: Zinc: 61 ug/dL (ref 44–115)

## 2023-03-01 LAB — COPPER, SERUM: Copper: 94 ug/dL (ref 69–132)

## 2023-03-01 NOTE — Telephone Encounter (Signed)
Contacted pt to schedule an appt. Unable to reach via phone, voicemail was left.    Scheduling Message Entered by [name] H on 02/25/2023 at  5:22 PM Priority: Routine <No visit type provided>  Department: [facility]  Provider:  Scheduling Notes:  RT 5 months with labs and CT scans of neck, chest/abd/pelvis to be done 1 week before

## 2023-03-01 NOTE — Telephone Encounter (Signed)
Pt wishes to schedule his next appt as time gets closer. Will defer appt request.

## 2023-03-11 ENCOUNTER — Encounter: Payer: Self-pay | Admitting: Oncology

## 2023-03-11 DIAGNOSIS — D519 Vitamin B12 deficiency anemia, unspecified: Secondary | ICD-10-CM | POA: Diagnosis not present

## 2023-03-30 DIAGNOSIS — L578 Other skin changes due to chronic exposure to nonionizing radiation: Secondary | ICD-10-CM | POA: Diagnosis not present

## 2023-03-30 DIAGNOSIS — L570 Actinic keratosis: Secondary | ICD-10-CM | POA: Diagnosis not present

## 2023-04-21 DIAGNOSIS — K6289 Other specified diseases of anus and rectum: Secondary | ICD-10-CM | POA: Diagnosis not present

## 2023-04-25 DIAGNOSIS — D519 Vitamin B12 deficiency anemia, unspecified: Secondary | ICD-10-CM | POA: Diagnosis not present

## 2023-04-29 DIAGNOSIS — Z6826 Body mass index (BMI) 26.0-26.9, adult: Secondary | ICD-10-CM | POA: Diagnosis not present

## 2023-04-29 DIAGNOSIS — K6289 Other specified diseases of anus and rectum: Secondary | ICD-10-CM | POA: Diagnosis not present

## 2023-04-29 DIAGNOSIS — R300 Dysuria: Secondary | ICD-10-CM | POA: Diagnosis not present

## 2023-05-20 ENCOUNTER — Ambulatory Visit: Payer: Medicare Other | Admitting: Oncology

## 2023-06-27 ENCOUNTER — Telehealth: Payer: Self-pay | Admitting: Oncology

## 2023-06-27 NOTE — Telephone Encounter (Signed)
CT C/A/P/Neck has been scheduled for 07/19/23 @ 2; Checking in @ 1 pm   Notified pt of date,time, instructions and location.

## 2023-07-19 LAB — COMPREHENSIVE METABOLIC PANEL WITH GFR
Albumin: 4.1 (ref 3.5–5.0)
Calcium: 9.1 (ref 8.7–10.7)

## 2023-07-19 LAB — BASIC METABOLIC PANEL WITH GFR
BUN: 17 (ref 4–21)
CO2: 28 — AB (ref 13–22)
Chloride: 101 (ref 99–108)
Creatinine: 1 (ref 0.6–1.3)
Glucose: 103
Potassium: 4.5 meq/L (ref 3.5–5.1)
Sodium: 137 (ref 137–147)

## 2023-07-19 LAB — HEPATIC FUNCTION PANEL
ALT: 15 U/L (ref 10–40)
AST: 30 (ref 14–40)
Alkaline Phosphatase: 61 (ref 25–125)
Bilirubin, Total: 0.7

## 2023-07-19 LAB — CBC: RBC: 4.64 (ref 3.87–5.11)

## 2023-07-19 LAB — CBC AND DIFFERENTIAL
HCT: 41 (ref 41–53)
Hemoglobin: 13.6 (ref 13.5–17.5)
Neutrophils Absolute: 5.68
Platelets: 206 10*3/uL (ref 150–400)
WBC: 8

## 2023-07-22 NOTE — Progress Notes (Signed)
 [facility]  [address] [place],  [place]  [address] [phone]  Clinic Day:  07/26/23  Referring physician: Annamaria Helling, DO  ASSESSMENT & PLAN:  Assessment:  1. Stage II diffuse large B-cell lymphoma.  He tolerated treatment with R-mini-CHOP without significant difficulty, and completed 3 cycles at the end of August.  PET imaging revealed remission with no evidence of metabolically active lymphoma.  The mass at the tongue base has resolved. He completed consolidation radiation in November, 2021.  PET imaging from September of 2022 remains stable with no evidence of residual or recurrent disease. There was a stable single 9 mm, weakly hypermetabolic left level 1B lymph node, likely inflammatory.  The latest PET scan from July 15, 2021 shows no evidence of lymphoma recurrence. CT scans from March 2025 show no evidence of recurrent lymphoma. He remains in complete remission.    2. Mildly impaired left ventricular systolic function with an EF between 45-50%. He continues to follow up with Dr. Bing Ball, his cardiologist.    3. Anemia, which is mild.  Iron studies, B12 and folate were all normal, but his B-12 was low normal for someone on regular injections. His SPEP was negative for a monoclonal spike. His hemoglobin now remains stable at 13.6.   4. Large hiatal hernia. I told him he could use Mylanta as needed.  5. Right inguinal hernia, probably bilateral.   Plan: He had a CT neck done on 07/19/2023 that revealed no evidence of recurrent or residual disease. CT chest, abdomen, and pelvis done on the same day also revealed no lymphadenopathy in the chest, abdomen, or pelvis and no evidence of disease recurrence. Labs done on 07/19/2023 showed a WBC of 8.0, hemoglobin of 13.6, and platelet count of 206,000. His CMP was normal and LDH was normal at 165. He will have his port flushed today and I informed him that he can have his port removed whenever he wants. I will see him  back in 6 months with CBC, CMP, LDH, and port flush. We will plan repeat scans in 1 year if all is well.  He agrees with this plan and his questions were answered. He was instructed to call sooner if concerns arise regarding his lymphoma.  I provided 25 minutes of face-to-face time during this this encounter and > 50% was spent counseling as documented under my assessment and plan.   Ryan Pray MD [facility] [address] [place] [address] Dept: [phone] Dept Fax: [phone]   CHIEF COMPLAINT:  CC: History of stage IIA large B-cell lymphoma  Current Treatment:  Surveillance  HISTORY OF PRESENT ILLNESS:  Ryan Ball is a 89 y.o. male with stage IIA diffuse large B-cell lymphoma diagnosed in June 2021.  He presented with voice changes and dysphagia. CT imaging revealed 2.4 x 3.9 x 4.8 cm posterior tongue base mass, as well as mild bilateral cervical lymphadenopathy.  He saw Dr. Lendell Ball and underwent biopsy, which revealed diffuse large B-cell lymphoma, non-germinal center phenotype, which was CD45, CD19, CD20 and CD38 positive.  PET-CT revealed hypermetabolic tongue base mass measuring 4.8 x 2.1 cm with an SUV of 15.5 with bilateral cervical lymph nodes measuring 1.5 cm with an SUV max of 11.3.  He was seen by Dr. Alphonsa Ball at [facility].  She recommended starting with R-mini-CHOP given every 3 weeks, increasing the doses of chemotherapy with each  cycle as tolerated.  Prior to chemotherapy he had an echocardiogram, which revealed mild concentric left ventricular hypertrophy, with mild global hypokinesis of the left ventricle and overall mildly impaired left ventricular systolic function with an ejection fraction of 45-50%.  He had his 1st cycle of R-mini-CHOP, with the chemotherapy given at 50% dose reduction, on July 12th. He received his second cycle at 75% dose and a third  cycle at 90%. PET imaging from September 2021 revealed no evidence of metabolically active lymphoma within the neck, chest, abdomen or pelvis.  The previously demonstrated mass at the base of the tongue has resolved.  He was referred for involved field radiation for consolidation.  PET imaging from February 2022 revealed a stable exam with no evidence for metabolically active lymphoma in the neck, chest, abdomen, or pelvis.  He had a skin cancer removed at Dr. Mayford Ball office, and underwent Mohs surgery of the right lower leg.  I have reviewed his chart and materials related to his cancer extensively and collaborated history with the patient. Summary of oncologic history is as follows: Oncology History  Diffuse large B-cell lymphoma (HCC)  10/19/2019 Cancer Staging   Staging form: Hodgkin and Non-Hodgkin Lymphoma, AJCC 8th Edition - Clinical stage from 10/19/2019: Stage II (Diffuse large B-cell lymphoma) - Signed by Ryan Beckwith, MD on 04/13/2020 Prognostic indicators: 4.8 cm mass of posterior tongue base, CR on PET after 3 cycles R-CHOP   10/30/2019 Initial Diagnosis   Diffuse large B-cell lymphoma (HCC)    INTERVAL HISTORY:  Ryan Ball is here for routine follow up for his history of stage IIA large B-cell lymphoma diagnosed in June, 2021. Patient states that he feels well abut complains of mild imbalance issues when walking. He informed me that he had acid reflux and indigestion since drinking the contrast on a empty stomach for his CT scan. He took Mylanta a couple of times and this is now resolved. He had a CT neck done on 07/19/2023 that revealed no evidence of recurrent or residual disease. CT chest, abdomen, and pelvis done on the same day also revealed no lymphadenopathy in the chest, abdomen, or pelvis and no evidence of disease recurrence. Labs done on 07/19/2023 showed a WBC of 8.0, hemoglobin of 13.6, and platelet count of 206,000. His CMP was normal and LDH was normal at 165. He will  have his port flushed today and I informed him that he can have his port removed when he wants. I will see him back in 6 months with CBC, CMP, LDH, and port flush.   He denies signs of infection such as sore throat, sinus drainage, or urinary symptoms.  He denies fevers or recurrent chills. He denies pain. He denies nausea, vomiting, chest pain, dyspnea or cough. His appetite is good and his weight has increased 2 pounds over last 5 months .  HISTORY:  Allergies: No Known Allergies Past Medical History:  Diagnosis Date   Anemia 11/16/2022   Anemia due to antineoplastic chemotherapy 04/13/2020   Atypical chest pain 10/10/2018   Cardiomegaly 06/08/2016   Diffuse large B-cell lymphoma (HCC) 10/30/2019   Dilated cardiomyopathy (HCC) ejection fraction 45% in summer 2021 by echo 05/20/2020   Encounter for venous access device care 04/09/2020   Esophageal reflux 10/10/2018   Hypercholesteremia 10/10/2018   Postoperative examination 12/12/2019   Senile nuclear sclerosis 05/19/2012   Senile nuclear sclerosis, bilateral 11/02/2019   Sleep disturbance 10/10/2018   SVT (supraventricular tachycardia) (HCC) 09/20/2019   Past Surgical History:  Procedure Laterality Date   CHOLECYSTECTOMY     Family History  Problem Relation Age of Onset   CAD Father    CAD Brother    Current Medications: Current Outpatient Medications  Medication Sig Dispense Refill   doxepin (SINEQUAN) 25 MG capsule Take 1 capsule by mouth at bedtime as needed (Sleeping).     isosorbide mononitrate (IMDUR) 30 MG 24 hr tablet Take 1 tablet (30 mg total) by mouth daily. 90 tablet 3   pantoprazole (PROTONIX) 40 MG tablet Take 40 mg by mouth daily.     REPLESTA 1.25 MG (50000 UT) WAFR Inject 5,000 Units into the skin every 14 (fourteen) days.     rosuvastatin (CRESTOR) 5 MG tablet Take 1 tablet by mouth every other day.      sucralfate (CARAFATE) 1 GM/10ML suspension Take 10 mLs by mouth 4 (four) times daily -  with meals and at  bedtime.     No current facility-administered medications for this visit.   REVIEW OF SYSTEMS:  Review of Systems  Constitutional:  Positive for fatigue. Negative for appetite change, chills, diaphoresis, fever and unexpected weight change.  HENT:   Positive for hearing loss. Negative for lump/mass, mouth sores, nosebleeds, sore throat, tinnitus, trouble swallowing and voice change.   Eyes: Negative.  Negative for eye problems and icterus.  Respiratory: Negative.  Negative for chest tightness, cough, hemoptysis, shortness of breath and wheezing.   Cardiovascular: Negative.  Negative for chest pain, leg swelling and palpitations.  Gastrointestinal: Negative.  Negative for abdominal distention, abdominal pain, blood in stool, constipation, diarrhea, nausea, rectal pain and vomiting.  Endocrine: Negative.  Negative for hot flashes.  Genitourinary: Negative.  Negative for bladder incontinence, difficulty urinating, dyspareunia, dysuria, frequency, hematuria, nocturia, pelvic pain and penile discharge.   Musculoskeletal:  Positive for gait problem (occasional imbalance). Negative for arthralgias, back pain, flank pain, myalgias, neck pain and neck stiffness.  Skin: Negative.  Negative for itching, rash and wound.  Neurological:  Positive for gait problem (occasional imbalance). Negative for dizziness, extremity weakness, headaches, light-headedness, numbness, seizures and speech difficulty.  Hematological: Negative.  Negative for adenopathy. Does not bruise/bleed easily.  Psychiatric/Behavioral: Negative.  Negative for confusion, decreased concentration, depression, sleep disturbance and suicidal ideas. The patient is not nervous/anxious.   All other systems reviewed and are negative.    VITALS:  Blood pressure (!) 141/79, pulse 82, temperature 97.7 F (36.5 C), temperature source Oral, resp. rate 20, height 5\' 9"  (1.753 m), weight 171 lb 11.2 oz (77.9 kg), SpO2 100%.  Wt Readings from Last 3  Encounters:  08/03/23 173 lb 12.8 oz (78.8 kg)  07/26/23 171 lb 11.2 oz (77.9 kg)  02/25/23 169 lb 4.8 oz (76.8 kg)    Body mass index is 25.36 kg/m.  Performance status (ECOG): 0 - Asymptomatic  PHYSICAL EXAM:  Physical Exam Vitals and nursing note reviewed. Exam conducted with a chaperone present.  Constitutional:      General: He is not in acute distress.    Appearance: Normal appearance. He is normal weight. He is not ill-appearing, toxic-appearing or diaphoretic.  HENT:     Head: Normocephalic and atraumatic.     Right Ear: Tympanic membrane, ear canal and external ear normal. There is no impacted cerumen.     Left Ear: Tympanic membrane, ear canal and external ear normal. There is no impacted cerumen.     Ears:     Comments: Hard of hearing    Nose: Nose normal. No congestion or  rhinorrhea.     Mouth/Throat:     Mouth: Mucous membranes are moist.     Pharynx: No oropharyngeal exudate or posterior oropharyngeal erythema.  Eyes:     General: No scleral icterus.       Right eye: No discharge.        Left eye: No discharge.     Extraocular Movements: Extraocular movements intact.     Conjunctiva/sclera: Conjunctivae normal.     Pupils: Pupils are equal, round, and reactive to light.  Neck:     Vascular: No carotid bruit.  Cardiovascular:     Rate and Rhythm: Normal rate and regular rhythm.     Pulses: Normal pulses.     Heart sounds: Murmur heard.     Systolic murmur is present with a grade of 2/6.     No friction rub. No gallop.  Pulmonary:     Effort: Pulmonary effort is normal. No respiratory distress.     Breath sounds: Normal breath sounds. No stridor. No wheezing, rhonchi or rales.  Chest:     Chest wall: No tenderness.  Abdominal:     General: Bowel sounds are normal. There is no distension.     Palpations: Abdomen is soft. There is no mass.     Tenderness: There is no abdominal tenderness. There is no right CVA tenderness, left CVA tenderness, guarding or  rebound.     Hernia: A hernia is present. Hernia is present in the right inguinal area.  Musculoskeletal:        General: No swelling, tenderness, deformity or signs of injury. Normal range of motion.     Cervical back: Normal range of motion and neck supple. No rigidity or tenderness.     Right lower leg: No edema.     Left lower leg: No edema.  Lymphadenopathy:     Cervical: No cervical adenopathy.  Skin:    General: Skin is warm and dry.     Coloration: Skin is not jaundiced or pale.     Findings: No bruising, erythema, lesion or rash.  Neurological:     General: No focal deficit present.     Mental Status: He is alert and oriented to person, place, and time. Mental status is at baseline.     Cranial Nerves: No cranial nerve deficit.     Sensory: No sensory deficit.     Motor: No weakness.     Coordination: Coordination normal.     Gait: Gait normal.     Deep Tendon Reflexes: Reflexes normal.  Psychiatric:        Mood and Affect: Mood normal.        Behavior: Behavior normal.        Thought Content: Thought content normal.        Judgment: Judgment normal.     LABS:      Latest Ref Rng & Units 08/03/2023    4:25 PM 07/19/2023   12:00 AM 02/25/2023    4:07 PM  CBC  WBC 3.4 - 10.8 x10E3/uL 7.5  8.0     8.2   Hemoglobin 13.0 - 17.7 g/dL 65.7  84.6     96.2   Hematocrit 37.5 - 51.0 % 39.4  41     42.8   Platelets 150 - 450 x10E3/uL 213  206     239      This result is from an external source.      Latest Ref Rng & Units 07/19/2023   12:00  AM 02/25/2023    4:07 PM 12/09/2022    1:47 PM  CMP  Glucose 70 - 99 mg/dL  85  161   BUN 4 - 21 17     21  14    Creatinine 0.6 - 1.3 1.0     1.06  1.10   Sodium 137 - 147 137     136  140   Potassium 3.5 - 5.1 mEq/L 4.5     4.2  3.8   Chloride 99 - 108 101     102  104   CO2 13 - 22 28     27  27    Calcium 8.7 - 10.7 9.1     9.3  8.8   Total Protein 6.5 - 8.1 g/dL  7.2  6.3   Total Bilirubin 0.3 - 1.2 mg/dL  0.4  0.7    Alkaline Phos 25 - 125 61     61  53   AST 14 - 40 30     22  22    ALT 10 - 40 U/L 15     16  18       This result is from an external source.   Lab Results  Component Value Date   TIBC 309 11/16/2022   TIBC 282 05/13/2022   TIBC 312 12/24/2021   FERRITIN 32 02/25/2023   FERRITIN 34 11/16/2022   FERRITIN 68.4 05/13/2022   IRONPCTSAT 30 11/16/2022   IRONPCTSAT 39 05/13/2022   IRONPCTSAT 27 12/24/2021   Lab Results  Component Value Date   LDH 115 12/09/2022   LDH 109 11/15/2022   LDH 102 12/23/2021   STUDIES:      I,Jasmine M Lassiter,acting as a scribe for Ryan Beckwith, MD.,have documented all relevant documentation on the behalf of Ryan Beckwith, MD,as directed by  Ryan Beckwith, MD while in the presence of Ryan Beckwith, MD.

## 2023-07-25 ENCOUNTER — Encounter: Payer: Self-pay | Admitting: Oncology

## 2023-07-25 LAB — LACTATE DEHYDROGENASE: LDH: 165

## 2023-07-26 ENCOUNTER — Inpatient Hospital Stay

## 2023-07-26 ENCOUNTER — Inpatient Hospital Stay: Payer: Medicare Other | Attending: Oncology | Admitting: Oncology

## 2023-07-26 ENCOUNTER — Encounter: Payer: Self-pay | Admitting: Oncology

## 2023-07-26 ENCOUNTER — Telehealth: Payer: Self-pay | Admitting: Oncology

## 2023-07-26 ENCOUNTER — Other Ambulatory Visit: Payer: Self-pay | Admitting: Oncology

## 2023-07-26 VITALS — BP 141/79 | HR 82 | Temp 97.7°F | Resp 20 | Ht 69.0 in | Wt 171.7 lb

## 2023-07-26 DIAGNOSIS — C8331 Diffuse large B-cell lymphoma, lymph nodes of head, face, and neck: Secondary | ICD-10-CM

## 2023-07-26 DIAGNOSIS — D6481 Anemia due to antineoplastic chemotherapy: Secondary | ICD-10-CM | POA: Diagnosis not present

## 2023-07-26 DIAGNOSIS — Z9221 Personal history of antineoplastic chemotherapy: Secondary | ICD-10-CM | POA: Insufficient documentation

## 2023-07-26 DIAGNOSIS — Z452 Encounter for adjustment and management of vascular access device: Secondary | ICD-10-CM

## 2023-07-26 DIAGNOSIS — Z923 Personal history of irradiation: Secondary | ICD-10-CM | POA: Diagnosis not present

## 2023-07-26 DIAGNOSIS — T451X5A Adverse effect of antineoplastic and immunosuppressive drugs, initial encounter: Secondary | ICD-10-CM

## 2023-07-26 DIAGNOSIS — Z8572 Personal history of non-Hodgkin lymphomas: Secondary | ICD-10-CM | POA: Diagnosis present

## 2023-07-26 MED ORDER — HEPARIN SOD (PORK) LOCK FLUSH 100 UNIT/ML IV SOLN
500.0000 [IU] | Freq: Once | INTRAVENOUS | Status: AC | PRN
  Administered 2023-07-26: 500 [IU]

## 2023-07-26 MED ORDER — SODIUM CHLORIDE 0.9% FLUSH
10.0000 mL | INTRAVENOUS | Status: DC | PRN
Start: 2023-07-26 — End: 2023-07-26
  Administered 2023-07-26: 10 mL

## 2023-07-26 NOTE — Patient Instructions (Signed)
 Central Line in Adults: What to Expect A central line is a soft tube that is put into a vein so that it goes to a large vein above your heart. It can be used to: Give you medicine or fluids. Give you nutrients. Take blood or give you blood for testing or treatments. Give chemotherapy or dialysis. Provide IV access if veins in your hands or arms are difficult to use. Types of central lines There are four main types of central lines: Peripherally inserted central catheter (PICC) line. This type is usually put in the upper arm and goes up the arm to the vein above your heart. This is used for one week or longer. Tunneled central line. This type is placed in a large vein in the neck, chest, or groin. It is tunneled under the skin and brought out through a small cut in your skin. Non-tunneled central line. This type is used for a shorter time than other types, usually for 7 days at the most. It is put in the neck, chest, or groin. Implanted port. This type can stay in place longer than other types of central lines. It is normally put in the upper chest but can also be placed in the upper arm or the belly. Surgery is needed to put it in and take it out. The type of central line you get will depend on how long you need it and your medical condition. Tell a doctor about: Any allergies you have. All medicines you take. These include vitamins, herbs, eye drops, and creams. Any problems you or family members have had with anesthesia. Any bleeding problems you have. Any surgeries you have had. Any medical conditions you have. Whether you're pregnant or may be pregnant. What are the risks? Your doctor will talk with you about risks. These may include: Infection. A blood clot. Bleeding. Getting a hole or crack in the central line. If this happens, the central line will need to be replaced. The catheter moving or coming out of place. A collapsed lung. Damage to other structures or  organs. What happens before the procedure? Medicines Ask about changing or stopping: Any medicines you take. Any vitamins, herbs, or supplements you take. Do not take aspirin or ibuprofen unless you're told to. Surgery safety For your safety, you may: Need to wash your skin with a soap that kills germs. Get antibiotics. Have your procedure site marked. Have hair removed at the procedure site. General instructions Eat and drink as told. Plan to have a responsible adult take you home from the hospital or clinic. Ask if you'll be staying overnight in the hospital. If you'll be going home right after the procedure, plan to have a responsible adult: Drive you home from the hospital or clinic. You won't be allowed to drive. Stay with you for the time you're told. What happens during the procedure? An IV will be put into one of your veins. You may be given: A sedative to help you relax. Anesthesia to keep you from feeling pain. Your skin will be cleaned with a soap that kills germs, and you may be covered with clean sheet called a drape. Your blood pressure, heart rate, breathing rate, and blood oxygen level will be checked during the procedure. The central line will be put into the vein and moved through it to the correct spot. The doctor may use X-ray equipment to help guide the central line to the right place. A bandage will be placed over the insertion area.  These steps may vary. Ask what you can expect. What can I expect after the procedure? You will watched closely until you leave. This includes checking your pain level, blood pressure, heart rate, and breathing rate. Caps may be put on the ends of the central line tubes. If you were given a sedative, do not drive or use machines until you're told it's safe. A sedative can make you sleepy. Follow these instructions at home: Caring for the tube  Follow instructions from your doctor about: Flushing the tube. Cleaning the tube and  the area around it. Only use germ-free, also called sterile, supplies to flush the central line. The supplies should be from your doctor, a pharmacy, or another place that your doctor recommends. Before you flush the tube or clean the area around it: Wash your hands with soap and water for at least 20 seconds. If you can't use soap and water, use hand sanitizer. Clean the central line hub with rubbing alcohol. To do this: Clean the central line hub with a new alcohol wipe. Scrub it using a twisting motion and rub for 10 to 15 seconds or for 30 twists. Follow the manufacturer's instructions. Be sure you scrub the top of the hub, not just the sides. Let the hub dry before use. Keep it from touching anything while drying. Do not re-use alcohol pads. Caring for your skin Check the skin around the central line every day for signs of infection. Check for: Redness, swelling, or pain. Fluid or blood. Warmth. Pus or a bad smell. Keep the area where the tube was put in clean and dry. Change bandages only as told by your doctor. Keep your bandage dry. If a bandage gets wet, have it changed right away. Storing and throwing away supplies Keep your supplies in a clean, dry location. Throw away any used syringes in a container that is only for sharp items, called a sharps container. You can buy a sharps container from a pharmacy, or you can make one by using an empty hard plastic bottle with a cover. Place any used bandages or infusion bags into a plastic bag. Throw that bag in the trash. General instructions Follow instructions from your doctor for the type of device that you have. Keep the tube clamped unless it's being used. If you or someone else accidentally pulls on the tube, make sure: The bandage is OK. There is no bleeding. The tube has not been pulled out. Do not use scissors or sharp objects near the tube. Do not take baths, swim, or use a hot tub until you're told it's OK. Ask if you can  shower. Ask your doctor what activities are safe for you. Your doctor may tell you not to lift anything or move your arm too much on the side of your central line. Contact a doctor if: You have signs of an infection where the tube was put in. The skin is irritated near the bandage. You catheter appears to be getting longer. This may mean it is coming out of the vein. Get help right away if: You have: A fever or chills. Shortness of breath. Pain in your chest. A fast heartbeat. Swelling in your neck, face, chest, or arm. You feel dizzy or you faint. There are red lines coming from where the tube was put in. The area where the tube was put in is bleeding and the bleeding won't stop. Your tube is hard to flush. You do not get a blood return from the  tube. The tube gets loose or comes out. The tube has a hole or a tear. The tube leaks. This information is not intended to replace advice given to you by your health care provider. Make sure you discuss any questions you have with your health care provider. Document Revised: 11/22/2022 Document Reviewed: 11/22/2022 Elsevier Patient Education  2024 [employer].

## 2023-07-26 NOTE — Telephone Encounter (Signed)
 07/26/23 Spoke with patient and confirmed next appt.

## 2023-08-03 ENCOUNTER — Ambulatory Visit: Attending: Cardiology | Admitting: Cardiology

## 2023-08-03 ENCOUNTER — Encounter: Payer: Self-pay | Admitting: Cardiology

## 2023-08-03 ENCOUNTER — Telehealth: Payer: Self-pay

## 2023-08-03 VITALS — BP 124/70 | HR 84 | Ht 69.0 in | Wt 173.8 lb

## 2023-08-03 DIAGNOSIS — I471 Supraventricular tachycardia, unspecified: Secondary | ICD-10-CM | POA: Diagnosis not present

## 2023-08-03 DIAGNOSIS — K219 Gastro-esophageal reflux disease without esophagitis: Secondary | ICD-10-CM | POA: Insufficient documentation

## 2023-08-03 DIAGNOSIS — I25118 Atherosclerotic heart disease of native coronary artery with other forms of angina pectoris: Secondary | ICD-10-CM | POA: Diagnosis present

## 2023-08-03 DIAGNOSIS — C8331 Diffuse large B-cell lymphoma, lymph nodes of head, face, and neck: Secondary | ICD-10-CM | POA: Insufficient documentation

## 2023-08-03 DIAGNOSIS — K2100 Gastro-esophageal reflux disease with esophagitis, without bleeding: Secondary | ICD-10-CM | POA: Insufficient documentation

## 2023-08-03 DIAGNOSIS — I350 Nonrheumatic aortic (valve) stenosis: Secondary | ICD-10-CM | POA: Diagnosis present

## 2023-08-03 MED ORDER — ISOSORBIDE MONONITRATE ER 30 MG PO TB24: 30.0000 mg | ORAL_TABLET | Freq: Every day | ORAL | 3 refills | Status: DC

## 2023-08-03 NOTE — Progress Notes (Unsigned)
 Cardiology Office Note:    Date:  08/03/2023   ID:  Ryan Ball, DOB [birth year], MRN [id number]  PCP:  Annamaria Helling, DO  Cardiologist:  Gypsy Balsam, MD    Referring MD: Annamaria Helling, DO   Chief Complaint  Patient presents with   Gastroesophageal Reflux   Dizziness   unstable balance    History of Present Illness:    Ryan Ball is a 89 y.o. male past medical history significant for cardiomyopathy ejection fraction 45%, hemodynamically insignificant aortic stenosis last echocardiogram September 2023 showed normalization of left ventricle ejection fraction, he also had a stress test done which showed minimal area of ischemia but he was completely asymptomatic additional problem include diffuse large B-cell lymphoma, status postchemotherapy he requested to be seen today because about 2 weeks ago he supposed to have a CT of his abdomen with contrast that he drank contrast on empty stomach and then started having some heartburn at evening time when he is laying down in the bed he does have the sensation almost every single day, he takes some Maalox which helps.  Takes some Pepto-Bismol which helps.  He did see some nurse practitioner or PA who gave him some medication for stomach that seems to be helping but still have some problems interestingly he tried to walk like usually he said that when he walks the symptoms gets better.  Look very atypical more like GI issue.  Past Medical History:  Diagnosis Date   Anemia 11/16/2022   Anemia due to antineoplastic chemotherapy 04/13/2020   Atypical chest pain 10/10/2018   Cardiomegaly 06/08/2016   Diffuse large B-cell lymphoma (HCC) 10/30/2019   Dilated cardiomyopathy (HCC) ejection fraction 45% in summer 2021 by echo 05/20/2020   Encounter for venous access device care 04/09/2020   Esophageal reflux 10/10/2018   Hypercholesteremia 10/10/2018   Postoperative examination 12/12/2019   Senile nuclear sclerosis  05/19/2012   Senile nuclear sclerosis, bilateral 11/02/2019   Sleep disturbance 10/10/2018   SVT (supraventricular tachycardia) (HCC) 09/20/2019    Past Surgical History:  Procedure Laterality Date   CHOLECYSTECTOMY      Current Medications: Current Meds  Medication Sig   doxepin (SINEQUAN) 25 MG capsule Take 1 capsule by mouth at bedtime as needed (Sleeping).   isosorbide mononitrate (IMDUR) 30 MG 24 hr tablet Take 1 tablet (30 mg total) by mouth daily.   pantoprazole (PROTONIX) 40 MG tablet Take 40 mg by mouth daily.   REPLESTA 1.25 MG (50000 UT) WAFR Inject 5,000 Units into the skin every 14 (fourteen) days.   rosuvastatin (CRESTOR) 5 MG tablet Take 1 tablet by mouth every other day.    sucralfate (CARAFATE) 1 GM/10ML suspension Take 10 mLs by mouth 4 (four) times daily -  with meals and at bedtime.   [DISCONTINUED] SUCRALFATE PO Take by mouth.     Allergies:   Patient has no known allergies.   Social History   Socioeconomic History   Marital status: Widowed    Spouse name: Not on file   Number of children: Not on file   Years of education: Not on file   Highest education level: Not on file  Occupational History   Not on file  Tobacco Use   Smoking status: Never   Smokeless tobacco: Never  Substance and Sexual Activity   Alcohol use: Yes    Alcohol/week: 6.0 standard drinks of alcohol    Types: 6 Glasses of wine per week    Comment: 1-2 glasses  of wine 2-3 times a week   Drug use: Never   Sexual activity: Not on file  Other Topics Concern   Not on file  Social History Narrative   Not on file   Social Drivers of Health   Financial Resource Strain: Not on file  Food Insecurity: Not on file  Transportation Needs: Not on file  Physical Activity: Not on file  Stress: Not on file  Social Connections: Not on file     Family History: The patient's family history includes CAD in his brother and father. ROS:   Please see the history of present illness.    All  14 point review of systems negative except as described per history of present illness  EKGs/Labs/Other Studies Reviewed:    EKG Interpretation Date/Time:  Wednesday August 03 2023 15:28:42 EDT Ventricular Rate:  84 PR Interval:  182 QRS Duration:  80 QT Interval:  358 QTC Calculation: 423 R Axis:   35  Text Interpretation: Normal sinus rhythm with sinus arrhythmia Normal ECG When compared with ECG of 28-Jan-2021 09:15, Sinus rhythm has replaced Atrial fibrillation Confirmed by Gypsy Balsam [phone]) on 08/03/2023 4:06:03 PM    Recent Labs: 07/19/2023: ALT 15; BUN 17; Creatinine 1.0; Hemoglobin 13.6; Platelets 206; Potassium 4.5; Sodium 137  Recent Lipid Panel No results found for: "CHOL", "TRIG", "HDL", "CHOLHDL", "VLDL", "LDLCALC", "LDLDIRECT"  Physical Exam:    VS:  BP 124/70 (Patient Position: Sitting)   Pulse 84   Ht 5\' 9"  (1.753 m)   Wt 173 lb 12.8 oz (78.8 kg)   SpO2 95%   BMI 25.67 kg/m     Wt Readings from Last 3 Encounters:  08/03/23 173 lb 12.8 oz (78.8 kg)  07/26/23 171 lb 11.2 oz (77.9 kg)  02/25/23 169 lb 4.8 oz (76.8 kg)     GEN:  Well nourished, well developed in no acute distress HEENT: Normal NECK: No JVD; No carotid bruits LYMPHATICS: No lymphadenopathy CARDIAC: RRR, no murmurs, no rubs, no gallops RESPIRATORY:  Clear to auscultation without rales, wheezing or rhonchi  ABDOMEN: Soft, non-tender, non-distended MUSCULOSKELETAL:  No edema; No deformity  SKIN: Warm and dry LOWER EXTREMITIES: no swelling NEUROLOGIC:  Alert and oriented x 3 PSYCHIATRIC:  Normal affect   ASSESSMENT:    1. SVT (supraventricular tachycardia) (HCC)   2. Coronary artery disease of native artery of native heart with stable angina pectoris (HCC)   3. Nonrheumatic aortic valve stenosis   4. Diffuse large B-cell lymphoma of lymph nodes of neck (HCC)    PLAN:    In order of problems listed above:  Atypical chest pain.  Look GI like but knowing the fact that he got  abnormal stress test I propose to try some Imdur 30 mg daily to see if give him any relief.  In the meantime continue stomach medication, will refer him to Dr. Jennye Boroughs GI. Coronary disease plan as described above. Nonrheumatic aortic valve stenosis only mild. Diffuse B-cell lymphoma followed by oncology team   Medication Adjustments/Labs and Tests Ordered: Current medicines are reviewed at length with the patient today.  Concerns regarding medicines are outlined above.  Orders Placed This Encounter  Procedures   Troponin T   CBC   EKG 12-Lead   Medication changes:  Meds ordered this encounter  Medications   isosorbide mononitrate (IMDUR) 30 MG 24 hr tablet    Sig: Take 1 tablet (30 mg total) by mouth daily.    Dispense:  90 tablet    Refill:  3    Signed, Georgeanna Lea, MD, [facility] 08/03/2023 5:15 PM    [facility]

## 2023-08-03 NOTE — Telephone Encounter (Signed)
 Received the following message from Ryan Ball below:  "Ryan Ball needs to be referred to [facility]"  An ambulatory referral was placed for the patient to see Ryan Ball via Epic. Attempted to call the patient to inform him of the referral from Ryan Ball and the patient did not answer the phone. A message was left for the patient to call back.

## 2023-08-03 NOTE — Patient Instructions (Signed)
 Medication Instructions:  Your physician has recommended you make the following change in your medication:   START: Imdur 30 mg daily  *If you need a refill on your cardiac medications before your next appointment, please call your pharmacy*   Lab Work: Your physician recommends that you return for lab work in:   Labs today: Troponin T, CBC  If you have labs (blood work) drawn today and your tests are completely normal, you will receive your results only by: MyChart Message (if you have MyChart) OR A paper copy in the mail If you have any lab test that is abnormal or we need to change your treatment, we will call you to review the results.   Testing/Procedures: None   Follow-Up: At [facility], you and your health needs are our priority.  As part of our continuing mission to provide you with exceptional heart care, we have created designated Provider Care Teams.  These Care Teams include your primary Cardiologist (physician) and Advanced Practice Providers (APPs -  Physician Assistants and Nurse Practitioners) who all work together to provide you with the care you need, when you need it.  We recommend signing up for the patient portal called "MyChart".  Sign up information is provided on this After Visit Summary.  MyChart is used to connect with patients for Virtual Visits (Telemedicine).  Patients are able to view lab/test results, encounter notes, upcoming appointments, etc.  Non-urgent messages can be sent to your provider as well.   To learn more about what you can do with MyChart, go to [email or web address].    Your next appointment:   1 month(s)  Provider:   Gypsy Balsam, MD    Other Instructions None

## 2023-08-04 LAB — CBC
Hematocrit: 39.4 % (ref 37.5–51.0)
Hemoglobin: 12.7 g/dL — ABNORMAL LOW (ref 13.0–17.7)
MCH: 29.3 pg (ref 26.6–33.0)
MCHC: 32.2 g/dL (ref 31.5–35.7)
MCV: 91 fL (ref 79–97)
Platelets: 213 10*3/uL (ref 150–450)
RBC: 4.33 x10E6/uL (ref 4.14–5.80)
RDW: 12.1 % (ref 11.6–15.4)
WBC: 7.5 10*3/uL (ref 3.4–10.8)

## 2023-08-04 LAB — TROPONIN T: Troponin T (Highly Sensitive): 11 ng/L (ref 0–22)

## 2023-08-05 ENCOUNTER — Telehealth: Payer: Self-pay

## 2023-08-05 NOTE — Telephone Encounter (Signed)
-----   Message from Gypsy Balsam sent at 08/05/2023  9:03 AM EDT ----- Troponin I was negative, CBC normal

## 2023-08-05 NOTE — Telephone Encounter (Signed)
 Patient notified of results and verbalized understanding.

## 2023-08-08 ENCOUNTER — Telehealth: Payer: Self-pay | Admitting: Cardiology

## 2023-08-08 ENCOUNTER — Encounter: Payer: Self-pay | Admitting: Oncology

## 2023-08-08 NOTE — Telephone Encounter (Signed)
 Patient came by the office and had questions why Dr. Bing Matter had prescribed him the Imdur medication. I explained that it was because of his chest pain that he was having. Patient stated that he was not having chest pain he was only having GERD and then as he continued to talk about his GERD he stated that he only had a small amount of chest pain but it has gone away. I explained that was why Dr. Bing Matter had prescribed him the Imdur medication. He was also concerned that he had not received a call from his GI referral Dr. Camie Patience, for an appointment regarding his GERD. I called Dr. Adair Patter office and had them speak to the patient regarding setting up an appointment. Unfortunately they were unable to set - up an appointment until May or June. They also mentioned that the patient could follow up with his PCP regarding his GERD. Patient stated that he would follow up with his PCP since he could be seen by Dr. Camie Patience until May or June. Patient had no further questions at this time.

## 2023-08-08 NOTE — Telephone Encounter (Signed)
 Patient walked in with questions as to why he has been prescribed Isisirbide Mononit ER 30mg . Also would like to know about a referral that Dr. Kirtland Bouchard did for him last week. CB # [phone]

## 2023-08-24 ENCOUNTER — Other Ambulatory Visit: Payer: Self-pay | Admitting: Cardiology

## 2023-08-29 ENCOUNTER — Telehealth: Payer: Self-pay | Admitting: Cardiology

## 2023-08-29 NOTE — Telephone Encounter (Signed)
 Called patient and he stated that he was not having the acid reflux burning feeling in his chest that he was having before. He also stated that he took one tablet of the Imdur  medication and he felt a little light headed. Patient is asking If he can take Imdur  on an as needed basis?

## 2023-08-29 NOTE — Telephone Encounter (Signed)
 Patient coming in again today because he was prescribed Isosorbide  by Dr. Gordan Latina. Explained to patient per last phone note that it was for chest pain. Patient says that him and Dr. Gordan Latina believed the chest pain to be related to acid reflux so he wasn't sure why he would need to take this medication every day and that he hasn't started it. Patient has an appointment with GI on June 30th but would like a call back to know exactly what this is for and if he really needs to be taking it.  Please call [phone]  Thank you

## 2023-09-01 NOTE — Telephone Encounter (Signed)
 Called the patient and informed him of Dr. Tonja Fray recommendation below:  "The idea of taking Imdur  was to take it on the regular basis if he cannot tolerate it then I would simply stop it"  Patient stated that he would try to take the medication on a regular basis. I informed him that if he had any other symptoms such as being light headed to call us  back. Patient verbalized understanding and had no further questions at this time.

## 2023-09-14 ENCOUNTER — Encounter: Payer: Self-pay | Admitting: Cardiology

## 2023-09-14 ENCOUNTER — Ambulatory Visit: Attending: Cardiology | Admitting: Cardiology

## 2023-09-14 VITALS — BP 124/72 | HR 73 | Ht 69.0 in | Wt 167.4 lb

## 2023-09-14 DIAGNOSIS — I25118 Atherosclerotic heart disease of native coronary artery with other forms of angina pectoris: Secondary | ICD-10-CM | POA: Diagnosis present

## 2023-09-14 DIAGNOSIS — C8331 Diffuse large B-cell lymphoma, lymph nodes of head, face, and neck: Secondary | ICD-10-CM | POA: Diagnosis present

## 2023-09-14 DIAGNOSIS — I471 Supraventricular tachycardia, unspecified: Secondary | ICD-10-CM | POA: Diagnosis present

## 2023-09-14 NOTE — Patient Instructions (Signed)

## 2023-09-14 NOTE — Progress Notes (Unsigned)
 Cardiology Office Note:    Date:  09/14/2023   ID:  Ryan Ball, DOB 09/08/1930, MRN [id number]  PCP:  Russell Court, DO  Cardiologist:  Ralene Burger, MD    Referring MD: Russell Court, DO   Chief Complaint  Patient presents with   Medication Management    Imdur      History of Present Illness:    Ryan Ball is a 89 y.o. male past medical history significant for cardiomyopathy ejection fraction 45%, hemodynamically insignificant aortic stenosis, did have a stress test done showing minimal area of ischemia but he was completely asymptomatic, also diffuse large B-cell lymphoma status post chemotherapy.  He developed some heartburn.  He was given different medication lately Nexium which helped significantly.  Since he does have abnormal stress test I want him to take Imdur  he tried but became dizzy with this and could not tolerate it.  Comes today feeling better he is Nexium as that helps about 80% of time.  He is planning to go back to his routine exercises which I told him it is a good idea to do but I warn him and ask him to let me know if he develop any chest pain while walking so for chest pain always happen at rest.  Past Medical History:  Diagnosis Date   Anemia 11/16/2022   Anemia due to antineoplastic chemotherapy 04/13/2020   Atypical chest pain 10/10/2018   Cardiomegaly 06/08/2016   Diffuse large B-cell lymphoma (HCC) 10/30/2019   Dilated cardiomyopathy (HCC) ejection fraction 45% in summer 2021 by echo 05/20/2020   Encounter for venous access device care 04/09/2020   Esophageal reflux 10/10/2018   Hypercholesteremia 10/10/2018   Postoperative examination 12/12/2019   Senile nuclear sclerosis 05/19/2012   Senile nuclear sclerosis, bilateral 11/02/2019   Sleep disturbance 10/10/2018   SVT (supraventricular tachycardia) (HCC) 09/20/2019    Past Surgical History:  Procedure Laterality Date   CHOLECYSTECTOMY      Current  Medications: Current Meds  Medication Sig   doxepin (SINEQUAN) 25 MG capsule Take 1 capsule by mouth at bedtime as needed (Sleeping).   pantoprazole (PROTONIX) 40 MG tablet Take 40 mg by mouth daily.   REPLESTA 1.25 MG (50000 UT) WAFR Inject 5,000 Units into the skin every 14 (fourteen) days.   rosuvastatin (CRESTOR) 5 MG tablet Take 1 tablet by mouth every other day.    sucralfate (CARAFATE) 1 GM/10ML suspension Take 10 mLs by mouth 4 (four) times daily -  with meals and at bedtime.   [DISCONTINUED] isosorbide  mononitrate (IMDUR ) 30 MG 24 hr tablet Take 1 tablet (30 mg total) by mouth daily.     Allergies:   Patient has no known allergies.   Social History   Socioeconomic History   Marital status: Widowed    Spouse name: Not on file   Number of children: Not on file   Years of education: Not on file   Highest education level: Not on file  Occupational History   Not on file  Tobacco Use   Smoking status: Never   Smokeless tobacco: Never  Substance and Sexual Activity   Alcohol use: Yes    Alcohol/week: 6.0 standard drinks of alcohol    Types: 6 Glasses of wine per week    Comment: 1-2 glasses of wine 2-3 times a week   Drug use: Never   Sexual activity: Not on file  Other Topics Concern   Not on file  Social History Narrative   Not on  file   Social Drivers of Corporate investment banker Strain: Not on file  Food Insecurity: Not on file  Transportation Needs: Not on file  Physical Activity: Not on file  Stress: Not on file  Social Connections: Not on file     Family History: The patient's family history includes CAD in his brother and father. ROS:   Please see the history of present illness.    All 14 point review of systems negative except as described per history of present illness  EKGs/Labs/Other Studies Reviewed:         Recent Labs: 07/19/2023: ALT 15; BUN 17; Creatinine 1.0; Potassium 4.5; Sodium 137 08/03/2023: Hemoglobin 12.7; Platelets 213  Recent  Lipid Panel No results found for: "CHOL", "TRIG", "HDL", "CHOLHDL", "VLDL", "LDLCALC", "LDLDIRECT"  Physical Exam:    VS:  BP 124/72 (BP Location: Right Arm, Patient Position: Sitting)   Pulse 73   Ht 5\' 9"  (1.753 m)   Wt 167 lb 6.4 oz (75.9 kg)   SpO2 94%   BMI 24.72 kg/m     Wt Readings from Last 3 Encounters:  09/14/23 167 lb 6.4 oz (75.9 kg)  08/03/23 173 lb 12.8 oz (78.8 kg)  07/26/23 171 lb 11.2 oz (77.9 kg)     GEN:  Well nourished, well developed in no acute distress HEENT: Normal NECK: No JVD; No carotid bruits LYMPHATICS: No lymphadenopathy CARDIAC: RRR, no murmurs, no rubs, no gallops RESPIRATORY:  Clear to auscultation without rales, wheezing or rhonchi  ABDOMEN: Soft, non-tender, non-distended MUSCULOSKELETAL:  No edema; No deformity  SKIN: Warm and dry LOWER EXTREMITIES: no swelling NEUROLOGIC:  Alert and oriented x 3 PSYCHIATRIC:  Normal affect   ASSESSMENT:    1. SVT (supraventricular tachycardia) (HCC)   2. Coronary artery disease of native artery of native heart with stable angina pectoris (HCC)   3. Diffuse large B-cell lymphoma of lymph nodes of neck (HCC)    PLAN:    In order of problems listed above:  Coronary artery disease.  He does have mildly abnormal stress test symptoms are atypical but suspicious.  He cannot tolerate medications.  All his symptoms suggest GI symptomatology will make sure he follow-up with GI. Supraventricular tachycardia denies having any. Diffuse large B-cell lymphoma.  Stable. Dyslipidemia on Crestor 5 will make arrangements for fasting lipid profile to be rechecked   Medication Adjustments/Labs and Tests Ordered: Current medicines are reviewed at length with the patient today.  Concerns regarding medicines are outlined above.  No orders of the defined types were placed in this encounter.  Medication changes: No orders of the defined types were placed in this encounter.   Signed, Manfred Seed, MD,  [facility] 09/14/2023 10:59 AM    [facility]

## 2023-12-16 ENCOUNTER — Ambulatory Visit: Attending: Cardiology | Admitting: Cardiology

## 2023-12-16 VITALS — BP 118/76 | HR 88 | Ht 69.0 in | Wt 167.8 lb

## 2023-12-16 DIAGNOSIS — C8331 Diffuse large B-cell lymphoma, lymph nodes of head, face, and neck: Secondary | ICD-10-CM | POA: Diagnosis present

## 2023-12-16 DIAGNOSIS — I350 Nonrheumatic aortic (valve) stenosis: Secondary | ICD-10-CM | POA: Diagnosis present

## 2023-12-16 DIAGNOSIS — I420 Dilated cardiomyopathy: Secondary | ICD-10-CM | POA: Diagnosis present

## 2023-12-16 DIAGNOSIS — E7800 Pure hypercholesterolemia, unspecified: Secondary | ICD-10-CM | POA: Insufficient documentation

## 2023-12-16 DIAGNOSIS — I25118 Atherosclerotic heart disease of native coronary artery with other forms of angina pectoris: Secondary | ICD-10-CM | POA: Diagnosis present

## 2023-12-16 NOTE — Patient Instructions (Signed)
Medication Instructions:  Your physician recommends that you continue on your current medications as directed. Please refer to the Current Medication list given to you today.  *If you need a refill on your cardiac medications before your next appointment, please call your pharmacy*   Lab Work: None Ordered If you have labs (blood work) drawn today and your tests are completely normal, you will receive your results only by: MyChart Message (if you have MyChart) OR A paper copy in the mail If you have any lab test that is abnormal or we need to change your treatment, we will call you to review the results.   Testing/Procedures: None Ordered   Follow-Up: At [facility], you and your health needs are our priority.  As part of our continuing mission to provide you with exceptional heart care, we have created designated Provider Care Teams.  These Care Teams include your primary Cardiologist (physician) and Advanced Practice Providers (APPs -  Physician Assistants and Nurse Practitioners) who all work together to provide you with the care you need, when you need it.  We recommend signing up for the patient portal called "MyChart".  Sign up information is provided on this After Visit Summary.  MyChart is used to connect with patients for Virtual Visits (Telemedicine).  Patients are able to view lab/test results, encounter notes, upcoming appointments, etc.  Non-urgent messages can be sent to your provider as well.   To learn more about what you can do with MyChart, go to [email or web address].    Your next appointment:   5 month(s)  The format for your next appointment:   In Person  Provider:   Robert Krasowski, MD    Other Instructions NA  

## 2023-12-16 NOTE — Progress Notes (Signed)
 Cardiology Office Note:    Date:  12/16/2023   ID:  Rc Amison, DOB 1931-04-08, MRN [id number]  PCP:  Conley Dene BROCKS, DO  Cardiologist:  Lamar Fitch, MD    Referring MD: Conley Dene BROCKS, DO   No chief complaint on file.   History of Present Illness:    Ryan Ball is a 89 y.o. male  past medical history significant for cardiomyopathy ejection fraction 45%, hemodynamically insignificant aortic stenosis, did have a stress test done showing minimal area of ischemia but he was completely asymptomatic, also diffuse large B-cell lymphoma status post chemotherapy. He developed some heartburn. He was given different medication lately Nexium which helped significantly.  Comes today 2 months for follow-up.  Overall he says he is doing great.  He was on the beach 2 weeks with his family he enjoyed this tremendously he said he does not walk much right now because of hot weather but planning to return his to his activities.  Past Medical History:  Diagnosis Date   Anemia 11/16/2022   Anemia due to antineoplastic chemotherapy 04/13/2020   Atypical chest pain 10/10/2018   Cardiomegaly 06/08/2016   Diffuse large B-cell lymphoma (HCC) 10/30/2019   Dilated cardiomyopathy (HCC) ejection fraction 45% in summer 2021 by echo 05/20/2020   Encounter for venous access device care 04/09/2020   Esophageal reflux 10/10/2018   Hypercholesteremia 10/10/2018   Postoperative examination 12/12/2019   Senile nuclear sclerosis 05/19/2012   Senile nuclear sclerosis, bilateral 11/02/2019   Sleep disturbance 10/10/2018   SVT (supraventricular tachycardia) (HCC) 09/20/2019    Past Surgical History:  Procedure Laterality Date   CHOLECYSTECTOMY      Current Medications: Current Meds  Medication Sig   doxepin (SINEQUAN) 25 MG capsule Take 1 capsule by mouth at bedtime as needed (Sleeping).   esomeprazole (NEXIUM) 40 MG capsule daily at 6 (six) AM.   REPLESTA 1.25 MG (50000 UT)  WAFR Inject 5,000 Units into the skin every 14 (fourteen) days.   rosuvastatin (CRESTOR) 5 MG tablet Take 1 tablet by mouth every other day.      Allergies:   Patient has no known allergies.   Social History   Socioeconomic History   Marital status: Widowed    Spouse name: Not on file   Number of children: Not on file   Years of education: Not on file   Highest education level: Not on file  Occupational History   Not on file  Tobacco Use   Smoking status: Never   Smokeless tobacco: Never  Substance and Sexual Activity   Alcohol use: Yes    Alcohol/week: 6.0 standard drinks of alcohol    Types: 6 Glasses of wine per week    Comment: 1-2 glasses of wine 2-3 times a week   Drug use: Never   Sexual activity: Not on file  Other Topics Concern   Not on file  Social History Narrative   Not on file   Social Drivers of Health   Financial Resource Strain: Not on file  Food Insecurity: Not on file  Transportation Needs: Not on file  Physical Activity: Not on file  Stress: Not on file  Social Connections: Not on file     Family History: The patient's family history includes CAD in his brother and father. ROS:   Please see the history of present illness.    All 14 point review of systems negative except as described per history of present illness  EKGs/Labs/Other Studies Reviewed:  Recent Labs: 07/19/2023: ALT 15; BUN 17; Creatinine 1.0; Potassium 4.5; Sodium 137 08/03/2023: Hemoglobin 12.7; Platelets 213  Recent Lipid Panel No results found for: CHOL, TRIG, HDL, CHOLHDL, VLDL, LDLCALC, LDLDIRECT  Physical Exam:    VS:  BP 118/76 (BP Location: Right Arm)   Pulse 88   Ht 5' 9 (1.753 m)   Wt 167 lb 12.8 oz (76.1 kg)   SpO2 97%   BMI 24.78 kg/m     Wt Readings from Last 3 Encounters:  12/16/23 167 lb 12.8 oz (76.1 kg)  09/14/23 167 lb 6.4 oz (75.9 kg)  08/03/23 173 lb 12.8 oz (78.8 kg)     GEN:  Well nourished, well developed in no acute  distress HEENT: Normal NECK: No JVD; No carotid bruits LYMPHATICS: No lymphadenopathy CARDIAC: RRR, systolic murmur grade 1/6 to 2/6 pressure right upper portion of the sternum, no rubs, no gallops RESPIRATORY:  Clear to auscultation without rales, wheezing or rhonchi  ABDOMEN: Soft, non-tender, non-distended MUSCULOSKELETAL:  No edema; No deformity  SKIN: Warm and dry LOWER EXTREMITIES: no swelling NEUROLOGIC:  Alert and oriented x 3 PSYCHIATRIC:  Normal affect   ASSESSMENT:    1. Coronary artery disease of native artery of native heart with stable angina pectoris (HCC)   2. Dilated cardiomyopathy (HCC) ejection fraction 45% in summer 2021 by echo   3. Nonrheumatic aortic valve stenosis   4. 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 Hypercholesteremia    PLAN:    In order of problems listed above:  Coronary disease stable with mildly abnormal stress test but asymptomatic we will continue present management. Cardiomyopathy will schedule him later to have echocardiogram repeated. Nonrheumatic aortic valve stenosis noncritical echocardiogram will be reviewed and read done. Diffuse large B-cell lymphoma stable followed by oncology team. Hypercholesterolemia, I do have data from last year total cholesterol 161 HDL 60 we will repeat fasting lipid profile next time I see him   Medication Adjustments/Labs and Tests Ordered: Current medicines are reviewed at length with the patient today.  Concerns regarding medicines are outlined above.  No orders of the defined types were placed in this encounter.  Medication changes: No orders of the defined types were placed in this encounter.   Signed, Lamar DOROTHA Fitch, MD, [facility] 12/16/2023 11:29 AM    [facility]

## 2024-01-24 ENCOUNTER — Inpatient Hospital Stay

## 2024-01-24 ENCOUNTER — Inpatient Hospital Stay: Attending: Oncology | Admitting: Oncology

## 2024-01-24 ENCOUNTER — Telehealth: Payer: Self-pay | Admitting: Oncology

## 2024-01-24 ENCOUNTER — Other Ambulatory Visit: Payer: Self-pay | Admitting: Oncology

## 2024-01-24 VITALS — BP 146/82 | HR 90 | Temp 97.6°F | Resp 18 | Ht 69.0 in | Wt 172.2 lb

## 2024-01-24 DIAGNOSIS — H9190 Unspecified hearing loss, unspecified ear: Secondary | ICD-10-CM | POA: Diagnosis not present

## 2024-01-24 DIAGNOSIS — C8331 Diffuse large B-cell lymphoma, lymph nodes of head, face, and neck: Secondary | ICD-10-CM

## 2024-01-24 DIAGNOSIS — K4090 Unilateral inguinal hernia, without obstruction or gangrene, not specified as recurrent: Secondary | ICD-10-CM | POA: Insufficient documentation

## 2024-01-24 DIAGNOSIS — D649 Anemia, unspecified: Secondary | ICD-10-CM | POA: Diagnosis not present

## 2024-01-24 DIAGNOSIS — C8330 Diffuse large B-cell lymphoma, unspecified site: Secondary | ICD-10-CM | POA: Diagnosis present

## 2024-01-24 DIAGNOSIS — K449 Diaphragmatic hernia without obstruction or gangrene: Secondary | ICD-10-CM | POA: Insufficient documentation

## 2024-01-24 LAB — CBC WITH DIFFERENTIAL (CANCER CENTER ONLY)
Abs Immature Granulocytes: 0.02 K/uL (ref 0.00–0.07)
Basophils Absolute: 0 K/uL (ref 0.0–0.1)
Basophils Relative: 0 %
Eosinophils Absolute: 0.2 K/uL (ref 0.0–0.5)
Eosinophils Relative: 3 %
HCT: 38.3 % — ABNORMAL LOW (ref 39.0–52.0)
Hemoglobin: 12.5 g/dL — ABNORMAL LOW (ref 13.0–17.0)
Immature Granulocytes: 0 %
Lymphocytes Relative: 15 %
Lymphs Abs: 1.3 K/uL (ref 0.7–4.0)
MCH: 28.9 pg (ref 26.0–34.0)
MCHC: 32.6 g/dL (ref 30.0–36.0)
MCV: 88.5 fL (ref 80.0–100.0)
Monocytes Absolute: 0.8 K/uL (ref 0.1–1.0)
Monocytes Relative: 9 %
Neutro Abs: 6.2 K/uL (ref 1.7–7.7)
Neutrophils Relative %: 73 %
Platelet Count: 201 K/uL (ref 150–400)
RBC: 4.33 MIL/uL (ref 4.22–5.81)
RDW: 14.1 % (ref 11.5–15.5)
WBC Count: 8.5 K/uL (ref 4.0–10.5)
nRBC: 0 % (ref 0.0–0.2)

## 2024-01-24 LAB — CMP (CANCER CENTER ONLY)
ALT: 11 U/L (ref 0–44)
AST: 21 U/L (ref 15–41)
Albumin: 3.8 g/dL (ref 3.5–5.0)
Alkaline Phosphatase: 70 U/L (ref 38–126)
Anion gap: 11 (ref 5–15)
BUN: 15 mg/dL (ref 8–23)
CO2: 25 mmol/L (ref 22–32)
Calcium: 9.2 mg/dL (ref 8.9–10.3)
Chloride: 105 mmol/L (ref 98–111)
Creatinine: 1.02 mg/dL (ref 0.61–1.24)
GFR, Estimated: >60 mL/min (ref >60)
Glucose, Bld: 148 mg/dL — ABNORMAL HIGH (ref 70–99)
Potassium: 4.5 mmol/L (ref 3.5–5.1)
Sodium: 140 mmol/L (ref 135–145)
Total Bilirubin: 0.3 mg/dL (ref 0.0–1.2)
Total Protein: 6.3 g/dL — ABNORMAL LOW (ref 6.5–8.1)

## 2024-01-24 LAB — LACTATE DEHYDROGENASE: LDH: 151 U/L (ref 98–192)

## 2024-01-24 NOTE — Progress Notes (Signed)
 [facility]  [address] [place],  [place]  [address] [phone]  Clinic Day:  01/24/24  Referring physician: Conley Dene BROCKS, DO  ASSESSMENT & PLAN:  Assessment:  1. Stage II diffuse large B-cell lymphoma.  He tolerated treatment with R-mini-CHOP without significant difficulty, and completed 3 cycles at the end of August.  PET imaging revealed remission with no evidence of metabolically active lymphoma.  The mass at the tongue base has resolved. He completed consolidation radiation in November, 2021.  PET imaging from September of 2022 remains stable with no evidence of residual or recurrent disease. There was a stable single 9 mm, weakly hypermetabolic left level 1B lymph node, likely inflammatory.  The PET scan from July 15, 2021 shows no evidence of lymphoma recurrence. CT scans from March 2025 show no evidence of recurrent lymphoma. He remains in complete remission.    2. Mildly impaired left ventricular systolic function with an EF between 45-50%. He continues to follow up with cardiologist, Dr. Bernie.    3. Anemia, which is mild.  Iron studies, B12 and folate were all normal, but his B-12 was low normal for someone on regular injections. His SPEP was negative for a monoclonal spike. His hemoglobin now is slightly lower at 12.5. We will continue to monitor.   4. Large hiatal hernia. I told him he could use Mylanta as needed.  5. Right inguinal hernia, probably bilateral.   6. Severely hard of hearing.   Plan: Patient states that he feels that he is deconditioned due to inactivity and agrees he needs to get back to his usual exercise. He continues to see his cardiologist regularly. He informed me that he was seen by dermatology and had a couple of moles removed from his scalp. This subsequently became infected and he required antibiotics. The wound looks good now and he does have open granulation tissue. He has a WBC of 8.5, low hemoglobin of 12.5 down from 12.7,  and platelet count of 201,000. His CMP is normal other than a low total protein of 6.3. His LDH today is pending and I will see him back in 6 months with CBC, CMP, LDH, repeat CT neck and CT chest, abdomen, and pelvis. He agrees with this plan and his questions were answered. He was instructed to call sooner if concerns arise regarding his lymphoma.  I provided 17 minutes of face-to-face time during this this encounter and > 50% was spent counseling as documented under my assessment and plan.   Wanda Cornish MD [facility] [address] [place] [address] Dept: [phone] Dept Fax: [phone]   CHIEF COMPLAINT:  CC: History of stage IIA large B-cell lymphoma  Current Treatment:  Surveillance  HISTORY OF PRESENT ILLNESS:  Ryan Ball is a 89 y.o. male with stage IIA diffuse large B-cell lymphoma diagnosed in June 2021.  He presented with voice changes and dysphagia. CT imaging revealed 2.4 x 3.9 x 4.8 cm posterior tongue base mass, as well as mild bilateral cervical lymphadenopathy.  He saw Dr. Floretta and underwent biopsy, which revealed diffuse large B-cell lymphoma, non-germinal center phenotype, which was CD45, CD19, CD20 and CD38 positive.  PET-CT revealed hypermetabolic tongue base mass measuring 4.8 x 2.1 cm with an SUV of 15.5 with bilateral cervical lymph nodes measuring 1.5 cm with an SUV max of 11.3.  He was seen by Dr. Vaidya at [facility].  She recommended starting with R-mini-CHOP given every 3 weeks, increasing the doses of chemotherapy with each cycle as tolerated.  Prior to chemotherapy he had an echocardiogram, which revealed mild concentric left ventricular hypertrophy, with mild global hypokinesis of the left ventricle and overall mildly impaired left ventricular systolic function with an ejection fraction of 45-50%.  He had his 1st cycle of R-mini-CHOP, with the  chemotherapy given at 50% dose reduction, on July 12th. He received his second cycle at 75% dose and a third cycle at 90%. PET imaging from September 2021 revealed no evidence of metabolically active lymphoma within the neck, chest, abdomen or pelvis.  The previously demonstrated mass at the base of the tongue has resolved.  He was referred for involved field radiation for consolidation.  PET imaging from February 2022 revealed a stable exam with no evidence for metabolically active lymphoma in the neck, chest, abdomen, or pelvis.  He had a skin cancer removed at Dr. Trudy office, and underwent Mohs surgery of the right lower leg.  I have reviewed his chart and materials related to his cancer extensively and collaborated history with the patient. Summary of oncologic history is as follows: Oncology History  Diffuse large B-cell lymphoma (HCC)  10/19/2019 Cancer Staging   Staging form: Hodgkin and Non-Hodgkin Lymphoma, AJCC 8th Edition - Clinical stage from 10/19/2019: Stage II (Diffuse large B-cell lymphoma) - Signed by Cornelius Wanda DEL, MD on 04/13/2020 Prognostic indicators: 4.8 cm mass of posterior tongue base, CR on PET after 3 cycles R-CHOP   10/30/2019 Initial Diagnosis   Diffuse large B-cell lymphoma (HCC)    INTERVAL HISTORY:  Ryan Ball is here for routine follow up for his history of stage IIA large B-cell lymphoma diagnosed in June, 2021. Patient states that he feels well but complains of intermittent weakness and light headedness when standing. This occurs mainly in the morning and has for the last 2-3 weeks. He feels he is deconditioned due to inactivity and agrees he needs to get back to his usual exercise. He continues to see his cardiologist regularly. He informed me that he was seen by dermatology and had a couple of moles removed from his scalp. This subsequently became infected and he required antibiotics. The wound looks good now and he does have open granulation tissue. He has a  WBC of 8.5, low hemoglobin of 12.5 down from 12.7, and platelet count of 201,000. His CMP is normal other than a low total protein of 6.3. His LDH today is pending and I will see him back in 6 months with CBC, CMP, LDH, repeat CT neck and CT chest, abdomen, and pelvis. He denies fever, chills, night sweats, or other signs of infection. He denies cardiorespiratory and gastrointestinal issues. He  denies pain. His appetite is too good and His weight has increased 1 pounds over last 6 months.  HISTORY:  Allergies: No Known Allergies Past Medical History:  Diagnosis Date   Anemia 11/16/2022   Anemia due to antineoplastic chemotherapy 04/13/2020   Atypical chest pain 10/10/2018   Cardiomegaly 06/08/2016   Diffuse large B-cell lymphoma (HCC) 10/30/2019   Dilated cardiomyopathy (HCC) ejection fraction 45% in summer 2021 by echo 05/20/2020   Encounter for venous access device care 04/09/2020   Esophageal reflux 10/10/2018   Hypercholesteremia 10/10/2018   Postoperative examination 12/12/2019   Senile nuclear sclerosis 05/19/2012   Senile nuclear sclerosis, bilateral 11/02/2019   Sleep disturbance 10/10/2018   SVT (supraventricular tachycardia) (HCC) 09/20/2019   Past Surgical History:  Procedure Laterality Date   CHOLECYSTECTOMY     Family History  Problem Relation Age of Onset   CAD Father    CAD Brother    Current Medications: Current Outpatient Medications  Medication Sig Dispense Refill   doxepin (SINEQUAN) 25 MG capsule Take 1 capsule by mouth at bedtime as needed (Sleeping).     esomeprazole (NEXIUM) 40 MG capsule daily at 6 (six) AM.     pantoprazole (PROTONIX) 40 MG tablet Take 40 mg by mouth daily. (Patient not taking: Reported on 12/16/2023)     REPLESTA 1.25 MG (50000 UT) WAFR Inject 5,000 Units into the skin every 14 (fourteen) days.     rosuvastatin (CRESTOR) 5 MG tablet Take 1 tablet by mouth every other day.      sucralfate (CARAFATE) 1 GM/10ML suspension Take 10 mLs by  mouth 4 (four) times daily -  with meals and at bedtime. (Patient not taking: Reported on 12/16/2023)     No current facility-administered medications for this visit.   REVIEW OF SYSTEMS:  Review of Systems  Constitutional:  Positive for fatigue. Negative for appetite change, chills, diaphoresis, fever and unexpected weight change.  HENT:   Positive for hearing loss. Negative for lump/mass, mouth sores, nosebleeds, sore throat, tinnitus, trouble swallowing and voice change.   Eyes: Negative.  Negative for eye problems and icterus.  Respiratory: Negative.  Negative for chest tightness, cough, hemoptysis, shortness of breath and wheezing.   Cardiovascular: Negative.  Negative for chest pain, leg swelling and palpitations.  Gastrointestinal: Negative.  Negative for abdominal distention, abdominal pain, blood in stool, constipation, diarrhea, nausea, rectal pain and vomiting.  Endocrine: Negative.  Negative for hot flashes.  Genitourinary: Negative.  Negative for bladder incontinence, difficulty urinating, dyspareunia, dysuria, frequency, hematuria, nocturia, pelvic pain and penile discharge.   Musculoskeletal:  Positive for gait problem (occasional imbalance). Negative for arthralgias, back pain, flank pain, myalgias, neck pain and neck stiffness.  Skin: Negative.  Negative for itching, rash and wound.  Neurological:  Positive for extremity weakness, gait problem (occasional imbalance) and light-headedness (in the mornings). Negative for dizziness, headaches, numbness, seizures and speech difficulty.  Hematological: Negative.  Negative for adenopathy. Does not bruise/bleed easily.  Psychiatric/Behavioral: Negative.  Negative for confusion, decreased concentration, depression, sleep disturbance and suicidal ideas. The patient is not nervous/anxious.   All other systems reviewed and are negative.    VITALS:  Blood pressure (!) 146/82, pulse 90, temperature 97.6 F (36.4 C), temperature source Oral,  resp. rate 18, height 5' 9 (1.753 m), weight 172 lb 3.2 oz (78.1 kg), SpO2 100%.  Wt Readings from Last 3 Encounters:  01/24/24 172 lb 3.2 oz (78.1 kg)  12/16/23 167 lb 12.8 oz (76.1 kg)  09/14/23 167 lb 6.4 oz (75.9 kg)    Body mass index is 25.43 kg/m.  Performance status (ECOG): 1 - Symptomatic but completely ambulatory  PHYSICAL EXAM:  Physical Exam Vitals and nursing note reviewed. Exam conducted with a chaperone present.  Constitutional:      General: He is not in acute distress.    Appearance: Normal appearance. He is normal weight. He is not ill-appearing, toxic-appearing or diaphoretic.  HENT:     Head: Normocephalic and atraumatic.     Right Ear: Tympanic membrane, ear canal and external ear normal. There is no impacted cerumen.     Left Ear: Tympanic membrane, ear canal and external ear normal. There is no impacted cerumen.     Ears:     Comments: Hard  of hearing    Nose: Nose normal. No congestion or rhinorrhea.     Mouth/Throat:     Mouth: Mucous membranes are moist.     Pharynx: No oropharyngeal exudate or posterior oropharyngeal erythema.  Eyes:     General: No scleral icterus.       Right eye: No discharge.        Left eye: No discharge.     Extraocular Movements: Extraocular movements intact.     Conjunctiva/sclera: Conjunctivae normal.     Pupils: Pupils are equal, round, and reactive to light.  Neck:     Vascular: No carotid bruit.  Cardiovascular:     Rate and Rhythm: Normal rate and regular rhythm.     Pulses: Normal pulses.     Heart sounds: Murmur heard.     Systolic murmur is present with a grade of 2/6.     No friction rub. No gallop.  Pulmonary:     Effort: Pulmonary effort is normal. No respiratory distress.     Breath sounds: Normal breath sounds. No stridor. No wheezing, rhonchi or rales.  Chest:     Chest wall: No tenderness.  Abdominal:     General: Bowel sounds are normal. There is no distension.     Palpations: Abdomen is soft. There  is no mass.     Tenderness: There is no abdominal tenderness. There is no right CVA tenderness, left CVA tenderness, guarding or rebound.     Hernia: A hernia is present. Hernia is present in the right inguinal area.  Musculoskeletal:        General: No swelling, tenderness, deformity or signs of injury. Normal range of motion.     Cervical back: Normal range of motion and neck supple. No rigidity or tenderness.     Right lower leg: No edema.     Left lower leg: No edema.  Lymphadenopathy:     Cervical: No cervical adenopathy.  Skin:    General: Skin is warm and dry.     Coloration: Skin is not jaundiced or pale.     Findings: No bruising, erythema, lesion or rash.     Comments: Large open wound in the posterior scalp on the left side measuring about 3cm across, no sign of infection Raised scaly lesions measuring 4cm in length in the left anterior tibia Raised eschar in the right elbow area  Neurological:     General: No focal deficit present.     Mental Status: He is alert and oriented to person, place, and time. Mental status is at baseline.     Cranial Nerves: No cranial nerve deficit.     Sensory: No sensory deficit.     Motor: No weakness.     Coordination: Coordination normal.     Gait: Gait normal.     Deep Tendon Reflexes: Reflexes normal.  Psychiatric:        Mood and Affect: Mood normal.        Behavior: Behavior normal.        Thought Content: Thought content normal.        Judgment: Judgment normal.    LABS:      Latest Ref Rng & Units 01/24/2024    2:10 PM 08/03/2023    4:25 PM 07/19/2023   12:00 AM  CBC  WBC 4.0 - 10.5 K/uL 8.5  7.5  8.0      Hemoglobin 13.0 - 17.0 g/dL 87.4  87.2  86.3      Hematocrit  39.0 - 52.0 % 38.3  39.4  41      Platelets 150 - 400 K/uL 201  213  206         This result is from an external source.      Latest Ref Rng & Units 01/24/2024    2:10 PM 07/19/2023   12:00 AM 02/25/2023    4:07 PM  CMP  Glucose 70 - 99 mg/dL 851   85    BUN 8 - 23 mg/dL 15  17     21    Creatinine 0.61 - 1.24 mg/dL 8.97  1.0     8.93   Sodium 135 - 145 mmol/L 140  137     136   Potassium 3.5 - 5.1 mmol/L 4.5  4.5     4.2   Chloride 98 - 111 mmol/L 105  101     102   CO2 22 - 32 mmol/L 25  28     27    Calcium 8.9 - 10.3 mg/dL 9.2  9.1     9.3   Total Protein 6.5 - 8.1 g/dL 6.3   7.2   Total Bilirubin 0.0 - 1.2 mg/dL 0.3   0.4   Alkaline Phos 38 - 126 U/L 70  61     61   AST 15 - 41 U/L 21  30     22    ALT 0 - 44 U/L 11  15     16       This result is from an external source.   Lab Results  Component Value Date   TIBC 309 11/16/2022   TIBC 282 05/13/2022   TIBC 312 12/24/2021   FERRITIN 32 02/25/2023   FERRITIN 34 11/16/2022   FERRITIN 68.4 05/13/2022   IRONPCTSAT 30 11/16/2022   IRONPCTSAT 39 05/13/2022   IRONPCTSAT 27 12/24/2021   Lab Results  Component Value Date   LDH 151 01/24/2024   LDH 115 12/09/2022   LDH 109 11/15/2022   STUDIES:      I,Jasmine M Lassiter,acting as a scribe for Wanda VEAR Cornish, MD.,have documented all relevant documentation on the behalf of Wanda VEAR Cornish, MD,as directed by  Wanda VEAR Cornish, MD while in the presence of Wanda VEAR Cornish, MD.

## 2024-01-24 NOTE — Telephone Encounter (Signed)
 Patient has been scheduled for follow-up visit per 01/24/24 LOS.  Pt given an appt calendar with date and time.

## 2024-01-30 ENCOUNTER — Encounter: Payer: Self-pay | Admitting: Oncology

## 2024-02-03 ENCOUNTER — Ambulatory Visit (INDEPENDENT_AMBULATORY_CARE_PROVIDER_SITE_OTHER): Admitting: Family Medicine

## 2024-02-03 ENCOUNTER — Other Ambulatory Visit: Payer: Self-pay

## 2024-02-03 ENCOUNTER — Encounter (HOSPITAL_BASED_OUTPATIENT_CLINIC_OR_DEPARTMENT_OTHER): Payer: Self-pay | Admitting: Family Medicine

## 2024-02-03 VITALS — BP 172/79 | HR 83 | Temp 97.5°F | Resp 16 | Ht 64.96 in | Wt 171.7 lb

## 2024-02-03 DIAGNOSIS — R55 Syncope and collapse: Secondary | ICD-10-CM | POA: Diagnosis not present

## 2024-02-03 DIAGNOSIS — I350 Nonrheumatic aortic (valve) stenosis: Secondary | ICD-10-CM | POA: Diagnosis not present

## 2024-02-03 DIAGNOSIS — R42 Dizziness and giddiness: Secondary | ICD-10-CM | POA: Diagnosis not present

## 2024-02-03 DIAGNOSIS — C8330 Diffuse large B-cell lymphoma, unspecified site: Secondary | ICD-10-CM | POA: Diagnosis not present

## 2024-02-03 NOTE — Assessment & Plan Note (Addendum)
 Possibly due to fatigue, etc.  D/w Dr. Bernie who isn't too concerned about any Cardiac issues.  I advised the patient to go home and hydrate and rest.  He has plans to drive to a local funeral.  I discouraged this unless he has a driver who can watch him carefully.  With his permission, I also informed his family by phone.  Request old records and await labs.  Will also arrange for follow up soon for his somewhat elevated BP.

## 2024-02-03 NOTE — Progress Notes (Signed)
 Established Patient Office Visit  Subjective   Patient ID: Ryan Ball, male    DOB: July 13, 1930  Age: 89 y.o. MRN: [id number]  No chief complaint on file.   F/u as above.  Well known to me from [facility] from just 1-2 years ago.  He didn't take his Doxepin last night and didn't sleep well.  Feels a bit light headed today.  Clearly not vertigo.  Maybe a little worse when he stands up.  Denies any CP or tightness.  Perhaps a little presyncopal but not bad.    Past Medical History:  Diagnosis Date   Anemia due to antineoplastic chemotherapy 04/13/2020   CAD (coronary artery disease)    f/by Dr. Bernie   Cardiomegaly 06/08/2016   Diffuse large B-cell lymphoma (HCC) 10/30/2019   f/by Oncology locally   Dilated cardiomyopathy (HCC) ejection fraction 45% in summer 2021 by echo 05/20/2020   Esophageal reflux 10/10/2018   Hypercholesteremia 10/10/2018   Nonrheumatic aortic (valve) stenosis     Outpatient Encounter Medications as of 02/03/2024  Medication Sig   doxepin (SINEQUAN) 25 MG capsule Take 1 capsule by mouth at bedtime as needed (Sleeping).   REPLESTA 1.25 MG (50000 UT) WAFR Inject 5,000 Units into the skin every 14 (fourteen) days.   rosuvastatin (CRESTOR) 5 MG tablet Take 1 tablet by mouth every other day.    esomeprazole (NEXIUM) 40 MG capsule daily at 6 (six) AM.   pantoprazole (PROTONIX) 40 MG tablet Take 40 mg by mouth daily. (Patient not taking: Reported on 12/16/2023)   sucralfate (CARAFATE) 1 GM/10ML suspension Take 10 mLs by mouth 4 (four) times daily -  with meals and at bedtime. (Patient not taking: Reported on 12/16/2023)   No facility-administered encounter medications on file as of 02/03/2024.    Social History   Tobacco Use   Smoking status: Never   Smokeless tobacco: Never  Substance Use Topics   Alcohol use: Yes    Alcohol/week: 6.0 standard drinks of alcohol    Types: 6 Glasses of wine per week    Comment: 1-2 glasses of wine 2-3 times a  week   Drug use: Never      Review of Systems  Constitutional:  Positive for malaise/fatigue. Negative for diaphoresis, fever and weight loss.  Respiratory:  Negative for cough, shortness of breath and wheezing.   Cardiovascular:  Negative for chest pain, palpitations, orthopnea, claudication, leg swelling and PND.      Objective:     BP (!) 172/79 (BP Location: Right Arm, Patient Position: Standing, Cuff Size: Normal)   Pulse 83   Temp (!) 97.5 F (36.4 C) (Oral)   Resp 16   Ht 5' 4.96 (1.65 m)   Wt 171 lb 11.2 oz (77.9 kg)   SpO2 97%   BMI 28.61 kg/m    Physical Exam Constitutional:      General: He is not in acute distress.    Appearance: Normal appearance.     Comments: Comfortable, well hydrated.  HENT:     Head: Normocephalic.  Neck:     Vascular: No carotid bruit.  Cardiovascular:     Rate and Rhythm: Normal rate.     Pulses: Normal pulses.     Comments: Irregular rhythm.  2/6 SEM noted. Pulmonary:     Effort: Pulmonary effort is normal.     Breath sounds: Normal breath sounds.  Abdominal:     General: Bowel sounds are normal.     Palpations: Abdomen is soft.  Musculoskeletal:     Cervical back: Neck supple. No tenderness.     Right lower leg: No edema.     Left lower leg: No edema.  Neurological:     General: No focal deficit present.     Mental Status: He is alert.      No results found for any visits on 02/03/24.    The ASCVD Risk score (Arnett DK, et al., 2019) failed to calculate for the following reasons:   The 2019 ASCVD risk score is only valid for ages 46 to 33    Assessment & Plan:  Postural dizziness with presyncope Assessment & Plan: Possibly due to fatigue, etc.  D/w Dr. Bernie who isn't too concerned about any Cardiac issues.  I advised the patient to go home and hydrate and rest.  He has plans to drive to a local funeral.  I discouraged this unless he has a driver who can watch him carefully.  With his permission, I also  informed his family by phone.  Request old records and await labs.  Will also arrange for follow up soon for his somewhat elevated BP.  Orders: -     EKG 12-Lead -     Comprehensive metabolic panel with GFR -     CBC with Differential/Platelet  Nonrheumatic aortic valve stenosis    Return in about 4 weeks (around 03/02/2024) for chronic follow-up.    REDDING PONCE NORLEEN FALCON., MD

## 2024-02-04 LAB — COMPREHENSIVE METABOLIC PANEL WITH GFR
ALT: 18 IU/L (ref 0–44)
AST: 22 IU/L (ref 0–40)
Albumin: 4.2 g/dL (ref 3.6–4.6)
Alkaline Phosphatase: 70 IU/L (ref 48–129)
BUN/Creatinine Ratio: 13 (ref 10–24)
BUN: 12 mg/dL (ref 10–36)
Bilirubin Total: 0.3 mg/dL (ref 0.0–1.2)
CO2: 23 mmol/L (ref 20–29)
Calcium: 9.5 mg/dL (ref 8.6–10.2)
Chloride: 102 mmol/L (ref 96–106)
Creatinine, Ser: 0.91 mg/dL (ref 0.76–1.27)
Globulin, Total: 2.5 g/dL (ref 1.5–4.5)
Glucose: 93 mg/dL (ref 70–99)
Potassium: 4.7 mmol/L (ref 3.5–5.2)
Sodium: 142 mmol/L (ref 134–144)
Total Protein: 6.7 g/dL (ref 6.0–8.5)
eGFR: 80 mL/min/1.73 (ref >59)

## 2024-02-04 LAB — CBC WITH DIFFERENTIAL/PLATELET
Basophils Absolute: 0 x10E3/uL (ref 0.0–0.2)
Basos: 0 %
EOS (ABSOLUTE): 0.3 x10E3/uL (ref 0.0–0.4)
Eos: 3 %
Hematocrit: 43.8 % (ref 37.5–51.0)
Hemoglobin: 13.7 g/dL (ref 13.0–17.7)
Immature Grans (Abs): 0 x10E3/uL (ref 0.0–0.1)
Immature Granulocytes: 0 %
Lymphocytes Absolute: 1.2 x10E3/uL (ref 0.7–3.1)
Lymphs: 15 %
MCH: 28.9 pg (ref 26.6–33.0)
MCHC: 31.3 g/dL — ABNORMAL LOW (ref 31.5–35.7)
MCV: 92 fL (ref 79–97)
Monocytes Absolute: 0.8 x10E3/uL (ref 0.1–0.9)
Monocytes: 10 %
Neutrophils Absolute: 5.8 x10E3/uL (ref 1.4–7.0)
Neutrophils: 72 %
Platelets: 217 x10E3/uL (ref 150–450)
RBC: 4.74 x10E6/uL (ref 4.14–5.80)
RDW: 12.6 % (ref 11.6–15.4)
WBC: 8.2 x10E3/uL (ref 3.4–10.8)

## 2024-02-06 ENCOUNTER — Ambulatory Visit (HOSPITAL_BASED_OUTPATIENT_CLINIC_OR_DEPARTMENT_OTHER): Payer: Self-pay | Admitting: Family Medicine

## 2024-02-06 ENCOUNTER — Telehealth (HOSPITAL_BASED_OUTPATIENT_CLINIC_OR_DEPARTMENT_OTHER): Payer: Self-pay | Admitting: *Deleted

## 2024-02-06 NOTE — Telephone Encounter (Signed)
Pt. Was called

## 2024-03-05 ENCOUNTER — Encounter (HOSPITAL_BASED_OUTPATIENT_CLINIC_OR_DEPARTMENT_OTHER): Payer: Self-pay | Admitting: Family Medicine

## 2024-03-05 ENCOUNTER — Ambulatory Visit (INDEPENDENT_AMBULATORY_CARE_PROVIDER_SITE_OTHER): Admitting: Family Medicine

## 2024-03-05 VITALS — BP 127/77 | HR 88 | Ht 64.96 in | Wt 170.5 lb

## 2024-03-05 DIAGNOSIS — I2510 Atherosclerotic heart disease of native coronary artery without angina pectoris: Secondary | ICD-10-CM | POA: Diagnosis not present

## 2024-03-05 DIAGNOSIS — J310 Chronic rhinitis: Secondary | ICD-10-CM | POA: Diagnosis not present

## 2024-03-05 DIAGNOSIS — M1991 Primary osteoarthritis, unspecified site: Secondary | ICD-10-CM

## 2024-03-05 DIAGNOSIS — F5104 Psychophysiologic insomnia: Secondary | ICD-10-CM | POA: Diagnosis not present

## 2024-03-05 DIAGNOSIS — M1990 Unspecified osteoarthritis, unspecified site: Secondary | ICD-10-CM | POA: Insufficient documentation

## 2024-03-05 DIAGNOSIS — T485X5A Adverse effect of other anti-common-cold drugs, initial encounter: Secondary | ICD-10-CM | POA: Insufficient documentation

## 2024-03-05 MED ORDER — FLUTICASONE PROPIONATE 50 MCG/ACT NA SUSP: 2.0000 | Freq: Every day | NASAL | 6 refills | Status: AC

## 2024-03-05 MED ORDER — PREDNISONE 20 MG PO TABS: 20.0000 mg | ORAL_TABLET | Freq: Two times a day (BID) | ORAL | 0 refills | Status: DC

## 2024-03-05 NOTE — Progress Notes (Signed)
 Established Patient Office Visit  Subjective   Patient ID: Ryan Ball, male    DOB: 10/12/1930  Age: 89 y.o. MRN: [id number]  Chief Complaint  Patient presents with   Medical Management of Chronic Issues    F/u as above.  Overall doing well.  Old records from [facility] don't appear to have been received yet.  He is frustrated with right sided nasal congestion in the evening.  He admits he has been using Afrin NS for some time.  He also got light headed after doing some overhead work a few weeks back.  He feels fine now, and I encouraged him to avoid overhead work in the future given his age and some frailty.  He admits to mild occasional daytime somnolence, but never while driving.  He sleeps fairly well most of the time.    Past Medical History:  Diagnosis Date   Anemia due to antineoplastic chemotherapy 04/13/2020   CAD (coronary artery disease)    f/by Dr. Bernie   Cardiomegaly 06/08/2016   Diffuse large B-cell lymphoma (HCC) 10/30/2019   f/by Oncology locally   Dilated cardiomyopathy (HCC) ejection fraction 45% in summer 2021 by echo 05/20/2020   Esophageal reflux 10/10/2018   Hypercholesteremia 10/10/2018   Nonrheumatic aortic (valve) stenosis    Osteoarthritis     Outpatient Encounter Medications as of 03/05/2024  Medication Sig   doxepin (SINEQUAN) 25 MG capsule Take 1 capsule by mouth at bedtime as needed (Sleeping).   esomeprazole (NEXIUM) 40 MG capsule daily at 6 (six) AM.   fluticasone (FLONASE) 50 MCG/ACT nasal spray Place 2 sprays into both nostrils daily.   predniSONE (DELTASONE) 20 MG tablet Take 1 tablet (20 mg total) by mouth 2 (two) times daily with a meal.   REPLESTA 1.25 MG (50000 UT) WAFR Inject 5,000 Units into the skin every 14 (fourteen) days.   rosuvastatin (CRESTOR) 5 MG tablet Take 1 tablet by mouth every other day.    pantoprazole (PROTONIX) 40 MG tablet Take 40 mg by mouth daily. (Patient not taking: Reported on 03/05/2024)    sucralfate (CARAFATE) 1 GM/10ML suspension Take 10 mLs by mouth 4 (four) times daily -  with meals and at bedtime. (Patient not taking: Reported on 03/05/2024)   No facility-administered encounter medications on file as of 03/05/2024.    Social History   Tobacco Use   Smoking status: Never   Smokeless tobacco: Never  Substance Use Topics   Alcohol use: Yes    Alcohol/week: 6.0 standard drinks of alcohol    Types: 6 Glasses of wine per week    Comment: 1-2 glasses of wine 2-3 times a week   Drug use: Never      Review of Systems  Constitutional:  Negative for diaphoresis, fever, malaise/fatigue and weight loss.  Respiratory:  Negative for cough, shortness of breath and wheezing.   Cardiovascular:  Negative for chest pain, palpitations, orthopnea, claudication, leg swelling and PND.      Objective:     BP 127/77   Pulse 88   Ht 5' 4.96 (1.65 m)   Wt 170 lb 8 oz (77.3 kg)   SpO2 93%   BMI 28.41 kg/m    Physical Exam Constitutional:      General: He is not in acute distress.    Appearance: He is not ill-appearing, toxic-appearing or diaphoretic.  HENT:     Right Ear: Tympanic membrane normal.     Left Ear: Tympanic membrane normal.  Nose: Congestion present. No rhinorrhea.     Right Sinus: No maxillary sinus tenderness or frontal sinus tenderness.     Left Sinus: No maxillary sinus tenderness or frontal sinus tenderness.     Mouth/Throat:     Pharynx: Posterior oropharyngeal erythema present. No oropharyngeal exudate.  Eyes:     Conjunctiva/sclera: Conjunctivae normal.  Cardiovascular:     Rate and Rhythm: Normal rate and regular rhythm.     Heart sounds: Normal heart sounds.  Pulmonary:     Breath sounds: Normal breath sounds.  Abdominal:     Palpations: Abdomen is soft.     Tenderness: There is no abdominal tenderness.  Musculoskeletal:     Cervical back: Normal range of motion and neck supple. No tenderness.  Lymphadenopathy:     Cervical: No  cervical adenopathy.  Skin:    General: Skin is warm and dry.     Findings: No rash.  Neurological:     Mental Status: He is alert.      No results found for any visits on 03/05/24.    The ASCVD Risk score (Arnett DK, et al., 2019) failed to calculate for the following reasons:   The 2019 ASCVD risk score is only valid for ages 6 to 60    Assessment & Plan:  Rhinitis medicamentosa Assessment & Plan: Stop the Afrin NS.  Orders: -     Fluticasone Propionate; Place 2 sprays into both nostrils daily.  Dispense: 16 g; Refill: 6 -     predniSONE; Take 1 tablet (20 mg total) by mouth 2 (two) times daily with a meal.  Dispense: 8 tablet; Refill: 0  Chronic insomnia Assessment & Plan: Reminded to use good sleep hygiene.  I'm not too concerned about him occasionally nodding off a bit in his office.  May eventually need a different rx for the insomnia.   Coronary artery disease involving native coronary artery of native heart without angina pectoris Assessment & Plan: F/u with Dr. Krasowski as directed.  Again request records from [facility].   Primary osteoarthritis, unspecified site Assessment & Plan: Mild discomfort is controlled.     Return in about 3 months (around 06/05/2024) for chronic follow-up.    REDDING PONCE NORLEEN FALCON., MD

## 2024-03-05 NOTE — Assessment & Plan Note (Signed)
 Stop the Afrin NS.

## 2024-03-05 NOTE — Assessment & Plan Note (Signed)
 Reminded to use good sleep hygiene.  I'm not too concerned about him occasionally nodding off a bit in his office.  May eventually need a different rx for the insomnia.

## 2024-03-05 NOTE — Assessment & Plan Note (Signed)
 F/u with Dr. Krasowski as directed.  Again request records from [facility].

## 2024-03-05 NOTE — Assessment & Plan Note (Signed)
 Mild discomfort is controlled.

## 2024-03-20 ENCOUNTER — Ambulatory Visit (INDEPENDENT_AMBULATORY_CARE_PROVIDER_SITE_OTHER): Admitting: Family Medicine

## 2024-03-20 ENCOUNTER — Encounter (HOSPITAL_BASED_OUTPATIENT_CLINIC_OR_DEPARTMENT_OTHER): Payer: Self-pay | Admitting: Family Medicine

## 2024-03-20 ENCOUNTER — Ambulatory Visit (HOSPITAL_BASED_OUTPATIENT_CLINIC_OR_DEPARTMENT_OTHER)
Admission: RE | Admit: 2024-03-20 | Discharge: 2024-03-20 | Disposition: A | Source: Ambulatory Visit | Attending: Family Medicine | Admitting: Family Medicine

## 2024-03-20 VITALS — BP 187/96 | HR 86 | Temp 97.3°F | Resp 16 | Wt 172.2 lb

## 2024-03-20 DIAGNOSIS — M542 Cervicalgia: Secondary | ICD-10-CM | POA: Insufficient documentation

## 2024-03-20 DIAGNOSIS — R030 Elevated blood-pressure reading, without diagnosis of hypertension: Secondary | ICD-10-CM

## 2024-03-20 MED ORDER — OXYCODONE HCL 5 MG PO TABS: 5.0000 mg | ORAL_TABLET | Freq: Every day | ORAL | 0 refills | Status: AC

## 2024-03-20 NOTE — Progress Notes (Unsigned)
 Established Patient Office Visit  Subjective   Patient ID: Ryan Ball, male    DOB: Apr 21, 1931  Age: 89 y.o. MRN: [id number]  Chief Complaint  Patient presents with   Neck Pain    Neck pain     F/u as above.  One day of worsening posterior neck pain.  No falls or trauma.  No obvious overexertion.  He has been dealing with a little more stress than usual.  No UE pain or other symptoms.  No fevers.  No known rashes.  Neck Pain     Past Medical History:  Diagnosis Date   Anemia due to antineoplastic chemotherapy 04/13/2020   CAD (coronary artery disease)    f/by Dr. Bernie   Cardiomegaly 06/08/2016   Diffuse large B-cell lymphoma (HCC) 10/30/2019   f/by Oncology locally   Dilated cardiomyopathy (HCC) ejection fraction 45% in summer 2021 by echo 05/20/2020   Esophageal reflux 10/10/2018   Hypercholesteremia 10/10/2018   Nonrheumatic aortic (valve) stenosis    Osteoarthritis     Outpatient Encounter Medications as of 03/20/2024  Medication Sig   oxyCODONE (ROXICODONE) 5 MG immediate release tablet Take 1 tablet (5 mg total) by mouth at bedtime for 5 days.   doxepin (SINEQUAN) 25 MG capsule Take 1 capsule by mouth at bedtime as needed (Sleeping).   esomeprazole (NEXIUM) 40 MG capsule daily at 6 (six) AM.   fluticasone (FLONASE) 50 MCG/ACT nasal spray Place 2 sprays into both nostrils daily.   pantoprazole (PROTONIX) 40 MG tablet Take 40 mg by mouth daily. (Patient not taking: Reported on 03/05/2024)   predniSONE (DELTASONE) 20 MG tablet Take 1 tablet (20 mg total) by mouth 2 (two) times daily with a meal.   REPLESTA 1.25 MG (50000 UT) WAFR Inject 5,000 Units into the skin every 14 (fourteen) days.   rosuvastatin (CRESTOR) 5 MG tablet Take 1 tablet by mouth every other day.    sucralfate (CARAFATE) 1 GM/10ML suspension Take 10 mLs by mouth 4 (four) times daily -  with meals and at bedtime. (Patient not taking: Reported on 03/05/2024)   No facility-administered  encounter medications on file as of 03/20/2024.    Social History   Tobacco Use   Smoking status: Never   Smokeless tobacco: Never  Substance Use Topics   Alcohol use: Yes    Alcohol/week: 6.0 standard drinks of alcohol    Types: 6 Glasses of wine per week    Comment: 1-2 glasses of wine 2-3 times a week   Drug use: Never      Review of Systems  Musculoskeletal:  Positive for neck pain.      Objective:     BP (!) 187/96 (Cuff Size: Normal)   Pulse 86   Temp (!) 97.3 F (36.3 C) (Oral)   Resp 16   Wt 172 lb 3.2 oz (78.1 kg)   SpO2 98%   BMI 28.69 kg/m    Physical Exam Constitutional:      General: He is not in acute distress.    Appearance: Normal appearance.  HENT:     Head: Normocephalic.  Neck:     Comments: Moderate posterior tenderness with some rigidity but no Meningismus. Cardiovascular:     Rate and Rhythm: Normal rate and regular rhythm.     Pulses: Normal pulses.     Heart sounds: Normal heart sounds.  Pulmonary:     Effort: Pulmonary effort is normal.     Breath sounds: Normal breath sounds.  Abdominal:  General: Bowel sounds are normal.     Palpations: Abdomen is soft.  Musculoskeletal:     Cervical back: Tenderness present.     Right lower leg: No edema.     Left lower leg: No edema.  Skin:    Findings: No rash.  Neurological:     General: No focal deficit present.     Mental Status: He is alert.     Comments: Good UE strength      No results found for any visits on 03/20/24.    The ASCVD Risk score (Arnett DK, et al., 2019) failed to calculate for the following reasons:   The 2019 ASCVD risk score is only valid for ages 19 to 84    Assessment & Plan:  Neck pain, acute Assessment & Plan: Likely related to OA and soft tissue inflammation.  Moist heat, rest, modified activity.  PT referral.  Hold his current sleeping pill and advised strict alcohol avoidance for now.  Fall risk reviewed with his family.  Will check on him  tomorrow.  Orders: -     DG Cervical Spine 2 or 3 views -     oxyCODONE HCl; Take 1 tablet (5 mg total) by mouth at bedtime for 5 days.  Dispense: 5 tablet; Refill: 0 -     Ambulatory referral to Physical Therapy  Elevated BP without diagnosis of hypertension Assessment & Plan: Obviously related to his discomfort.  Encourage a nursing BP check soon.     No follow-ups on file.    REDDING PONCE NORLEEN FALCON., MD

## 2024-03-21 ENCOUNTER — Ambulatory Visit (HOSPITAL_BASED_OUTPATIENT_CLINIC_OR_DEPARTMENT_OTHER): Payer: Self-pay | Admitting: Family Medicine

## 2024-03-21 DIAGNOSIS — R030 Elevated blood-pressure reading, without diagnosis of hypertension: Secondary | ICD-10-CM | POA: Insufficient documentation

## 2024-03-21 NOTE — Assessment & Plan Note (Addendum)
 Likely related to OA and soft tissue inflammation.  Moist heat, rest, modified activity.  PT referral.  Hold his current sleeping pill and advised strict alcohol avoidance for now.  Fall risk reviewed with his family.  Will check on him tomorrow.

## 2024-03-21 NOTE — Assessment & Plan Note (Signed)
 Obviously related to his discomfort.  Encourage a nursing BP check soon.

## 2024-03-23 ENCOUNTER — Ambulatory Visit (HOSPITAL_BASED_OUTPATIENT_CLINIC_OR_DEPARTMENT_OTHER): Admitting: *Deleted

## 2024-03-23 NOTE — Progress Notes (Signed)
 Patient is in office today for a nurse visit for Blood Pressure Check. Patient blood pressure was 120/73, Patient none

## 2024-03-27 ENCOUNTER — Ambulatory Visit (INDEPENDENT_AMBULATORY_CARE_PROVIDER_SITE_OTHER): Admitting: Family Medicine

## 2024-03-27 ENCOUNTER — Encounter (HOSPITAL_BASED_OUTPATIENT_CLINIC_OR_DEPARTMENT_OTHER): Payer: Self-pay | Admitting: Family Medicine

## 2024-03-27 VITALS — BP 120/69 | HR 88 | Temp 97.5°F | Resp 16 | Wt 169.4 lb

## 2024-03-27 DIAGNOSIS — M1991 Primary osteoarthritis, unspecified site: Secondary | ICD-10-CM | POA: Diagnosis not present

## 2024-03-27 DIAGNOSIS — J40 Bronchitis, not specified as acute or chronic: Secondary | ICD-10-CM | POA: Diagnosis not present

## 2024-03-27 DIAGNOSIS — J329 Chronic sinusitis, unspecified: Secondary | ICD-10-CM | POA: Diagnosis not present

## 2024-03-27 MED ORDER — AMOXICILLIN-POT CLAVULANATE 875-125 MG PO TABS: 1.0000 | ORAL_TABLET | Freq: Two times a day (BID) | ORAL | 0 refills | Status: DC

## 2024-03-27 MED ORDER — BENZONATATE 200 MG PO CAPS: 200.0000 mg | ORAL_CAPSULE | Freq: Three times a day (TID) | ORAL | 0 refills | Status: DC | PRN

## 2024-03-27 NOTE — Progress Notes (Signed)
 Established Patient Office Visit  Subjective   Patient ID: Ryan Ball, male    DOB: [birth year]  Age: 89 y.o. MRN: [id number]  Chief Complaint  Patient presents with   URI    Cold Symptoms    He reports 3-4 weeks of nasal congestion with foul drainage and coughing.  He didn't mention this at his last appt., nor did he have much sinus congestion at that time.  No fever.  Clearly focused in his sinuses.  Has been appropriately off of the decongestant nasal spray for weeks now.  URI     Past Medical History:  Diagnosis Date   Anemia due to antineoplastic chemotherapy 04/13/2020   CAD (coronary artery disease)    f/by Dr. Bernie   Cardiomegaly 06/08/2016   Diffuse large B-cell lymphoma (HCC) 10/30/2019   f/by Oncology locally   Dilated cardiomyopathy (HCC) ejection fraction 45% in summer 2021 by echo 05/20/2020   Esophageal reflux 10/10/2018   Hypercholesteremia 10/10/2018   Nonrheumatic aortic (valve) stenosis    Osteoarthritis     Outpatient Encounter Medications as of 03/27/2024  Medication Sig   amoxicillin-clavulanate (AUGMENTIN) 875-125 MG tablet Take 1 tablet by mouth 2 (two) times daily.   benzonatate (TESSALON) 200 MG capsule Take 1 capsule (200 mg total) by mouth 3 (three) times daily as needed for cough.   doxepin (SINEQUAN) 25 MG capsule Take 1 capsule by mouth at bedtime as needed (Sleeping).   esomeprazole (NEXIUM) 40 MG capsule daily at 6 (six) AM.   fluticasone (FLONASE) 50 MCG/ACT nasal spray Place 2 sprays into both nostrils daily.   predniSONE (DELTASONE) 20 MG tablet Take 1 tablet (20 mg total) by mouth 2 (two) times daily with a meal.   REPLESTA 1.25 MG (50000 UT) WAFR Inject 5,000 Units into the skin every 14 (fourteen) days.   rosuvastatin (CRESTOR) 5 MG tablet Take 1 tablet by mouth every other day.    pantoprazole (PROTONIX) 40 MG tablet Take 40 mg by mouth daily. (Patient not taking: Reported on 03/05/2024)   sucralfate (CARAFATE) 1  GM/10ML suspension Take 10 mLs by mouth 4 (four) times daily -  with meals and at bedtime. (Patient not taking: Reported on 03/05/2024)   No facility-administered encounter medications on file as of 03/27/2024.    Social History   Tobacco Use   Smoking status: Never   Smokeless tobacco: Never  Substance Use Topics   Alcohol use: Yes    Alcohol/week: 6.0 standard drinks of alcohol    Types: 6 Glasses of wine per week    Comment: 1-2 glasses of wine 2-3 times a week   Drug use: Never      ROS    Objective:     BP 120/69 (BP Location: Right Arm, Patient Position: Standing, Cuff Size: Normal)   Pulse 88   Temp (!) 97.5 F (36.4 C) (Oral)   Resp 16   Wt 169 lb 6.4 oz (76.8 kg)   SpO2 94%   BMI 28.22 kg/m    Physical Exam Constitutional:      General: He is not in acute distress.    Appearance: He is ill-appearing. He is not toxic-appearing.  HENT:     Right Ear: Tympanic membrane normal.     Left Ear: Tympanic membrane normal.     Nose: Congestion and rhinorrhea present. Rhinorrhea is purulent.     Right Sinus: Maxillary sinus tenderness present.     Left Sinus: Maxillary sinus tenderness present.  Mouth/Throat:     Pharynx: Oropharyngeal exudate, posterior oropharyngeal erythema, uvula swelling and postnasal drip present.     Tonsils: No tonsillar abscesses.  Eyes:     Conjunctiva/sclera: Conjunctivae normal.  Cardiovascular:     Rate and Rhythm: Normal rate and regular rhythm.     Heart sounds: Normal heart sounds.  Pulmonary:     Effort: Pulmonary effort is normal.     Breath sounds: Normal breath sounds.  Abdominal:     Palpations: Abdomen is soft.     Tenderness: There is no abdominal tenderness.  Musculoskeletal:     Cervical back: Normal range of motion and neck supple. No rigidity.     Right lower leg: No edema.     Left lower leg: No edema.  Lymphadenopathy:     Cervical: Cervical adenopathy present.  Neurological:     Mental Status: He is  alert.      No results found for any visits on 03/27/24.    The ASCVD Risk score (Arnett DK, et al., 2019) failed to calculate for the following reasons:   The 2019 ASCVD risk score is only valid for ages 11 to 19    Assessment & Plan:  Sinobronchitis Assessment & Plan: Advised plain Mucinex prn.  Take abx with food and take probiotics daily for now.  OK to use Afrin NS at hs for up to 2 nights prn.  I discouraged oral decongestants.  Alert us  if not better in 3-4 days.  Orders: -     Amoxicillin-Pot Clavulanate; Take 1 tablet by mouth 2 (two) times daily.  Dispense: 20 tablet; Refill: 0 -     Benzonatate; Take 1 capsule (200 mg total) by mouth 3 (three) times daily as needed for cough.  Dispense: 30 capsule; Refill: 0  Primary osteoarthritis, unspecified site Assessment & Plan: Discomfort improved.  Continue PT as needed.     No follow-ups on file.    REDDING PONCE NORLEEN FALCON., MD

## 2024-03-27 NOTE — Assessment & Plan Note (Signed)
 Discomfort improved.  Continue PT as needed.

## 2024-03-27 NOTE — Assessment & Plan Note (Signed)
 Advised plain Mucinex prn.  Take abx with food and take probiotics daily for now.  OK to use Afrin NS at hs for up to 2 nights prn.  I discouraged oral decongestants.  Alert us  if not better in 3-4 days.

## 2024-04-02 ENCOUNTER — Ambulatory Visit (INDEPENDENT_AMBULATORY_CARE_PROVIDER_SITE_OTHER): Admitting: Family Medicine

## 2024-04-02 ENCOUNTER — Other Ambulatory Visit (HOSPITAL_BASED_OUTPATIENT_CLINIC_OR_DEPARTMENT_OTHER): Payer: Self-pay | Admitting: Family Medicine

## 2024-04-02 VITALS — BP 127/64 | HR 86 | Temp 97.4°F | Resp 17 | Wt 169.4 lb

## 2024-04-02 DIAGNOSIS — J310 Chronic rhinitis: Secondary | ICD-10-CM

## 2024-04-02 DIAGNOSIS — T485X5A Adverse effect of other anti-common-cold drugs, initial encounter: Secondary | ICD-10-CM

## 2024-04-02 DIAGNOSIS — M1991 Primary osteoarthritis, unspecified site: Secondary | ICD-10-CM

## 2024-04-02 DIAGNOSIS — R42 Dizziness and giddiness: Secondary | ICD-10-CM

## 2024-04-02 LAB — COMPREHENSIVE METABOLIC PANEL WITH GFR
ALT: 13 IU/L (ref 0–50)
AST: 20 IU/L (ref 15–51)
Albumin: 3.9 g/dL (ref 3.6–4.6)
Alkaline Phosphatase: 77 IU/L (ref 48–129)
BUN/Creatinine Ratio: 12 (ref 10–24)
BUN: 12 mg/dL (ref 10–36)
Bilirubin Total: <0.2 mg/dL (ref 0.0–1.2)
CO2: 26 mmol/L (ref 20–29)
Calcium: 9.3 mg/dL (ref 8.6–10.2)
Chloride: 102 mmol/L (ref 96–106)
Creatinine, Ser: 1 mg/dL (ref 0.76–1.27)
Globulin, Total: 1.9 g/dL (ref 1.5–4.5)
Glucose: 107 mg/dL — ABNORMAL HIGH (ref 70–99)
Potassium: 4.1 mmol/L (ref 3.5–5.2)
Sodium: 141 mmol/L (ref 134–144)
Total Protein: 5.8 g/dL — ABNORMAL LOW (ref 6.0–8.5)
eGFR: 71 mL/min/1.73 (ref >59)

## 2024-04-02 LAB — CBC WITH DIFFERENTIAL/PLATELET
Basophils Absolute: 0 x10E3/uL (ref 0.0–0.2)
Basos: 0 %
EOS (ABSOLUTE): 0.2 x10E3/uL (ref 0.0–0.4)
Eos: 2 %
Hematocrit: 36 % — ABNORMAL LOW (ref 37.5–51.0)
Hemoglobin: 12.2 g/dL — ABNORMAL LOW (ref 13.0–17.7)
Lymphocytes Absolute: 1.2 x10E3/uL (ref 0.7–3.1)
Lymphs: 10 %
MCH: 30.7 pg (ref 26.6–33.0)
MCHC: 33.9 g/dL (ref 31.5–35.7)
MCV: 91 fL (ref 79–97)
Monocytes Absolute: 1 x10E3/uL — ABNORMAL HIGH (ref 0.1–0.9)
Monocytes: 8 %
Neutrophils Absolute: 9.3 x10E3/uL — ABNORMAL HIGH (ref 1.4–7.0)
Neutrophils: 80 %
Platelets: 329 x10E3/uL (ref 150–450)
RBC: 3.97 x10E6/uL — ABNORMAL LOW (ref 4.14–5.80)
RDW: 13.7 % (ref 11.6–15.4)
WBC: 11.7 x10E3/uL — ABNORMAL HIGH (ref 3.4–10.8)

## 2024-04-02 MED ORDER — METHYLPREDNISOLONE 4 MG PO TBPK: ORAL_TABLET | ORAL | 0 refills | Status: AC

## 2024-04-02 NOTE — Assessment & Plan Note (Addendum)
 Likely due to residual, mild sinus congestion.  Stat labs as a precaution.  ENT can assist with this as well.  Extended discussion with his daughter.  I discouraged him driving for now.  Plans reviewed in detail with his daughter.  If he clearly worsens, he is to be taken to the ER.

## 2024-04-02 NOTE — Progress Notes (Signed)
 Established Patient Office Visit  Subjective   Patient ID: Ryan Ball, male    DOB: 1930-10-23  Age: 89 y.o. MRN: [id number]  Chief Complaint  Patient presents with   nose discomfort     Nose discomfort     F/u as above.  Please see last note for details.  His sinus congestion and pressure are improving, but he does have some residual nonvertiginous dizziness.  Right nare remains irritated.  No fever, ST, or cough.  Some malaise.  Not sleeping well due to the recent nasal congestion.  He got confused after his last visit and continued his topical decongestant for about a week.    Past Medical History:  Diagnosis Date   Anemia due to antineoplastic chemotherapy 04/13/2020   CAD (coronary artery disease)    f/by Dr. Bernie   Cardiomegaly 06/08/2016   Diffuse large B-cell lymphoma (HCC) 10/30/2019   f/by Oncology locally   Dilated cardiomyopathy (HCC) ejection fraction 45% in summer 2021 by echo 05/20/2020   Esophageal reflux 10/10/2018   Hypercholesteremia 10/10/2018   Nonrheumatic aortic (valve) stenosis    Osteoarthritis     Outpatient Encounter Medications as of 04/02/2024  Medication Sig   amoxicillin -clavulanate (AUGMENTIN ) 875-125 MG tablet Take 1 tablet by mouth 2 (two) times daily.   benzonatate  (TESSALON ) 200 MG capsule Take 1 capsule (200 mg total) by mouth 3 (three) times daily as needed for cough.   doxepin (SINEQUAN) 25 MG capsule Take 1 capsule by mouth at bedtime as needed (Sleeping).   esomeprazole (NEXIUM) 40 MG capsule daily at 6 (six) AM.   fluticasone  (FLONASE ) 50 MCG/ACT nasal spray Place 2 sprays into both nostrils daily.   methylPREDNISolone  (MEDROL  DOSEPAK) 4 MG TBPK tablet 6 day dosepack as directed   REPLESTA 1.25 MG (50000 UT) WAFR Inject 5,000 Units into the skin every 14 (fourteen) days.   rosuvastatin (CRESTOR) 5 MG tablet Take 1 tablet by mouth every other day.    [DISCONTINUED] predniSONE  (DELTASONE ) 20 MG tablet Take 1 tablet  (20 mg total) by mouth 2 (two) times daily with a meal.   pantoprazole (PROTONIX) 40 MG tablet Take 40 mg by mouth daily. (Patient not taking: Reported on 03/05/2024)   sucralfate (CARAFATE) 1 GM/10ML suspension Take 10 mLs by mouth 4 (four) times daily -  with meals and at bedtime. (Patient not taking: Reported on 03/05/2024)   No facility-administered encounter medications on file as of 04/02/2024.    Social History   Tobacco Use   Smoking status: Never   Smokeless tobacco: Never  Substance Use Topics   Alcohol use: Yes    Alcohol/week: 6.0 standard drinks of alcohol    Types: 6 Glasses of wine per week    Comment: 1-2 glasses of wine 2-3 times a week   Drug use: Never      Review of Systems  Constitutional:  Positive for malaise/fatigue. Negative for fever.  HENT:  Positive for hearing loss.   Cardiovascular:  Negative for chest pain.  Gastrointestinal:  Negative for abdominal pain.  Skin:  Negative for rash.  Neurological:  Positive for dizziness. Negative for tingling, tremors, sensory change, focal weakness, seizures, loss of consciousness and headaches.  Psychiatric/Behavioral:  Negative for depression, hallucinations and memory loss. The patient does not have insomnia.       Objective:     BP 127/64 (BP Location: Right Arm, Patient Position: Standing, Cuff Size: Normal)   Pulse 86   Temp (!) 97.4 F (36.3 C) (  Oral)   Resp 17   Wt 169 lb 6.4 oz (76.8 kg)   SpO2 96%   BMI 28.22 kg/m    Physical Exam Constitutional:      General: He is not in acute distress.    Appearance: He is not ill-appearing, toxic-appearing or diaphoretic.     Comments: Slightly unsteady on his feet.  HENT:     Head:     Comments: TM's scarred, but not infected.  Unable to see any intranasal pathology.    Right Ear: Ear canal normal.     Left Ear: Ear canal normal.     Nose: Nose normal.     Right Sinus: No maxillary sinus tenderness or frontal sinus tenderness.     Left Sinus: No  maxillary sinus tenderness or frontal sinus tenderness.     Mouth/Throat:     Pharynx: No oropharyngeal exudate or posterior oropharyngeal erythema.  Eyes:     Conjunctiva/sclera: Conjunctivae normal.  Cardiovascular:     Rate and Rhythm: Normal rate. Rhythm irregular.     Heart sounds: Normal heart sounds.  Pulmonary:     Breath sounds: Normal breath sounds.  Abdominal:     Palpations: Abdomen is soft.     Tenderness: There is no abdominal tenderness.  Musculoskeletal:     Cervical back: Normal range of motion and neck supple. No tenderness.  Lymphadenopathy:     Cervical: No cervical adenopathy.  Skin:    General: Skin is warm and dry.     Findings: No rash.  Neurological:     General: No focal deficit present.     Mental Status: He is alert.     Cranial Nerves: No cranial nerve deficit.     Motor: No weakness.     Coordination: Coordination normal.     Gait: Gait normal.     Comments: FNF testing is normal.  Gait is slightly slower and stiffer than his baseline.      No results found for any visits on 04/02/24.    The ASCVD Risk score (Arnett DK, et al., 2019) failed to calculate for the following reasons:   The 2019 ASCVD risk score is only valid for ages 39 to 64    Assessment & Plan:  Rhinitis medicamentosa Assessment & Plan: Stop topical decongestant.  Resume Flonase  NS.  ENT referral for a closer inspection as they see fit.  Orders: -     methylPREDNISolone ; 6 day dosepack as directed  Dispense: 21 each; Refill: 0  Dizziness Assessment & Plan: Likely due to residual, mild sinus congestion.  Stat labs as a precaution.  ENT can assist with this as well.  Extended discussion with his daughter.  I discouraged him driving for now.  Plans reviewed in detail with his daughter.  If he clearly worsens, he is to be taken to the ER.  Orders: -     CBC with Differential/Platelet -     Comprehensive metabolic panel with GFR -     TSH -     Ambulatory referral to  ENT  Primary osteoarthritis, unspecified site    No follow-ups on file.    REDDING PONCE NORLEEN FALCON., MD

## 2024-04-02 NOTE — Assessment & Plan Note (Signed)
 Stop topical decongestant.  Resume Flonase  NS.  ENT referral for a closer inspection as they see fit.

## 2024-04-03 ENCOUNTER — Ambulatory Visit (HOSPITAL_BASED_OUTPATIENT_CLINIC_OR_DEPARTMENT_OTHER): Payer: Self-pay | Admitting: Family Medicine

## 2024-04-03 ENCOUNTER — Telehealth (HOSPITAL_BASED_OUTPATIENT_CLINIC_OR_DEPARTMENT_OTHER): Payer: Self-pay | Admitting: *Deleted

## 2024-04-03 LAB — TSH: TSH: 3.55 u[IU]/mL (ref 0.450–4.500)

## 2024-04-03 NOTE — Telephone Encounter (Signed)
 LMOM

## 2024-04-07 ENCOUNTER — Ambulatory Visit (HOSPITAL_BASED_OUTPATIENT_CLINIC_OR_DEPARTMENT_OTHER)
Admission: EM | Admit: 2024-04-07 | Discharge: 2024-04-07 | Disposition: A | Discharge status: Home / Self Care | Attending: Family Medicine | Admitting: Family Medicine

## 2024-04-07 ENCOUNTER — Encounter (HOSPITAL_BASED_OUTPATIENT_CLINIC_OR_DEPARTMENT_OTHER): Payer: Self-pay | Admitting: Emergency Medicine

## 2024-04-07 DIAGNOSIS — J340 Abscess, furuncle and carbuncle of nose: Secondary | ICD-10-CM

## 2024-04-07 DIAGNOSIS — R0981 Nasal congestion: Secondary | ICD-10-CM | POA: Diagnosis not present

## 2024-04-07 MED ORDER — MUPIROCIN 2 % EX OINT: TOPICAL_OINTMENT | CUTANEOUS | 0 refills | Status: AC

## 2024-04-07 NOTE — ED Provider Notes (Signed)
 [facility]    CSN: [id number] Arrival date & time: 04/07/24  1011      History   Chief Complaint Chief Complaint  Patient presents with   Nasal Congestion    HPI Ryan Ball is a 89 y.o. male.   89 year old male here with report of nasal congestion in his right nostril.  He reports that he was treated with Augmentin , steroid Dosepak and fluticasone  nasal spray by primary care and most of his nasal congestion improved.  But now he has a burning in the right nostril and when he goes to sleep his nostril seems to seal up and he cannot breathe and he cannot sleep.     Past Medical History:  Diagnosis Date   Anemia due to antineoplastic chemotherapy 04/13/2020   CAD (coronary artery disease)    f/by Dr. Bernie   Cardiomegaly 06/08/2016   Diffuse large B-cell lymphoma (HCC) 10/30/2019   f/by Oncology locally   Dilated cardiomyopathy (HCC) ejection fraction 45% in summer 2021 by echo 05/20/2020   Esophageal reflux 10/10/2018   Hypercholesteremia 10/10/2018   Nonrheumatic aortic (valve) stenosis    Osteoarthritis     Patient Active Problem List   Diagnosis Date Noted   Sinobronchitis 03/27/2024   Elevated BP without diagnosis of hypertension 03/21/2024   Neck pain, acute 03/20/2024   Rhinitis medicamentosa 03/05/2024   Chronic insomnia 03/05/2024   Osteoarthritis 03/05/2024   Dizziness 02/03/2024   Anemia 11/16/2022   CAD (coronary artery disease) 03/06/2021   Aortic stenosis 09/17/2020   Dilated cardiomyopathy (HCC) ejection fraction 45% in summer 2021 by echo 05/20/2020   Anemia due to antineoplastic chemotherapy 04/13/2020    Class: Chronic   Encounter for venous access device care 04/09/2020   Postoperative examination 12/12/2019   Senile nuclear sclerosis, bilateral 11/02/2019   Diffuse large B-cell lymphoma (HCC) 10/30/2019   SVT (supraventricular tachycardia) 09/20/2019   Sleep disturbance 10/10/2018   Esophageal reflux 10/10/2018    Hypercholesteremia 10/10/2018   Atypical chest pain 10/10/2018   Presbycusis of both ears 06/19/2018   Cardiomegaly 06/08/2016   Senile nuclear sclerosis 05/19/2012    Past Surgical History:  Procedure Laterality Date   CHOLECYSTECTOMY         Home Medications    Prior to Admission medications   Medication Sig Start Date End Date Taking? Authorizing Provider  fluticasone  (FLONASE ) 50 MCG/ACT nasal spray Place 2 sprays into both nostrils daily. 03/05/24  Yes Dottie Norleen MANO II, MD  methylPREDNISolone  (MEDROL  DOSEPAK) 4 MG TBPK tablet 6 day dosepack as directed 04/02/24  Yes Dottie Norleen MANO PONCE, MD  mupirocin ointment (BACTROBAN) 2 % Apply with a Q-tip into her right nostril nightly and may use morning and night if needed. 04/07/24  Yes Ival Domino, FNP  doxepin (SINEQUAN) 25 MG capsule Take 1 capsule by mouth at bedtime as needed (Sleeping). 04/17/18   [provider]  esomeprazole (NEXIUM) 40 MG capsule daily at 6 (six) AM. 11/02/23   [provider]  REPLESTA 1.25 MG (50000 UT) WAFR Inject 5,000 Units into the skin every 14 (fourteen) days.    [provider]  rosuvastatin (CRESTOR) 5 MG tablet Take 1 tablet by mouth every other day.  01/08/10   [provider]    Family History Family History  Problem Relation Age of Onset   CAD Father    CAD Brother     Social History Social History   Tobacco Use   Smoking status: Never  Smokeless tobacco: Never  Substance Use Topics   Alcohol use: Yes    Alcohol/week: 6.0 standard drinks of alcohol    Types: 6 Glasses of wine per week    Comment: 1-2 glasses of wine 2-3 times a week   Drug use: Never     Allergies   Patient has no known allergies.   Review of Systems Review of Systems  Constitutional:  Negative for chills and fever.  HENT:  Positive for congestion. Negative for ear pain and sore throat.        Nasal congestion and nasal ulcer  Eyes:  Negative for pain and visual  disturbance.  Respiratory:  Negative for cough.   Cardiovascular:  Negative for chest pain and palpitations.  Gastrointestinal:  Negative for abdominal pain, constipation, diarrhea, nausea and vomiting.  Genitourinary:  Negative for dysuria and hematuria.  Musculoskeletal:  Negative for arthralgias and back pain.  Skin:  Negative for color change and rash.  Neurological:  Negative for seizures and syncope.  All other systems reviewed and are negative.    Physical Exam Triage Vital Signs ED Triage Vitals  Encounter Vitals Group     BP 04/07/24 1223 (!) 147/72     Girls Systolic BP Percentile --      Girls Diastolic BP Percentile --      Boys Systolic BP Percentile --      Boys Diastolic BP Percentile --      Pulse Rate 04/07/24 1223 76     Resp 04/07/24 1223 18     Temp 04/07/24 1223 97.8 F (36.6 C)     Temp Source 04/07/24 1223 Oral     SpO2 04/07/24 1223 95 %     Weight --      Height --      Head Circumference --      Peak Flow --      Pain Score 04/07/24 1221 0     Pain Loc --      Pain Education --      Exclude from Growth Chart --    No data found.  Updated Vital Signs BP (!) 147/72 (BP Location: Right Arm)   Pulse 76   Temp 97.8 F (36.6 C) (Oral)   Resp 18   SpO2 95%   Visual Acuity Right Eye Distance:   Left Eye Distance:   Bilateral Distance:    Right Eye Near:   Left Eye Near:    Bilateral Near:     Physical Exam Vitals and nursing note reviewed.  Constitutional:      General: He is not in acute distress.    Appearance: He is well-developed. He is not ill-appearing or toxic-appearing.  HENT:     Head: Normocephalic and atraumatic.     Right Ear: Hearing, tympanic membrane, ear canal and external ear normal.     Left Ear: Hearing, tympanic membrane, ear canal and external ear normal.     Nose: Nasal tenderness (Right nostril with a 2 mm superficial ulceration on the outer nostril approximately 1 cm above the nostril opening), mucosal edema,  congestion and rhinorrhea present. Rhinorrhea is clear.     Right Sinus: No maxillary sinus tenderness or frontal sinus tenderness.     Left Sinus: No maxillary sinus tenderness or frontal sinus tenderness.     Mouth/Throat:     Lips: Pink.     Mouth: Mucous membranes are moist.     Pharynx: Uvula midline. No oropharyngeal exudate or posterior oropharyngeal erythema.  Tonsils: No tonsillar exudate.  Eyes:     Conjunctiva/sclera: Conjunctivae normal.     Pupils: Pupils are equal, round, and reactive to light.  Cardiovascular:     Rate and Rhythm: Normal rate and regular rhythm.     Heart sounds: S1 normal and S2 normal. No murmur heard. Pulmonary:     Effort: Pulmonary effort is normal. No respiratory distress.     Breath sounds: Normal breath sounds. No decreased breath sounds, wheezing, rhonchi or rales.  Abdominal:     General: Bowel sounds are normal.     Palpations: Abdomen is soft.     Tenderness: There is no abdominal tenderness.  Musculoskeletal:        General: No swelling.     Cervical back: Neck supple.  Lymphadenopathy:     Head:     Right side of head: No submental, submandibular, tonsillar, preauricular or posterior auricular adenopathy.     Left side of head: No submental, submandibular, tonsillar, preauricular or posterior auricular adenopathy.     Cervical: No cervical adenopathy.     Right cervical: No superficial cervical adenopathy.    Left cervical: No superficial cervical adenopathy.  Skin:    General: Skin is warm and dry.     Capillary Refill: Capillary refill takes less than 2 seconds.     Findings: No rash.  Neurological:     Mental Status: He is alert and oriented to person, place, and time.     Gait: Gait abnormal (Uses a cane on the right).  Psychiatric:        Mood and Affect: Mood normal.      UC Treatments / Results  Labs (all labs ordered are listed, but only abnormal results are displayed) Labs Reviewed - No data to  display  EKG   Radiology No results found.  Procedures Procedures (including critical care time)  Medications Ordered in UC Medications - No data to display  Initial Impression / Assessment and Plan / UC Course  I have reviewed the triage vital signs and the nursing notes.  Pertinent labs & imaging results that were available during my care of the patient were reviewed by me and considered in my medical decision making (see chart for details).  Plan of Care: Nasal ulcer and nasal congestion that is preventing sleep: Stop using the fluticasone  nasal spray for at least one week.  Use mupirocin ointment into right nostril nightly and may use morning and night if needed.  Try to coat the upper end of the nostril well.  May use saline spray in the nostril to moisten the nostril if needed.  Follow-up with primary care if symptoms do not improve, worsen or new symptoms occur.  I reviewed the plan of care with the patient and/or the patient's guardian.  The patient and/or guardian had time to ask questions and acknowledged that the questions were answered.  I provided instruction on symptoms or reasons to return here or to go to an ER, if symptoms/condition did not improve, worsened or if new symptoms occurred.  Final Clinical Impressions(s) / UC Diagnoses   Final diagnoses:  Nasal congestion  Nasal ulcer     Discharge Instructions      Nasal ulcer and nasal congestion that is preventing sleep: Stop using the fluticasone  nasal spray for at least one week.  Use mupirocin ointment into right nostril nightly and may use morning and night if needed.  Try to coat the upper end of the nostril well.  May use saline spray in the nostril to moisten the nostril if needed.  Follow-up with primary care if symptoms do not improve, worsen or new symptoms occur.     ED Prescriptions     Medication Sig Dispense Auth. Provider   mupirocin ointment (BACTROBAN) 2 % Apply with a Q-tip into her  right nostril nightly and may use morning and night if needed. 22 g Ival Domino, FNP      PDMP not reviewed this encounter.   Ival Domino, FNP 04/07/24 1256

## 2024-04-07 NOTE — ED Triage Notes (Addendum)
 Pt c/o nasal congestion mainly on his right side, pt reports it feels raw and burns. Pt recently completed Augmentin  and he is finishing up on the steroid pack he was prescribed.

## 2024-04-07 NOTE — Discharge Instructions (Addendum)
 Nasal ulcer and nasal congestion that is preventing sleep: Stop using the fluticasone  nasal spray for at least one week.  Use mupirocin ointment into right nostril nightly and may use morning and night if needed.  Try to coat the upper end of the nostril well.  May use saline spray in the nostril to moisten the nostril if needed.  Follow-up with primary care if symptoms do not improve, worsen or new symptoms occur.

## 2024-04-11 ENCOUNTER — Telehealth (HOSPITAL_BASED_OUTPATIENT_CLINIC_OR_DEPARTMENT_OTHER): Payer: Self-pay | Admitting: *Deleted

## 2024-04-11 ENCOUNTER — Ambulatory Visit: Payer: Self-pay

## 2024-04-11 ENCOUNTER — Other Ambulatory Visit (HOSPITAL_BASED_OUTPATIENT_CLINIC_OR_DEPARTMENT_OTHER): Payer: Self-pay | Admitting: *Deleted

## 2024-04-11 NOTE — Telephone Encounter (Signed)
 Pt is at [facility] currently. Requested pt call once discharged to follow up.

## 2024-04-11 NOTE — Telephone Encounter (Signed)
 Pt. Daughter made aware.

## 2024-04-11 NOTE — Telephone Encounter (Signed)
    Copied from CRM [id number]. Topic: Clinical - Red Word Triage >> Apr 11, 2024  7:59 AM Willma SAUNDERS wrote: Red Word that prompted transfer to Nurse Triage: Patient is having a hard time breathing, unable to catch his breath. States he has not been able to sleep the last three days since he cannot breath.

## 2024-04-12 ENCOUNTER — Telehealth (HOSPITAL_BASED_OUTPATIENT_CLINIC_OR_DEPARTMENT_OTHER): Payer: Self-pay | Admitting: *Deleted

## 2024-04-12 NOTE — Telephone Encounter (Signed)
 Copied from CRM [id number]. Topic: Clinical - Lab/Test Results >> Apr 11, 2024  4:53 PM Graeme ORN wrote: Reason for CRM:  Patient daughter called. Would like to let provider know patient was seen at ED. Imaging came back normal. ED provider is going to treat ulcer in Nostril. Does not feel like hosp follow up appt is necessary. Can reach out to her with any questions or concerns. Thank You    ----------------------------------------------------------------------- From previous Reason for Contact - Scheduling: Patient/patient representative is calling to schedule an appointment. Refer to attachments for appointment information.

## 2024-04-16 ENCOUNTER — Ambulatory Visit (INDEPENDENT_AMBULATORY_CARE_PROVIDER_SITE_OTHER): Admitting: Family Medicine

## 2024-04-16 ENCOUNTER — Encounter (HOSPITAL_BASED_OUTPATIENT_CLINIC_OR_DEPARTMENT_OTHER): Payer: Self-pay | Admitting: Family Medicine

## 2024-04-16 VITALS — BP 126/81 | HR 94 | Resp 18 | Wt 170.0 lb

## 2024-04-16 DIAGNOSIS — I951 Orthostatic hypotension: Secondary | ICD-10-CM | POA: Diagnosis not present

## 2024-04-16 DIAGNOSIS — J3489 Other specified disorders of nose and nasal sinuses: Secondary | ICD-10-CM | POA: Diagnosis not present

## 2024-04-16 MED ORDER — FLUDROCORTISONE ACETATE 0.1 MG PO TABS: 0.1000 mg | ORAL_TABLET | Freq: Every day | ORAL | 3 refills | Status: AC

## 2024-04-16 NOTE — Assessment & Plan Note (Signed)
 Unknown etiology.  Needs  Nasolaryngoscopy ASAP.  I called Dr. Renelda office and did get the patient an appointment in 2 weeks.  His daughter was updated.  I'm not convinced more Prednisone  or antibiotics will be useful at this time.

## 2024-04-16 NOTE — Assessment & Plan Note (Signed)
 Gently increase salt intake.  Maintain hydration.  Consider support stockings.  Dr. Krasowski agrees with trial of Florinef .  Fall precautions.

## 2024-04-16 NOTE — Progress Notes (Signed)
 Established Patient Office Visit  Subjective   Patient ID: Ryan Ball, male    DOB: 01/04/1931  Age: 89 y.o. MRN: [id number]  Chief Complaint  Patient presents with   nasel discomfort     Nasal Discomfort     F/u as above.  Please see previous notes for details.  Patient has struggled for weeks with right nasal obstruction and secondary difficulties with breathing and sleeping.  Left nare is patent.  Recent unremarkable sinus CT noted.  No fever, sinus pain, or foul drainage.  No concerns about foreign bodies.  Has been off of nasal sprays for weeks.  Previous Medrol  pack didn't seem to help much.  High priority ENT referral about 2 weeks ago was thwarted when they called the patient's phone and he thought it was spam.  On an unrelated note, he also has worsening Orthostatic symptoms despite doing a fair job with staying hydrated and consuming a reasonable amount of salt.  He has a nonvertiginous dizziness that is only an issue when he stands.  Recent labs noted at [facility] and not repeated.    Past Medical History:  Diagnosis Date   Anemia due to antineoplastic chemotherapy 04/13/2020   CAD (coronary artery disease)    f/by Dr. Bernie   Cardiomegaly 06/08/2016   Diffuse large B-cell lymphoma (HCC) 10/30/2019   f/by Oncology locally   Dilated cardiomyopathy (HCC) ejection fraction 45% in summer 2021 by echo 05/20/2020   Esophageal reflux 10/10/2018   Hypercholesteremia 10/10/2018   Nonrheumatic aortic (valve) stenosis    Osteoarthritis     Outpatient Encounter Medications as of 04/16/2024  Medication Sig   doxepin (SINEQUAN) 25 MG capsule Take 1 capsule by mouth at bedtime as needed (Sleeping).   esomeprazole (NEXIUM) 40 MG capsule daily at 6 (six) AM.   fludrocortisone  (FLORINEF ) 0.1 MG tablet Take 1 tablet (0.1 mg total) by mouth daily.   fluticasone  (FLONASE ) 50 MCG/ACT nasal spray Place 2 sprays into both nostrils daily.   methylPREDNISolone  (MEDROL  DOSEPAK) 4 MG  TBPK tablet 6 day dosepack as directed   mupirocin  ointment (BACTROBAN ) 2 % Apply with a Q-tip into her right nostril nightly and may use morning and night if needed.   REPLESTA 1.25 MG (50000 UT) WAFR Inject 5,000 Units into the skin every 14 (fourteen) days.   rosuvastatin (CRESTOR) 5 MG tablet Take 1 tablet by mouth every other day.    No facility-administered encounter medications on file as of 04/16/2024.    Social History   Tobacco Use   Smoking status: Never   Smokeless tobacco: Never  Substance Use Topics   Alcohol use: Yes    Alcohol/week: 6.0 standard drinks of alcohol    Types: 6 Glasses of wine per week    Comment: 1-2 glasses of wine 2-3 times a week   Drug use: Never      Review of Systems  Constitutional:  Positive for malaise/fatigue. Negative for diaphoresis, fever and weight loss.  Respiratory:  Negative for cough, shortness of breath and wheezing.   Cardiovascular:  Negative for chest pain, palpitations, orthopnea, claudication, leg swelling and PND.      Objective:     BP 126/81 (BP Location: Right Arm, Patient Position: Standing, Cuff Size: Normal)   Pulse 94   Resp 18   Wt 170 lb (77.1 kg)   BMI 28.32 kg/m    Physical Exam Constitutional:      General: He is not in acute distress.    Appearance:  He is not ill-appearing, toxic-appearing or diaphoretic.     Comments: Walking with a cane now.  Nontoxic, nondyspneic.  HENT:     Right Ear: Tympanic membrane normal.     Left Ear: Tympanic membrane normal.     Nose: Congestion present. No rhinorrhea.     Right Sinus: No maxillary sinus tenderness or frontal sinus tenderness.     Left Sinus: No maxillary sinus tenderness or frontal sinus tenderness.     Comments: Congestion only in right nare.  No visible FB.  No foul drainage.  Sinuses are nontender.  I don't see an ulcer.    Mouth/Throat:     Pharynx: Oropharynx is clear. No oropharyngeal exudate or posterior oropharyngeal erythema.  Eyes:      Conjunctiva/sclera: Conjunctivae normal.  Cardiovascular:     Rate and Rhythm: Normal rate and regular rhythm.     Heart sounds: Normal heart sounds.  Pulmonary:     Breath sounds: Normal breath sounds.  Abdominal:     Palpations: Abdomen is soft.     Tenderness: There is no abdominal tenderness.  Musculoskeletal:     Cervical back: Normal range of motion and neck supple. No tenderness.  Lymphadenopathy:     Cervical: No cervical adenopathy.  Skin:    General: Skin is warm and dry.     Findings: No rash.  Neurological:     General: No focal deficit present.     Mental Status: He is alert.      No results found for any visits on 04/16/24.    The ASCVD Risk score (Arnett DK, et al., 2019) failed to calculate for the following reasons:   The 2019 ASCVD risk score is only valid for ages 65 to 39    Assessment & Plan:  Nasal obstruction Assessment & Plan: Unknown etiology.  Needs  Nasolaryngoscopy ASAP.  I called Dr. Renelda office and did get the patient an appointment in 2 weeks.  His daughter was updated.  I'm not convinced more Prednisone  or antibiotics will be useful at this time.  Orders: -     Ambulatory referral to ENT  Orthostatic hypotension Assessment & Plan: Gently increase salt intake.  Maintain hydration.  Consider support stockings.  Dr. Krasowski agrees with trial of Florinef .  Fall precautions.  Orders: -     Fludrocortisone  Acetate; Take 1 tablet (0.1 mg total) by mouth daily.  Dispense: 30 tablet; Refill: 3  I personally spent a total of 25 minutes in the care of the patient today including performing a medically appropriate exam/evaluation, counseling and educating, documenting clinical information in the EHR, and communicating results.   No follow-ups on file.    REDDING PONCE NORLEEN FALCON., MD

## 2024-05-08 ENCOUNTER — Institutional Professional Consult (permissible substitution) (INDEPENDENT_AMBULATORY_CARE_PROVIDER_SITE_OTHER): Admitting: Otolaryngology

## 2024-06-07 ENCOUNTER — Ambulatory Visit (HOSPITAL_BASED_OUTPATIENT_CLINIC_OR_DEPARTMENT_OTHER)

## 2024-06-13 ENCOUNTER — Encounter: Payer: Self-pay | Admitting: Cardiology

## 2024-06-13 ENCOUNTER — Ambulatory Visit: Admitting: Cardiology

## 2024-06-13 VITALS — BP 120/60 | HR 76 | Ht 69.0 in | Wt 171.0 lb

## 2024-06-13 DIAGNOSIS — I350 Nonrheumatic aortic (valve) stenosis: Secondary | ICD-10-CM | POA: Diagnosis not present

## 2024-06-13 DIAGNOSIS — I420 Dilated cardiomyopathy: Secondary | ICD-10-CM | POA: Diagnosis not present

## 2024-06-13 DIAGNOSIS — I2510 Atherosclerotic heart disease of native coronary artery without angina pectoris: Secondary | ICD-10-CM | POA: Diagnosis not present

## 2024-06-13 DIAGNOSIS — C8331 Diffuse large B-cell lymphoma, lymph nodes of head, face, and neck: Secondary | ICD-10-CM

## 2024-06-13 NOTE — Patient Instructions (Signed)
 Medication Instructions:  Your physician recommends that you continue on your current medications as directed. Please refer to the Current Medication list given to you today.  *If you need a refill on your cardiac medications before your next appointment, please call your pharmacy*   Lab Work: None Ordered If you have labs (blood work) drawn today and your tests are completely normal, you will receive your results only by: MyChart Message (if you have MyChart) OR A paper copy in the mail If you have any lab test that is abnormal or we need to change your treatment, we will call you to review the results.   Testing/Procedures: None Ordered   Follow-Up: At [facility], you and your health needs are our priority.  As part of our continuing mission to provide you with exceptional heart care, we have created designated Provider Care Teams.  These Care Teams include your primary Cardiologist (physician) and Advanced Practice Providers (APPs -  Physician Assistants and Nurse Practitioners) who all work together to provide you with the care you need, when you need it.  We recommend signing up for the patient portal called MyChart.  Sign up information is provided on this After Visit Summary.  MyChart is used to connect with patients for Virtual Visits (Telemedicine).  Patients are able to view lab/test results, encounter notes, upcoming appointments, etc.  Non-urgent messages can be sent to your provider as well.   To learn more about what you can do with MyChart, go to [email or web address].    Your next appointment:   5 month(s)  The format for your next appointment:   In Person  Provider:   Lamar Fitch, MD    Other Instructions NA

## 2024-06-13 NOTE — Progress Notes (Signed)
 " Cardiology Office Note:    Date:  06/13/2024   ID:  Ryan Ball [place], DOB 10-04-1930, MRN [id number]  PCP:  Ryan Norleen PHEBE PONCE, MD  Cardiologist:  Ryan Fitch, MD    Referring MD: Ryan Norleen PHEBE PONCE, MD   Chief Complaint  Patient presents with   Follow-up    History of Present Illness:     Ryan Ball is a 89 y.o. 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  Comes today to my office for follow-up overall he says he is doing great he denies have any chest pain tightness squeezing pressure matrices he stop some medication including isosorbide  but he is feeling well.  Past Medical History:  Diagnosis Date   Anemia due to antineoplastic chemotherapy 04/13/2020   CAD (coronary artery disease)    f/by Dr. Fitch   Cardiomegaly 06/08/2016   Diffuse large B-cell lymphoma (HCC) 10/30/2019   f/by Oncology locally   Dilated cardiomyopathy (HCC) ejection fraction 45% in summer 2021 by echo 05/20/2020   Esophageal reflux 10/10/2018   Hypercholesteremia 10/10/2018   Nonrheumatic aortic (valve) stenosis    Osteoarthritis     Past Surgical History:  Procedure Laterality Date   CHOLECYSTECTOMY      Current Medications: Active Medications[1]   Allergies:   Patient has no known allergies.   Social History   Socioeconomic History   Marital status: Widowed    Spouse name: Not on file   Number of children: Not on file   Years of education: Not on file   Highest education level: Not on file  Occupational History   Not on file  Tobacco Use   Smoking status: Never   Smokeless tobacco: Never  Substance and Sexual Activity   Alcohol use: Yes    Alcohol/week: 6.0 standard drinks of alcohol    Types: 6 Glasses of wine per  week    Comment: 1-2 glasses of wine 2-3 times a week   Drug use: Never   Sexual activity: Not on file  Other Topics Concern   Not on file  Social History Narrative   Not on file   Social Drivers of Health   Tobacco Use: Low Risk (06/13/2024)   Patient History    Smoking Tobacco Use: Never    Smokeless Tobacco Use: Never    Passive Exposure: Not on file  Financial Resource Strain: Not on file  Food Insecurity: Not on file  Transportation Needs: Not on file  Physical Activity: Not on file  Stress: Not on file  Social Connections: Not on file  Depression (PHQ2-9): Low Risk (03/05/2024)   Depression (PHQ2-9)    PHQ-2 Score: 1  Alcohol Screen: Not on file  Housing: Not on file  Utilities: Not on file  Health Literacy: Not on file     Family History: The patient's family history includes CAD in his brother and father. ROS:   Please see the history of present illness.    All 14 point review of systems negative except as described per history of present illness  EKGs/Labs/Other Studies Reviewed:         Recent Labs: 04/02/2024: ALT 13; BUN 12; Creatinine, Ser 1.00; Hemoglobin 12.2; Platelets 329; Potassium 4.1; Sodium 141; TSH 3.550  Recent Lipid Panel No results found for: CHOL, TRIG, HDL, CHOLHDL, VLDL, LDLCALC, LDLDIRECT  Physical Exam:    VS:  BP 120/60 (BP Location: Left Arm, Patient Position: Sitting)   Pulse 76   Ht 5' 9 (1.753 m)   Wt 171 lb (77.6 kg)   SpO2 95%   BMI 25.25 kg/m     Wt Readings from Last 3 Encounters:  06/13/24 171 lb (77.6 kg)  04/16/24 170 lb (77.1 kg)  04/02/24 169 lb 6.4 oz (76.8 kg)     GEN:  Well nourished, well developed in no acute distress HEENT: Normal NECK: No JVD; No carotid bruits LYMPHATICS: No lymphadenopathy CARDIAC: RRR, systolic ejection murmur grade 2/6 posterior right upper sternal, no rubs, no gallops RESPIRATORY:  Clear to auscultation without rales, wheezing or rhonchi  ABDOMEN: Soft,  non-tender, non-distended MUSCULOSKELETAL:  No edema; No deformity  SKIN: Warm and dry LOWER EXTREMITIES: no swelling NEUROLOGIC:  Alert and oriented x 3 PSYCHIATRIC:  Normal affect   ASSESSMENT:    1. Dilated cardiomyopathy (HCC) ejection fraction 45% in summer 2021 by echo   2. Coronary artery disease involving native coronary artery of native heart without angina pectoris   3. Nonrheumatic aortic valve stenosis   4. Diffuse large B-cell lymphoma of lymph nodes of neck (HCC)    PLAN:    In order of problems listed above:  Dilated cardiomyopathy, ejection fraction mildly reduced I want him again to have echocardiogram he does not want to do it he said he feels fine and he is doing well. Coronary disease with mildly abnormal stress test but he is completely asymptomatic, does not want to do anything about it.  His isosorbide . Nonrheumatic aortic valve stenosis again echocardiogram was offered he is reluctant he said he will do it next time. Diffuse large B-cell lymphoma doing well from that point review. He is fine to go back to his exercises he walked on the regular basis, ask him to follow-up with me if he develops some symptoms otherwise I will see him back 6 months   Medication Adjustments/Labs and Tests Ordered: Current medicines are reviewed at length with the patient today.  Concerns regarding medicines are outlined above.  No orders of the defined types were placed in this encounter.  Medication changes: No orders of the defined types were placed in this encounter.   Signed, Ryan DOROTHA Fitch, MD, [facility] 06/13/2024 2:47 PM    [facility]    [1]  Current Meds  Medication Sig   esomeprazole (NEXIUM) 40 MG capsule daily at 6 (six) AM.   fluticasone  (FLONASE ) 50 MCG/ACT nasal spray Place 2 sprays into both nostrils daily.   mupirocin  ointment (BACTROBAN ) 2 % Apply with a Q-tip into her right nostril nightly and may use morning and night if needed.    rosuvastatin  (CRESTOR ) 5 MG tablet Take 1 tablet by mouth every other day.    "

## 2024-06-15 ENCOUNTER — Other Ambulatory Visit (HOSPITAL_BASED_OUTPATIENT_CLINIC_OR_DEPARTMENT_OTHER): Payer: Self-pay | Admitting: *Deleted

## 2024-06-15 MED ORDER — ROSUVASTATIN CALCIUM 5 MG PO TABS: 5.0000 mg | ORAL_TABLET | ORAL | 3 refills | Status: AC

## 2024-06-25 ENCOUNTER — Ambulatory Visit (HOSPITAL_BASED_OUTPATIENT_CLINIC_OR_DEPARTMENT_OTHER): Admitting: Family Medicine

## 2024-07-10 ENCOUNTER — Ambulatory Visit: Admitting: Cardiology

## 2024-07-19 ENCOUNTER — Other Ambulatory Visit (HOSPITAL_BASED_OUTPATIENT_CLINIC_OR_DEPARTMENT_OTHER): Admitting: Radiology

## 2024-07-24 ENCOUNTER — Other Ambulatory Visit

## 2024-07-24 ENCOUNTER — Ambulatory Visit: Admitting: Oncology
